# Patient Record
Sex: Male | Born: 1945 | Race: White | Hispanic: No | Marital: Married | Smoking: Never smoker
Health system: Southern US, Community
[De-identification: ages and names within clinical notes are randomized; demographics above are authoritative.]

## PROBLEM LIST (undated history)

## (undated) DIAGNOSIS — I428 Other cardiomyopathies: Secondary | ICD-10-CM

## (undated) DIAGNOSIS — L732 Hidradenitis suppurativa: Secondary | ICD-10-CM

## (undated) DIAGNOSIS — E039 Hypothyroidism, unspecified: Secondary | ICD-10-CM

## (undated) DIAGNOSIS — Z87438 Personal history of other diseases of male genital organs: Secondary | ICD-10-CM

## (undated) DIAGNOSIS — Z9884 Bariatric surgery status: Secondary | ICD-10-CM

## (undated) HISTORY — PX: ECHO DOPPLER COMPLETE(TRANSTHOR) (ARMC HX): HXRAD1288

## (undated) HISTORY — PX: CARDIOVASCULAR STRESS TEST: SHX262

## (undated) HISTORY — DX: Hidradenitis suppurativa: L73.2

## (undated) HISTORY — DX: Personal history of other diseases of male genital organs: Z87.438

## (undated) HISTORY — PX: THYROIDECTOMY: SHX17

## (undated) HISTORY — DX: Other cardiomyopathies: I42.8

## (undated) HISTORY — DX: Hypothyroidism, unspecified: E03.9

## (undated) HISTORY — PX: GASTRIC BYPASS: SHX52

## (undated) HISTORY — DX: Bariatric surgery status: Z98.84

---

## 2010-10-09 HISTORY — PX: HYDROCELE EXCISION: SHX482

## 2018-02-05 ENCOUNTER — Telehealth: Payer: Self-pay | Admitting: Cardiovascular Disease

## 2018-02-05 NOTE — Telephone Encounter (Signed)
Records rec from [facility]-placed in Chart Prep.

## 2018-03-11 DIAGNOSIS — I428 Other cardiomyopathies: Secondary | ICD-10-CM | POA: Insufficient documentation

## 2018-04-01 ENCOUNTER — Ambulatory Visit: Payer: Self-pay | Admitting: Cardiovascular Disease

## 2018-12-26 ENCOUNTER — Encounter: Payer: Self-pay | Admitting: Cardiovascular Disease

## 2018-12-27 ENCOUNTER — Encounter: Payer: Self-pay | Admitting: Cardiovascular Disease

## 2018-12-30 ENCOUNTER — Telehealth: Payer: Self-pay | Admitting: Cardiovascular Disease

## 2018-12-30 NOTE — Telephone Encounter (Signed)
Medical records requested from [facility]. 12/30/18 vlm

## 2019-01-01 ENCOUNTER — Telehealth: Payer: Self-pay

## 2019-01-01 NOTE — Telephone Encounter (Signed)
   Primary Cardiologist:  No primary care provider on file.   Patient contacted.  History reviewed.  No symptoms to suggest any unstable cardiac conditions.  Based on discussion, with current pandemic situation, we will be postponing this appointment for [name] with a plan for f/u in  wks or sooner if feasible/necessary.  If symptoms change, he has been instructed to contact our office.   Routing to C19 CANCEL pool for tracking (P CV DIV CV19 CANCEL - reason for visit "other.") and assigning priority (1 = 4-6 wks, 2 = 6-12 wks, 3 = >12 wks).   Jones Broom, CMA  01/01/2019 2:17 PM         .

## 2019-01-06 ENCOUNTER — Ambulatory Visit: Payer: Self-pay | Admitting: Cardiovascular Disease

## 2019-08-18 ENCOUNTER — Other Ambulatory Visit: Payer: Self-pay

## 2019-08-18 ENCOUNTER — Encounter: Payer: Self-pay | Admitting: Cardiovascular Disease

## 2019-08-18 ENCOUNTER — Ambulatory Visit (INDEPENDENT_AMBULATORY_CARE_PROVIDER_SITE_OTHER): Payer: Medicare Other | Admitting: Cardiovascular Disease

## 2019-08-18 VITALS — BP 102/58 | HR 71 | Ht 71.0 in | Wt 252.4 lb

## 2019-08-18 DIAGNOSIS — I428 Other cardiomyopathies: Secondary | ICD-10-CM | POA: Diagnosis not present

## 2019-08-18 DIAGNOSIS — I5022 Chronic systolic (congestive) heart failure: Secondary | ICD-10-CM | POA: Diagnosis not present

## 2019-08-18 MED ORDER — LISINOPRIL 10 MG PO TABS: 10.0000 mg | ORAL_TABLET | Freq: Every day | ORAL | 3 refills | Status: DC

## 2019-08-18 MED ORDER — CARVEDILOL 3.125 MG PO TABS: 3.1250 mg | ORAL_TABLET | Freq: Two times a day (BID) | ORAL | 3 refills | Status: DC

## 2019-08-18 NOTE — Progress Notes (Signed)
Cardiology Office Note:    Date:  08/18/2019   ID:  Steven Beard, DOB [birth year], MRN [id number]  PCP:  Patient, No Pcp Per  Cardiologist:    Electrophysiologist:  None   Referring MD: Roetta Sessions, MD   Chief Complaint  Patient presents with  . Congestive Heart Failure    History of Present Illness:    Steven Beard is a 73 y.o. male with a hx of nonischemic cardiomyopathy.  His ejection fraction is around 35% by echocardiogram in 2019.  He recently moved from [place] and is transferring cardiology care. We were asked to see him today for follow up of his CHF by Dr. Benetta Spar.   Also has borderline DM.   HbA1C is around 7 .   Able to do his normal activities.  He is not very active Does not exercise much at all .   Lets are very weak .  Has difficulty getting out of a chair without using his arms.   Has trouble when he stands for a while - gets weak, nauseated.     Past Medical History:  Diagnosis Date  . Hidradenitis   . History of BPH    benign prostatic hypertrophy  . History of hydrocele    in right testicle   . Hypothyroidism   . NICM (nonischemic cardiomyopathy) ([place])   . S/P gastric bypass     Past Surgical History:  Procedure Laterality Date  . CARDIOVASCULAR STRESS TEST     CARDIOLYTE ST LATE 2000's AT [facility] OFFICE REPORTEDLY NEGATIVE FOR ISCHEMIA  . ECHO DOPPLER COMPLETE(TRANSTHOR) ([facility] HX)     11/2010 HT ECHO W/COLORFLOW/SPECTRAL ([facility] HISTORICAL RESULT) LVEF 35%, GLOBAL HK, MILD MR/TR/AR, 2013 STABLE LVEF 35% GLOBAL HK, NO CHANGE C/W 2012, LVEF 40% WITH GLOBAL HK; TRIVIAL MR/AR, NO EFFUSION  . GASTRIC BYPASS     hx  . HYDROCELE EXCISION  2012   REPAIR    Current Medications: Current Meds  Medication Sig  . ALPRAZolam (XANAX) 1 MG tablet Take 1 tablet by mouth as needed.  [name] buPROPion (ZYBAN) 150 MG 12 hr tablet Take 150 mg by mouth 2 (two) times daily.  . carvedilol (COREG) 3.125 MG tablet Take 1 tablet (3.125 mg total) by mouth 2 (two) times  daily with a meal.  . finasteride (PROSCAR) 5 MG tablet Take 5 mg by mouth daily.  [name] levothyroxine (SYNTHROID, LEVOTHROID) 175 MCG tablet Take 175 mcg by mouth daily before breakfast.  . lisinopril (ZESTRIL) 10 MG tablet Take 1 tablet (10 mg total) by mouth daily.  . Multiple Vitamin (MULTIVITAMIN) tablet Take 1 tablet by mouth daily.  . Multiple Vitamins-Minerals (PRESERVISION AREDS) TABS Take by mouth.  . tamsulosin (FLOMAX) 0.4 MG CAPS capsule Take 0.4 mg by mouth daily.  . traZODone (DESYREL) 50 MG tablet Take 50 mg by mouth at bedtime.  . [DISCONTINUED] carvedilol (COREG) 3.125 MG tablet Take 3.125 mg by mouth 2 (two) times daily with a meal.  . [DISCONTINUED] lisinopril (PRINIVIL,ZESTRIL) 10 MG tablet Take 10 mg by mouth daily.     Allergies:   Minocycline and Codeine   Social History   Socioeconomic History  . Marital status: Married    Spouse name: Not on file  . Number of children: Not on file  . Years of education: Not on file  . Highest education level: Not on file  Occupational History  . Not on file  Social Needs  . Financial resource strain: Not on file  . Food insecurity  Worry: Not on file    Inability: Not on file  . Transportation needs    Medical: Not on file    Non-medical: Not on file  Tobacco Use  . Smoking status: Never Smoker  . Smokeless tobacco: Never Used  Substance and Sexual Activity  . Alcohol use: Yes    Frequency: Never  . Drug use: Never  . Sexual activity: Not on file  Lifestyle  . Physical activity    Days per week: Not on file    Minutes per session: Not on file  . Stress: Not on file  Relationships  . Social Herbalist on phone: Not on file    Gets together: Not on file    Attends religious service: Not on file    Active member of club or organization: Not on file    Attends meetings of clubs or organizations: Not on file    Relationship status: Not on file  Other Topics Concern  . Not on file  Social History  Narrative  . Not on file     Family History: The patient's family history includes Heart disease in his father and mother.  ROS:   Please see the history of present illness.     All other systems reviewed and are negative.  EKGs/Labs/Other Studies Reviewed:    The following studies were reviewed today:   EKG:  Nov. 9, 2020:   NSR at 81.   Voltage criteria for [facility]  Recent Labs: No results found for requested labs within last 8760 hours.  Recent Lipid Panel No results found for: CHOL, TRIG, HDL, CHOLHDL, VLDL, LDLCALC, LDLDIRECT  Physical Exam:    VS:  BP (!) 102/58   Pulse 71   Ht 5\' 11"  (1.803 m)   Wt 252 lb 6.4 oz (114.5 kg)   SpO2 97%   BMI 35.20 kg/m     Wt Readings from Last 3 Encounters:  08/18/19 252 lb 6.4 oz (114.5 kg)     GEN:   Elderly, moderately obese gentleman, no acute distress HEENT: Normal NECK: No JVD; No carotid bruits LYMPHATICS: No lymphadenopathy CARDIAC: RRR, no murmurs, rubs, gallops RESPIRATORY:  Clear to auscultation without rales, wheezing or rhonchi  ABDOMEN: Soft, non-tender, non-distended MUSCULOSKELETAL:  No edema; No deformity  SKIN: Warm and dry NEUROLOGIC:  Alert and oriented x 3 PSYCHIATRIC:  Normal affect   ASSESSMENT:    1. Chronic systolic heart failure ([place])   2. NICM (nonischemic cardiomyopathy) ([place])    PLAN:    In order of problems listed above:  1. Chronic systolic congestive heart failure: Steven Beard presents today for further evaluation management of his chronic systolic congestive heart failure.  He is on carvedilol, lisinopril.  He has some symptoms that may be concerning for orthostatic hypotension so I do not think that we can add any additional medications at this time.  He limits his salt intake.  He admits that he does not drink enough water and we discussed this at great length.  We will repeat his echocardiogram.  His last ejection fraction was 35% last year in [place], [place].  His last echocardiogram  showed an ejection fraction of 35%.  We will repeat his echocardiogram.  2.  Generalized deconditioning: He has trouble getting up out of a chair without using his arms.  We discussed the importance of being able to get up out of a chair or off the toilet independently.  We will refer him to the PREP  program at [facility].    We will see him again in 3 months for follow-up visit.   Medication Adjustments/Labs and Tests Ordered: Current medicines are reviewed at length with the patient today.  Concerns regarding medicines are outlined above.  Orders Placed This Encounter  Procedures  . Amb Referral To Provider Referral Exercise Program (P.R.E.P)  . EKG 12-Lead  . ECHOCARDIOGRAM COMPLETE   Meds ordered this encounter  Medications  . carvedilol (COREG) 3.125 MG tablet    Sig: Take 1 tablet (3.125 mg total) by mouth 2 (two) times daily with a meal.    Dispense:  180 tablet    Refill:  3  . lisinopril (ZESTRIL) 10 MG tablet    Sig: Take 1 tablet (10 mg total) by mouth daily.    Dispense:  90 tablet    Refill:  3    Patient Instructions  Medication Instructions:  Your physician recommends that you continue on your current medications as directed. Please refer to the Current Medication list given to you today.  *If you need a refill on your cardiac medications before your next appointment, please call your pharmacy*   Lab Work: Please Fax your lab results to [phone]    Testing/Procedures: Your physician has requested that you have an echocardiogram. Echocardiography is a painless test that uses sound waves to create images of your heart. It provides your doctor with information about the size and shape of your heart and how well your heart's chambers and valves are working. This procedure takes approximately one hour. There are no restrictions for this procedure.    Follow-Up: You have been referred to Provider Referral Exercise Program at the [facility] will receive a call  regarding admission into this program   At [facility], you and your health needs are our priority.  As part of our continuing mission to provide you with exceptional heart care, we have created designated Provider Care Teams.  These Care Teams include your primary Cardiologist (physician) and Advanced Practice Providers (APPs -  Physician Assistants and Nurse Practitioners) who all work together to provide you with the care you need, when you need it.  Your next appointment:   3 months  The format for your next appointment:   Either In Person or Virtual  Provider:   You may see Mertie Moores, MD or one of the following Advanced Practice Providers on your designated Care Team:    Richardson Dopp, PA-C  Vin [place], [place]  Daune Perch, [place]       Signed, Mertie Moores, MD  08/18/2019 6:08 PM    [place]

## 2019-08-18 NOTE — Patient Instructions (Addendum)
Medication Instructions:  Your physician recommends that you continue on your current medications as directed. Please refer to the Current Medication list given to you today.  *If you need a refill on your cardiac medications before your next appointment, please call your pharmacy*   Lab Work: Please Fax your lab results to [phone]    Testing/Procedures: Your physician has requested that you have an echocardiogram. Echocardiography is a painless test that uses sound waves to create images of your heart. It provides your doctor with information about the size and shape of your heart and how well your heart's chambers and valves are working. This procedure takes approximately one hour. There are no restrictions for this procedure.    Follow-Up: You have been referred to Provider Referral Exercise Program at the [facility] will receive a call regarding admission into this program   At [facility], you and your health needs are our priority.  As part of our continuing mission to provide you with exceptional heart care, we have created designated Provider Care Teams.  These Care Teams include your primary Cardiologist (physician) and Advanced Practice Providers (APPs -  Physician Assistants and Nurse Practitioners) who all work together to provide you with the care you need, when you need it.  Your next appointment:   3 months  The format for your next appointment:   Either In Person or Virtual  Provider:   You may see Mertie Moores, MD or one of the following Advanced Practice Providers on your designated Care Team:    Richardson Dopp, PA-C  [place], [place]  Daune Perch, [place]

## 2019-08-26 ENCOUNTER — Telehealth: Payer: Self-pay

## 2019-08-26 NOTE — Telephone Encounter (Signed)
Received referral for PREP. Patient has concerns about the rising numbers of COVID in the state. Prefers to wait to see what will happen first before committing. Did talk with patient about ways to be active in the home such as standing up from chair several times per day,walks and states MD did tell him to use stairs for exercise. Has a stationary bike. Offered a virtural class instead but declined. Agreeable to a phone call next week for accountability to movement.

## 2019-08-27 ENCOUNTER — Other Ambulatory Visit: Payer: Self-pay

## 2019-08-27 ENCOUNTER — Ambulatory Visit (HOSPITAL_COMMUNITY): Payer: Medicare Other | Attending: Cardiovascular Disease

## 2019-08-27 DIAGNOSIS — I5022 Chronic systolic (congestive) heart failure: Secondary | ICD-10-CM | POA: Insufficient documentation

## 2019-08-27 DIAGNOSIS — I428 Other cardiomyopathies: Secondary | ICD-10-CM | POA: Diagnosis present

## 2019-08-27 MED ORDER — PERFLUTREN LIPID MICROSPHERE
1.0000 mL | INTRAVENOUS | Status: AC | PRN
  Administered 2019-08-27: 3 mL via INTRAVENOUS

## 2019-08-28 ENCOUNTER — Encounter: Payer: Self-pay | Admitting: Cardiovascular Disease

## 2019-08-28 ENCOUNTER — Telehealth: Payer: Self-pay | Admitting: Nurse Practitioner

## 2019-08-28 DIAGNOSIS — I5022 Chronic systolic (congestive) heart failure: Secondary | ICD-10-CM

## 2019-08-28 MED ORDER — ENTRESTO 24-26 MG PO TABS: 1.0000 | ORAL_TABLET | Freq: Two times a day (BID) | ORAL | 11 refills | Status: DC

## 2019-08-28 NOTE — Telephone Encounter (Signed)
Spoke with patient and reviewed Dr. Elmarie Shiley advice. He agrees to stop lisinopril and start Entresto 36 hours later. Patient states he will monitor BP regularly and will come in for lab work on 12/15. I advised him to call in anytime with questions or concerns. Questions were answered to his satisfaction and he thanked me for the call.

## 2019-08-28 NOTE — Telephone Encounter (Signed)
-----   Message from Thayer Headings, MD sent at 08/27/2019  2:05 PM EST ----- LV function is unchanged from previous echo His reduced exertional capacity is likely due to deconditioning

## 2019-08-28 NOTE — Telephone Encounter (Signed)
Called patient to review echo results. He states his previous EF was 35-40% and it has not been this low in the past. He states cardiologist in [place] wanted to increase his carvedilol in the past but did not think his soft BP would tolerate. I advised that our advice would not be an increase in the BB but rather to try Entresto in the place of lisinopril. I answered his questions about the medication and advised that I will review with Dr. Acie Fredrickson and call him back. Patient verbalized understanding and agreement with plan. Discussed plan with Dr. Acie Fredrickson who agrees that patient should stop lisinopril and start Entresto 24-26 mg BID 36 hours later.  I left patient a message requesting a call back

## 2019-09-01 NOTE — Progress Notes (Signed)
Follow up after PREP referral Spoke with pt and wife today. Pt prefers to continue to exercise at home given COVID concerns. Is exercising (walking) every day.  Encouraged to keep the habit up as well as to reach out if he would like education supports or virtual exercise.  Will keep patient on list and reach back out once restrictions are lifted.

## 2019-09-23 ENCOUNTER — Other Ambulatory Visit: Payer: Self-pay

## 2019-09-23 ENCOUNTER — Other Ambulatory Visit: Payer: Medicare Other

## 2019-09-23 DIAGNOSIS — I5022 Chronic systolic (congestive) heart failure: Secondary | ICD-10-CM

## 2019-09-23 LAB — BASIC METABOLIC PANEL
BUN/Creatinine Ratio: 20 (ref 10–24)
BUN: 25 mg/dL (ref 8–27)
CO2: 24 mmol/L (ref 20–29)
Calcium: 9.3 mg/dL (ref 8.6–10.2)
Chloride: 102 mmol/L (ref 96–106)
Creatinine, Ser: 1.25 mg/dL (ref 0.76–1.27)
GFR calc Af Amer: 66 mL/min/{1.73_m2} (ref >59)
GFR calc non Af Amer: 57 mL/min/{1.73_m2} — ABNORMAL LOW (ref >59)
Glucose: 177 mg/dL — ABNORMAL HIGH (ref 65–99)
Potassium: 4.9 mmol/L (ref 3.5–5.2)
Sodium: 141 mmol/L (ref 134–144)

## 2019-11-01 ENCOUNTER — Ambulatory Visit: Payer: Medicare Other | Attending: Internal Medicine

## 2019-11-01 DIAGNOSIS — Z23 Encounter for immunization: Secondary | ICD-10-CM | POA: Insufficient documentation

## 2019-11-01 NOTE — Progress Notes (Signed)
   Covid-19 Vaccination Clinic  Name:  Steven Beard    MRN: [id number] DOB: 1946-10-06  11/01/2019  Steven Beard was observed post Covid-19 immunization for 15 minutes without incidence. He was provided with Vaccine Information Sheet and instruction to access the V-Safe system.   Steven Beard was instructed to call 911 with any severe reactions post vaccine: [name] Difficulty breathing  . Swelling of your face and throat  . A fast heartbeat  . A bad rash all over your body  . Dizziness and weakness    Immunizations Administered    Name Date Dose VIS Date Route   Pfizer COVID-19 Vaccine 11/01/2019  2:14 PM 0.3 mL 09/19/2019 Intramuscular   Manufacturer: [place]   Lot: [id number]   Gene Autry: [id number]

## 2019-11-18 ENCOUNTER — Ambulatory Visit (INDEPENDENT_AMBULATORY_CARE_PROVIDER_SITE_OTHER): Payer: Medicare Other | Admitting: Cardiovascular Disease

## 2019-11-18 ENCOUNTER — Encounter: Payer: Self-pay | Admitting: Cardiovascular Disease

## 2019-11-18 ENCOUNTER — Other Ambulatory Visit: Payer: Self-pay

## 2019-11-18 VITALS — BP 100/60 | HR 88 | Ht 71.0 in | Wt 253.0 lb

## 2019-11-18 DIAGNOSIS — I5042 Chronic combined systolic (congestive) and diastolic (congestive) heart failure: Secondary | ICD-10-CM

## 2019-11-18 DIAGNOSIS — I5022 Chronic systolic (congestive) heart failure: Secondary | ICD-10-CM | POA: Diagnosis not present

## 2019-11-18 DIAGNOSIS — I428 Other cardiomyopathies: Secondary | ICD-10-CM | POA: Diagnosis not present

## 2019-11-18 NOTE — Patient Instructions (Addendum)
Medication Instructions:  Your physician recommends that you continue on your current medications as directed. Please refer to the Current Medication list given to you today.  *If you need a refill on your cardiac medications before your next appointment, please call your pharmacy*  Lab Work: None Ordered If you have labs (blood work) drawn today and your tests are completely normal, you will receive your results only by: [name] MyChart Message (if you have MyChart) OR . A paper copy in the mail If you have any lab test that is abnormal or we need to change your treatment, we will call you to review the results.  Testing/Procedures: Your physician has requested that you have an echocardiogram on Monday April 19. Echocardiography is a painless test that uses sound waves to create images of your heart. It provides your doctor with information about the size and shape of your heart and how well your heart's chambers and valves are working. This procedure takes approximately one hour. There are no restrictions for this procedure.   Follow-Up: At [facility], you and your health needs are our priority.  As part of our continuing mission to provide you with exceptional heart care, we have created designated Provider Care Teams.  These Care Teams include your primary Cardiologist (physician) and Advanced Practice Providers (APPs -  Physician Assistants and Nurse Practitioners) who all work together to provide you with the care you need, when you need it.  Your next appointment:   2 month(s) on Wed. April 21 at 10:20 am  The format for your next appointment:   In Person  Provider:   Mertie Moores, MD

## 2019-11-18 NOTE — Progress Notes (Signed)
Cardiology Office Note:    Date:  11/18/2019   ID:  Steven Beard, DOB Apr 17, 1946, MRN [id number]  PCP:  Patient, No Pcp Per  Cardiologist:  Trenton Passow  Electrophysiologist:  None   Referring MD: No ref. provider found   No chief complaint on file.   History of Present Illness:    Steven Beard is a 74 y.o. male with a hx of nonischemic cardiomyopathy.  His ejection fraction is around 35% by echocardiogram in 2019.  He recently moved from [place] and is transferring cardiology care. We were asked to see him today for follow up of his CHF by Dr. Benetta Spar.   Also has borderline DM.   HbA1C is around 7 .   Able to do his normal activities.  He is not very active Does not exercise much at all .   Lets are very weak .  Has difficulty getting out of a chair without using his arms.   Has trouble when he stands for a while - gets weak, nauseated.   November 18, 2019:  [name] is seen today for follow-up of his congestive heart failure.  He is quite weak.  His blood pressure is very low today. We stopped his Lisinopril and started Entresto at his last visit.  Takes his BP on occasion Typical 110-120 / 60-70  Past Medical History:  Diagnosis Date  . Hidradenitis   . History of BPH    benign prostatic hypertrophy  . History of hydrocele    in right testicle   . Hypothyroidism   . NICM (nonischemic cardiomyopathy) ([place])   . S/P gastric bypass     Past Surgical History:  Procedure Laterality Date  . CARDIOVASCULAR STRESS TEST     CARDIOLYTE ST LATE 2000's AT [facility] OFFICE REPORTEDLY NEGATIVE FOR ISCHEMIA  . ECHO DOPPLER COMPLETE(TRANSTHOR) ([facility] HX)     11/2010 HT ECHO W/COLORFLOW/SPECTRAL ([facility] HISTORICAL RESULT) LVEF 35%, GLOBAL HK, MILD MR/TR/AR, 2013 STABLE LVEF 35% GLOBAL HK, NO CHANGE C/W 2012, LVEF 40% WITH GLOBAL HK; TRIVIAL MR/AR, NO EFFUSION  . GASTRIC BYPASS     hx  . HYDROCELE EXCISION  2012   REPAIR    Current Medications: Current Meds  Medication Sig  . ALPRAZolam  (XANAX) 1 MG tablet Take 1 tablet by mouth as needed.  [name] buPROPion (ZYBAN) 150 MG 12 hr tablet Take 150 mg by mouth 2 (two) times daily.  . carvedilol (COREG) 3.125 MG tablet Take 1 tablet (3.125 mg total) by mouth 2 (two) times daily with a meal.  . finasteride (PROSCAR) 5 MG tablet Take 5 mg by mouth daily.  [name] levothyroxine (SYNTHROID, LEVOTHROID) 175 MCG tablet Take 175 mcg by mouth daily before breakfast.  . Multiple Vitamin (MULTIVITAMIN) tablet Take 1 tablet by mouth daily.  . Multiple Vitamins-Minerals (PRESERVISION AREDS) TABS Take by mouth.  . sacubitril-valsartan (ENTRESTO) 24-26 MG Take 1 tablet by mouth 2 (two) times daily.  . tamsulosin (FLOMAX) 0.4 MG CAPS capsule Take 0.4 mg by mouth daily.  . traZODone (DESYREL) 50 MG tablet Take 50 mg by mouth at bedtime.     Allergies:   Minocycline and Codeine   Social History   Socioeconomic History  . Marital status: Married    Spouse name: Not on file  . Number of children: Not on file  . Years of education: Not on file  . Highest education level: Not on file  Occupational History  . Not on file  Tobacco Use  . Smoking status: Never Smoker  .  Smokeless tobacco: Never Used  Substance and Sexual Activity  . Alcohol use: Yes  . Drug use: Never  . Sexual activity: Not on file  Other Topics Concern  . Not on file  Social History Narrative  . Not on file   Social Determinants of Health   Financial Resource Strain:   . Difficulty of Paying Living Expenses: Not on file  Food Insecurity:   . Worried About Charity fundraiser in the Last Year: Not on file  . Ran Out of Food in the Last Year: Not on file  Transportation Needs:   . Lack of Transportation (Medical): Not on file  . Lack of Transportation (Non-Medical): Not on file  Physical Activity:   . Days of Exercise per Week: Not on file  . Minutes of Exercise per Session: Not on file  Stress:   . Feeling of Stress : Not on file  Social Connections:   . Frequency of  Communication with Friends and Family: Not on file  . Frequency of Social Gatherings with Friends and Family: Not on file  . Attends Religious Services: Not on file  . Active Member of Clubs or Organizations: Not on file  . Attends Archivist Meetings: Not on file  . Marital Status: Not on file     Family History: The patient's family history includes Heart disease in his father and mother.  ROS:   Please see the history of present illness.     All other systems reviewed and are negative.  EKGs/Labs/Other Studies Reviewed:    The following studies were reviewed today:     Recent Labs: 09/23/2019: BUN 25; Creatinine, Ser 1.25; Potassium 4.9; Sodium 141  Recent Lipid Panel No results found for: CHOL, TRIG, HDL, CHOLHDL, VLDL, LDLCALC, LDLDIRECT    Physical Exam: Blood pressure 100/60, pulse 88, height 5\' 11"  (1.803 m), weight 253 lb (114.8 kg), SpO2 96 %.  GEN:  Well nourished, well developed in no acute distress HEENT: Normal NECK: No JVD; No carotid bruits LYMPHATICS: No lymphadenopathy CARDIAC: RRR , no murmurs, rubs, gallops RESPIRATORY:  Clear to auscultation without rales, wheezing or rhonchi  ABDOMEN: Soft, non-tender, non-distended MUSCULOSKELETAL:  No edema; No deformity  SKIN: Warm and dry NEUROLOGIC:  Alert and oriented x 3  EKG:   ASSESSMENT:    1. NICM (nonischemic cardiomyopathy) ([place])   2. Chronic combined systolic and diastolic heart failure (H[facility])    PLAN:    In order of problems listed above:  1.  Chronic systolic congestive heart failure:   Is tolerating the entresto well BP is too low to consider increasing meds at this point I'll see him in 2 months , with echo. Consider increasing meds at that point   2.  Generalized deconditioning: has started walking  Doing well with ambulation, Agreed with his primary MD that he should do stairs whenever possible      Medication Adjustments/Labs and Tests Ordered: Current medicines are  reviewed at length with the patient today.  Concerns regarding medicines are outlined above.  Orders Placed This Encounter  Procedures  . ECHOCARDIOGRAM COMPLETE   No orders of the defined types were placed in this encounter.   Patient Instructions  Medication Instructions:  Your physician recommends that you continue on your current medications as directed. Please refer to the Current Medication list given to you today.  *If you need a refill on your cardiac medications before your next appointment, please call your pharmacy*  Lab Work: None  Ordered If you have labs (blood work) drawn today and your tests are completely normal, you will receive your results only by: [name] MyChart Message (if you have MyChart) OR . A paper copy in the mail If you have any lab test that is abnormal or we need to change your treatment, we will call you to review the results.  Testing/Procedures: Your physician has requested that you have an echocardiogram on Monday April 19. Echocardiography is a painless test that uses sound waves to create images of your heart. It provides your doctor with information about the size and shape of your heart and how well your heart's chambers and valves are working. This procedure takes approximately one hour. There are no restrictions for this procedure.   Follow-Up: At [facility], you and your health needs are our priority.  As part of our continuing mission to provide you with exceptional heart care, we have created designated Provider Care Teams.  These Care Teams include your primary Cardiologist (physician) and Advanced Practice Providers (APPs -  Physician Assistants and Nurse Practitioners) who all work together to provide you with the care you need, when you need it.  Your next appointment:   2 month(s) on Wed. April 21 at 10:20 am  The format for your next appointment:   In Person  Provider:   Mertie Moores, MD       Signed, Mertie Moores, MD    11/18/2019 12:43 PM    [place]

## 2019-11-22 ENCOUNTER — Ambulatory Visit: Payer: Medicare Other | Attending: Internal Medicine

## 2019-11-22 DIAGNOSIS — Z23 Encounter for immunization: Secondary | ICD-10-CM | POA: Insufficient documentation

## 2019-11-22 NOTE — Progress Notes (Signed)
   Covid-19 Vaccination Clinic  Name:  Steven Beard    MRN: [id number] DOB: Jul 07, 1946  11/22/2019  [name] was observed post Covid-19 immunization for 15 minutes without incidence. He was provided with Vaccine Information Sheet and instruction to access the V-Safe system.   [name] was instructed to call 911 with any severe reactions post vaccine: [name] Difficulty breathing  . Swelling of your face and throat  . A fast heartbeat  . A bad rash all over your body  . Dizziness and weakness    Immunizations Administered    Name Date Dose VIS Date Route   Pfizer COVID-19 Vaccine 11/22/2019  1:05 PM 0.3 mL 09/19/2019 Intramuscular   Manufacturer: [place]   Lot: [id number]   [place]: [id number]

## 2020-01-26 ENCOUNTER — Ambulatory Visit (HOSPITAL_COMMUNITY): Payer: Medicare Other | Attending: Cardiovascular Disease

## 2020-01-26 ENCOUNTER — Other Ambulatory Visit: Payer: Self-pay

## 2020-01-26 DIAGNOSIS — I428 Other cardiomyopathies: Secondary | ICD-10-CM | POA: Diagnosis not present

## 2020-01-26 DIAGNOSIS — I5042 Chronic combined systolic (congestive) and diastolic (congestive) heart failure: Secondary | ICD-10-CM | POA: Insufficient documentation

## 2020-01-28 ENCOUNTER — Encounter: Payer: Self-pay | Admitting: *Deleted

## 2020-01-28 ENCOUNTER — Other Ambulatory Visit: Payer: Self-pay

## 2020-01-28 ENCOUNTER — Encounter: Payer: Self-pay | Admitting: Cardiovascular Disease

## 2020-01-28 ENCOUNTER — Ambulatory Visit (INDEPENDENT_AMBULATORY_CARE_PROVIDER_SITE_OTHER): Payer: Medicare Other | Admitting: Cardiovascular Disease

## 2020-01-28 VITALS — BP 116/82 | HR 74 | Ht 71.0 in | Wt 252.2 lb

## 2020-01-28 DIAGNOSIS — I5022 Chronic systolic (congestive) heart failure: Secondary | ICD-10-CM | POA: Diagnosis not present

## 2020-01-28 DIAGNOSIS — I428 Other cardiomyopathies: Secondary | ICD-10-CM | POA: Diagnosis not present

## 2020-01-28 DIAGNOSIS — I5042 Chronic combined systolic (congestive) and diastolic (congestive) heart failure: Secondary | ICD-10-CM | POA: Diagnosis not present

## 2020-01-28 MED ORDER — SPIRONOLACTONE 25 MG PO TABS: 12.5000 mg | ORAL_TABLET | Freq: Every day | ORAL | 3 refills | Status: DC

## 2020-01-28 NOTE — Patient Instructions (Signed)
Medication Instructions:  1) START Spironolactone 12.5mg  once daily  *If you need a refill on your cardiac medications before your next appointment, please call your pharmacy*   Lab Work: BMET in 2 weeks   If you have labs (blood work) drawn today and your tests are completely normal, you will receive your results only by: [name] MyChart Message (if you have MyChart) OR . A paper copy in the mail If you have any lab test that is abnormal or we need to change your treatment, we will call you to review the results.   Testing/Procedures: Your physician has requested that you have a lexiscan myoview. For further information please visit [email or web address]. Please follow instruction sheet, as given.    Follow-Up: At [facility], you and your health needs are our priority.  As part of our continuing mission to provide you with exceptional heart care, we have created designated Provider Care Teams.  These Care Teams include your primary Cardiologist (physician) and Advanced Practice Providers (APPs -  Physician Assistants and Nurse Practitioners) who all work together to provide you with the care you need, when you need it.  We recommend signing up for the patient portal called "MyChart".  Sign up information is provided on this After Visit Summary.  MyChart is used to connect with patients for Virtual Visits (Telemedicine).  Patients are able to view lab/test results, encounter notes, upcoming appointments, etc.  Non-urgent messages can be sent to your provider as well.   To learn more about what you can do with MyChart, go to [email or web address].    Your next appointment:   4 month(s)  The format for your next appointment:   In Person  Provider:   You may see Mertie Moores, MD or one of the following Advanced Practice Providers on your designated Care Team:    Richardson Dopp, PA-C  [place], [place]  Daune Perch, NP    Other Instructions  You have been referred to the CHF  clinic.

## 2020-01-28 NOTE — Progress Notes (Signed)
Cardiology Office Note:    Date:  01/28/2020   ID:  Steven Beard, DOB 01/05/1946, MRN [id number]  PCP:  Patient, No Pcp Per  Cardiologist:  Nahser  Electrophysiologist:  None   Referring MD: No ref. provider found   Chief Complaint  Patient presents with  . Congestive Heart Failure    History of Present Illness:    Steven Beard is a 74 y.o. male with a hx of nonischemic cardiomyopathy.  His ejection fraction is around 35% by echocardiogram in 2019.  He recently moved from [place] and is transferring cardiology care. We were asked to see him today for follow up of his CHF by Dr. Benetta Spar.   Also has borderline DM.   HbA1C is around 7 .   Able to do his normal activities.  He is not very active Does not exercise much at all .   Lets are very weak .  Has difficulty getting out of a chair without using his arms.   Has trouble when he stands for a while - gets weak, nauseated.   November 18, 2019:  [name] is seen today for follow-up of his congestive heart failure.  He is quite weak.  His blood pressure is very low today. We stopped his Lisinopril and started Entresto at his last visit.  Takes his BP on occasion Typical 110-120 / 60-70  January 28, 2020:  [name] is seen today for follow-up of his congestive heart failure.  He was seen several months ago for a follow-up after initiating Entresto.  His blood pressure was too low to uptitrate any of his medications at that time. Echo is unchanged from previous EF Is waking more , legs are getting stronger   We discussed CHF clinic We discussed possible ICD placement   Past Medical History:  Diagnosis Date  . Hidradenitis   . History of BPH    benign prostatic hypertrophy  . History of hydrocele    in right testicle   . Hypothyroidism   . NICM (nonischemic cardiomyopathy) ([place])   . S/P gastric bypass     Past Surgical History:  Procedure Laterality Date  . CARDIOVASCULAR STRESS TEST     CARDIOLYTE ST LATE 2000's AT [facility]  OFFICE REPORTEDLY NEGATIVE FOR ISCHEMIA  . ECHO DOPPLER COMPLETE(TRANSTHOR) ([facility] HX)     11/2010 HT ECHO W/COLORFLOW/SPECTRAL ([facility] HISTORICAL RESULT) LVEF 35%, GLOBAL HK, MILD MR/TR/AR, 2013 STABLE LVEF 35% GLOBAL HK, NO CHANGE C/W 2012, LVEF 40% WITH GLOBAL HK; TRIVIAL MR/AR, NO EFFUSION  . GASTRIC BYPASS     hx  . HYDROCELE EXCISION  2012   REPAIR    Current Medications: Current Meds  Medication Sig  . ALPRAZolam (XANAX) 1 MG tablet Take 1 tablet by mouth as needed.  [name] buPROPion (ZYBAN) 150 MG 12 hr tablet Take 150 mg by mouth 2 (two) times daily.  . carvedilol (COREG) 3.125 MG tablet Take 1 tablet (3.125 mg total) by mouth 2 (two) times daily with a meal.  . finasteride (PROSCAR) 5 MG tablet Take 5 mg by mouth daily.  [name] levothyroxine (SYNTHROID, LEVOTHROID) 175 MCG tablet Take 175 mcg by mouth daily before breakfast.  . Multiple Vitamin (MULTIVITAMIN) tablet Take 1 tablet by mouth daily.  . Multiple Vitamins-Minerals (PRESERVISION AREDS) TABS Take by mouth.  . sacubitril-valsartan (ENTRESTO) 24-26 MG Take 1 tablet by mouth 2 (two) times daily.  . tamsulosin (FLOMAX) 0.4 MG CAPS capsule Take 0.4 mg by mouth daily.  . traZODone (DESYREL) 50 MG tablet Take  50 mg by mouth at bedtime.     Allergies:   Minocycline and Codeine   Social History   Socioeconomic History  . Marital status: Married    Spouse name: Not on file  . Number of children: Not on file  . Years of education: Not on file  . Highest education level: Not on file  Occupational History  . Not on file  Tobacco Use  . Smoking status: Never Smoker  . Smokeless tobacco: Never Used  Substance and Sexual Activity  . Alcohol use: Yes  . Drug use: Never  . Sexual activity: Not on file  Other Topics Concern  . Not on file  Social History Narrative  . Not on file   Social Determinants of Health   Financial Resource Strain:   . Difficulty of Paying Living Expenses:   Food Insecurity:   . Worried About Ship broker in the Last Year:   . Arboriculturist in the Last Year:   Transportation Needs:   . Film/video editor (Medical):   [name] Lack of Transportation (Non-Medical):   Physical Activity:   . Days of Exercise per Week:   . Minutes of Exercise per Session:   Stress:   . Feeling of Stress :   Social Connections:   . Frequency of Communication with Friends and Family:   . Frequency of Social Gatherings with Friends and Family:   . Attends Religious Services:   . Active Member of Clubs or Organizations:   . Attends Archivist Meetings:   [name] Marital Status:      Family History: The patient's family history includes Heart disease in his father and mother.  ROS:   Please see the history of present illness.     All other systems reviewed and are negative.  EKGs/Labs/Other Studies Reviewed:    The following studies were reviewed today:     Recent Labs: 09/23/2019: BUN 25; Creatinine, Ser 1.25; Potassium 4.9; Sodium 141  Recent Lipid Panel No results found for: CHOL, TRIG, HDL, CHOLHDL, VLDL, LDLCALC, LDLDIRECT    Physical Exam: Blood pressure 116/82, pulse 74, height 5\' 11"  (1.803 m), weight 252 lb 3.2 oz (114.4 kg), SpO2 97 %.  GEN:  Middle age male,  NAD , moderately obese HEENT: Normal NECK: No JVD; No carotid bruits LYMPHATICS: No lymphadenopathy CARDIAC: RRR , no murmurs, rubs, gallops RESPIRATORY:  Clear to auscultation without rales, wheezing or rhonchi  ABDOMEN: Soft, non-tender, non-distended MUSCULOSKELETAL:  No edema; No deformity  SKIN: Warm and dry NEUROLOGIC:  Alert and oriented x 3   EKG:   ASSESSMENT:    1. Chronic combined systolic and diastolic heart failure (HCC)    PLAN:    In order of problems listed above:  1.  Chronic systolic congestive heart failure:     His ejection fraction has not really changed despite being on Entresto.  I am not sure that we will be able to increase his Delene Loll although it much because of his  hypertension.  We will try adding spironolactone 12.5 mg a day.  Continue carvedilol 3.125 mg twice a day.  He is not had any angina but I think will be important to repeat his stress test to make sure he does not have underlying coronary artery disease.  We will refer him to the advanced heart failure clinic to see if they have any other contributions. We will also need to consider referral to the electrophysiology for placement of  an ICD if his EF does not improve.  I will see him in 4 months .  He'll likely be seen at least 1-2 times by the CHF team in the meantime.   2.  Generalized deconditioning:   He is ambulating more.  He seems to be getting stronger.   Medication Adjustments/Labs and Tests Ordered: Current medicines are reviewed at length with the patient today.  Concerns regarding medicines are outlined above.  Orders Placed This Encounter  Procedures  . Basic metabolic panel  . AMB referral to CHF clinic  . MYOCARDIAL PERFUSION IMAGING   Meds ordered this encounter  Medications  . spironolactone (ALDACTONE) 25 MG tablet    Sig: Take 0.5 tablets (12.5 mg total) by mouth daily.    Dispense:  45 tablet    Refill:  3    Patient Instructions  Medication Instructions:  1) START Spironolactone 12.5mg  once daily  *If you need a refill on your cardiac medications before your next appointment, please call your pharmacy*   Lab Work: BMET in 2 weeks   If you have labs (blood work) drawn today and your tests are completely normal, you will receive your results only by: [name] MyChart Message (if you have MyChart) OR . A paper copy in the mail If you have any lab test that is abnormal or we need to change your treatment, we will call you to review the results.   Testing/Procedures: Your physician has requested that you have a lexiscan myoview. For further information please visit [email or web address]. Please follow instruction sheet, as given.    Follow-Up: At [facility],  you and your health needs are our priority.  As part of our continuing mission to provide you with exceptional heart care, we have created designated Provider Care Teams.  These Care Teams include your primary Cardiologist (physician) and Advanced Practice Providers (APPs -  Physician Assistants and Nurse Practitioners) who all work together to provide you with the care you need, when you need it.  We recommend signing up for the patient portal called "MyChart".  Sign up information is provided on this After Visit Summary.  MyChart is used to connect with patients for Virtual Visits (Telemedicine).  Patients are able to view lab/test results, encounter notes, upcoming appointments, etc.  Non-urgent messages can be sent to your provider as well.   To learn more about what you can do with MyChart, go to [email or web address].    Your next appointment:   4 month(s)  The format for your next appointment:   In Person  Provider:   You may see Mertie Moores, MD or one of the following Advanced Practice Providers on your designated Care Team:    Richardson Dopp, PA-C  [place], [place]  Daune Perch, NP    Other Instructions  You have been referred to the CHF clinic.     Signed, Mertie Moores, MD  01/28/2020 10:45 AM    [place]

## 2020-02-03 ENCOUNTER — Telehealth (HOSPITAL_COMMUNITY): Payer: Self-pay | Admitting: *Deleted

## 2020-02-03 NOTE — Telephone Encounter (Signed)
Patient given detailed instructions per Myocardial Perfusion Study Information Sheet for the test on 02/11/2020 at 0800. Patient notified to arrive 15 minutes early and that it is imperative to arrive on time for appointment to keep from having the test rescheduled.  If you need to cancel or reschedule your appointment, please call the office within 24 hours of your appointment. . Patient verbalized understanding.Steven Beard, [place] Patient does not use mychart

## 2020-02-10 ENCOUNTER — Telehealth: Payer: Self-pay | Admitting: Cardiovascular Disease

## 2020-02-10 NOTE — Telephone Encounter (Signed)
PT called back asking about his test tomorrow. Call was transferred to [facility]

## 2020-02-10 NOTE — Telephone Encounter (Signed)
Reviewed instructions for myocardial perfusion test that patient has scheduled for tomorrow. I answered questions to his satisfaction and he verbalized understanding.   Additionally, patient reports increased weakness and nausea when he wakes up in the morning prior to taking medications since starting spironolactone. He reports he recently lost 6 lbs and has decreased his food intake. I asked about BP readings and he reports one reading of SBP <100 mmHg. I advised that he may try taking the spironolactone 12.5 mg at lunch to see if he has less symptoms in the mornings. I advised him also to monitor BP regularly so that we can have that information for future advice. He verbalized understanding and agreement with plan and thanked me for the call.

## 2020-02-10 NOTE — Telephone Encounter (Signed)
New Message  Pt called and had questions regarding his Myocardial Purfusion he's having tomorrow  Please call to discuss

## 2020-02-11 ENCOUNTER — Other Ambulatory Visit: Payer: Medicare Other | Admitting: *Deleted

## 2020-02-11 ENCOUNTER — Ambulatory Visit (HOSPITAL_COMMUNITY): Payer: Medicare Other | Attending: Cardiovascular Disease

## 2020-02-11 ENCOUNTER — Other Ambulatory Visit: Payer: Self-pay

## 2020-02-11 DIAGNOSIS — I5042 Chronic combined systolic (congestive) and diastolic (congestive) heart failure: Secondary | ICD-10-CM

## 2020-02-11 LAB — BASIC METABOLIC PANEL
BUN/Creatinine Ratio: 20 (ref 10–24)
BUN: 23 mg/dL (ref 8–27)
CO2: 26 mmol/L (ref 20–29)
Calcium: 9.5 mg/dL (ref 8.6–10.2)
Chloride: 101 mmol/L (ref 96–106)
Creatinine, Ser: 1.14 mg/dL (ref 0.76–1.27)
GFR calc Af Amer: 73 mL/min/{1.73_m2} (ref >59)
GFR calc non Af Amer: 63 mL/min/{1.73_m2} (ref >59)
Glucose: 147 mg/dL — ABNORMAL HIGH (ref 65–99)
Potassium: 4.9 mmol/L (ref 3.5–5.2)
Sodium: 139 mmol/L (ref 134–144)

## 2020-02-11 LAB — MYOCARDIAL PERFUSION IMAGING
LV dias vol: 84 mL (ref 62–150)
LV sys vol: 57 mL
Peak HR: 90 {beats}/min
Rest HR: 71 {beats}/min
SDS: 3
SRS: 1
SSS: 4
TID: 0.93

## 2020-02-11 MED ORDER — TECHNETIUM TC 99M TETROFOSMIN IV KIT
9.6000 | PACK | Freq: Once | INTRAVENOUS | Status: AC | PRN
  Administered 2020-02-11: 9.6 via INTRAVENOUS
  Filled 2020-02-11: qty 10

## 2020-02-11 MED ORDER — TECHNETIUM TC 99M TETROFOSMIN IV KIT
31.6000 | PACK | Freq: Once | INTRAVENOUS | Status: AC | PRN
  Administered 2020-02-11: 31.6 via INTRAVENOUS
  Filled 2020-02-11: qty 32

## 2020-02-11 MED ORDER — REGADENOSON 0.4 MG/5ML IV SOLN
0.4000 mg | Freq: Once | INTRAVENOUS | Status: AC
  Administered 2020-02-11: 0.4 mg via INTRAVENOUS

## 2020-05-11 ENCOUNTER — Encounter: Payer: Self-pay | Admitting: Cardiovascular Disease

## 2020-05-11 ENCOUNTER — Ambulatory Visit (INDEPENDENT_AMBULATORY_CARE_PROVIDER_SITE_OTHER): Payer: Medicare Other | Admitting: Cardiovascular Disease

## 2020-05-11 ENCOUNTER — Other Ambulatory Visit: Payer: Self-pay

## 2020-05-11 VITALS — BP 95/56 | HR 78 | Ht 71.0 in | Wt 243.8 lb

## 2020-05-11 DIAGNOSIS — I5042 Chronic combined systolic (congestive) and diastolic (congestive) heart failure: Secondary | ICD-10-CM

## 2020-05-11 LAB — BASIC METABOLIC PANEL
BUN/Creatinine Ratio: 23 (ref 10–24)
BUN: 27 mg/dL (ref 8–27)
CO2: 24 mmol/L (ref 20–29)
Calcium: 8.7 mg/dL (ref 8.6–10.2)
Chloride: 105 mmol/L (ref 96–106)
Creatinine, Ser: 1.16 mg/dL (ref 0.76–1.27)
GFR calc Af Amer: 71 mL/min/{1.73_m2} (ref >59)
GFR calc non Af Amer: 62 mL/min/{1.73_m2} (ref >59)
Glucose: 151 mg/dL — ABNORMAL HIGH (ref 65–99)
Potassium: 5.2 mmol/L (ref 3.5–5.2)
Sodium: 140 mmol/L (ref 134–144)

## 2020-05-11 LAB — CBC
Hematocrit: 36.6 % — ABNORMAL LOW (ref 37.5–51.0)
Hemoglobin: 11.9 g/dL — ABNORMAL LOW (ref 13.0–17.7)
MCH: 29.8 pg (ref 26.6–33.0)
MCHC: 32.5 g/dL (ref 31.5–35.7)
MCV: 92 fL (ref 79–97)
Platelets: 345 10*3/uL (ref 150–450)
RBC: 4 x10E6/uL — ABNORMAL LOW (ref 4.14–5.80)
RDW: 12.6 % (ref 11.6–15.4)
WBC: 7.6 10*3/uL (ref 3.4–10.8)

## 2020-05-11 MED ORDER — DIGOXIN 125 MCG PO TABS
0.1250 mg | ORAL_TABLET | Freq: Every day | ORAL | 3 refills | Status: DC
Start: 2020-05-11 — End: 2020-06-30

## 2020-05-11 NOTE — H&P (View-Only) (Signed)
Cardiology Office Note:    Date:  05/11/2020   ID:  Steven Beard, DOB 1946/02/23, MRN [id number]  PCP:  Patient, No Pcp Per  Cardiologist:  Mariacristina Aday  Electrophysiologist:  None   Referring MD: No ref. provider found   Chief Complaint  Patient presents with  . Congestive Heart Failure    History of Present Illness:    Steven Beard is a 74 y.o. male with a hx of nonischemic cardiomyopathy.  His ejection fraction is around 35% by echocardiogram in 2019.  He recently moved from [place] and is transferring cardiology care. We were asked to see him today for follow up of his CHF by Dr. Benetta Spar.   Also has borderline DM.   HbA1C is around 7 .   Able to do his normal activities.  He is not very active Does not exercise much at all .   Lets are very weak .  Has difficulty getting out of a chair without using his arms.   Has trouble when he stands for a while - gets weak, nauseated.   November 18, 2019:  [name] is seen today for follow-up of his congestive heart failure.  He is quite weak.  His blood pressure is very low today. We stopped his Lisinopril and started Entresto at his last visit.  Takes his BP on occasion Typical 110-120 / 60-70  January 28, 2020:  [name] is seen today for follow-up of his congestive heart failure.  He was seen several months ago for a follow-up after initiating Entresto.  His blood pressure was too low to uptitrate any of his medications at that time. Echo is unchanged from previous EF Is waking more , legs are getting stronger   We discussed CHF clinic We discussed possible ICD placement   May 11, 2020: Steven Beard is seen today for follow-up of his congestive heart failure.  He has been on Entresto but despite that his ejection fraction has not changed.  We referred him to the advanced heart failure clinic.  His appointment is scheduled for September 30.  He is still not feeling well  - weakness and fatigue.   No CP ,  Chronic DOE especially waking up  hills myoview in May 2021 shows no ischemia or infarction   Low risk  lprimary MD is [name] in [place].   Past Medical History:  Diagnosis Date  . Hidradenitis   . History of BPH    benign prostatic hypertrophy  . History of hydrocele    in right testicle   . Hypothyroidism   . NICM (nonischemic cardiomyopathy) ([place])   . S/P gastric bypass     Past Surgical History:  Procedure Laterality Date  . CARDIOVASCULAR STRESS TEST     CARDIOLYTE ST LATE 2000's AT [facility] OFFICE REPORTEDLY NEGATIVE FOR ISCHEMIA  . ECHO DOPPLER COMPLETE(TRANSTHOR) ([facility] HX)     11/2010 HT ECHO W/COLORFLOW/SPECTRAL ([facility] HISTORICAL RESULT) LVEF 35%, GLOBAL HK, MILD MR/TR/AR, 2013 STABLE LVEF 35% GLOBAL HK, NO CHANGE C/W 2012, LVEF 40% WITH GLOBAL HK; TRIVIAL MR/AR, NO EFFUSION  . GASTRIC BYPASS     hx  . HYDROCELE EXCISION  2012   REPAIR    Current Medications: Current Meds  Medication Sig  . ALPRAZolam (XANAX) 1 MG tablet Take 0.5 mg by mouth at bedtime as needed for anxiety.   [name] buPROPion (WELLBUTRIN SR) 150 MG 12 hr tablet Take 150 mg by mouth 2 (two) times daily.   . carvedilol (COREG) 3.125 MG tablet Take 1  tablet (3.125 mg total) by mouth 2 (two) times daily with a meal.  . finasteride (PROSCAR) 5 MG tablet Take 5 mg by mouth daily.  [name] levothyroxine (SYNTHROID, LEVOTHROID) 175 MCG tablet Take 175 mcg by mouth daily before breakfast.  . Multiple Vitamins-Minerals (PRESERVISION AREDS) TABS Take 1 tablet by mouth 2 (two) times daily.   . sacubitril-valsartan (ENTRESTO) 24-26 MG Take 1 tablet by mouth 2 (two) times daily.  [name] spironolactone (ALDACTONE) 25 MG tablet Take 0.5 tablets (12.5 mg total) by mouth daily.  . tamsulosin (FLOMAX) 0.4 MG CAPS capsule Take 0.4 mg by mouth daily.  . traZODone (DESYREL) 50 MG tablet Take 50 mg by mouth at bedtime.  . [DISCONTINUED] buPROPion (ZYBAN) 150 MG 12 hr tablet Take 150 mg by mouth 2 (two) times daily.  . [DISCONTINUED] Multiple Vitamin (MULTIVITAMIN)  tablet Take 1 tablet by mouth daily.     Allergies:   Minocycline and Codeine   Social History   Socioeconomic History  . Marital status: Married    Spouse name: Not on file  . Number of children: Not on file  . Years of education: Not on file  . Highest education level: Not on file  Occupational History  . Not on file  Tobacco Use  . Smoking status: Never Smoker  . Smokeless tobacco: Never Used  Vaping Use  . Vaping Use: Never used  Substance and Sexual Activity  . Alcohol use: Yes  . Drug use: Never  . Sexual activity: Not on file  Other Topics Concern  . Not on file  Social History Narrative  . Not on file   Social Determinants of Health   Financial Resource Strain:   . Difficulty of Paying Living Expenses:   Food Insecurity:   . Worried About Charity fundraiser in the Last Year:   . Arboriculturist in the Last Year:   Transportation Needs:   . Film/video editor (Medical):   [name] Lack of Transportation (Non-Medical):   Physical Activity:   . Days of Exercise per Week:   . Minutes of Exercise per Session:   Stress:   . Feeling of Stress :   Social Connections:   . Frequency of Communication with Friends and Family:   . Frequency of Social Gatherings with Friends and Family:   . Attends Religious Services:   . Active Member of Clubs or Organizations:   . Attends Archivist Meetings:   [name] Marital Status:      Family History: The patient's family history includes Heart disease in his father and mother.  ROS:   Please see the history of present illness.     All other systems reviewed and are negative.  EKGs/Labs/Other Studies Reviewed:    The following studies were reviewed today:     Recent Labs: 05/11/2020: BUN 27; Creatinine, Ser 1.16; Hemoglobin WILL FOLLOW; Platelets WILL FOLLOW; Potassium 5.2; Sodium 140  Recent Lipid Panel No results found for: CHOL, TRIG, HDL, CHOLHDL, VLDL, LDLCALC, LDLDIRECT    Physical Exam: Blood pressure  (!) 95/56, pulse 78, height 5\' 11"  (1.803 m), weight 243 lb 12.8 oz (110.6 kg), SpO2 97 %.  GEN:   Elderly male,  modertely obese HEENT: Normal NECK: No JVD; No carotid bruits LYMPHATICS: No lymphadenopathy CARDIAC: RRR , no murmurs, rubs, gallops RESPIRATORY:  Clear to auscultation without rales, wheezing or rhonchi  ABDOMEN: Soft, non-tender, non-distended MUSCULOSKELETAL:  No edema; No deformity  SKIN: Warm and dry NEUROLOGIC:  Alert  and oriented x 3   EKG:   ASSESSMENT:    1. Chronic combined systolic and diastolic heart failure (HCC)    PLAN:    In order of problems listed above:  1.  Chronic systolic congestive heart failure:      His ejection fraction has not improved on Entresto and Aldactone.  I would like to add digoxin 0.25 mg a day.  He had a negative Myoview study but I would like to do a right left heart catheterization since it does not make any sense that his EF did not improve on maximal medical therapy.  His blood pressure is low and we cannot increase his medications at this time.  I discussed the risks, benefits, options of heart catheterization.  He understands and agrees to proceed.  He does have a referral to the CHF clinic in September.  2.  Generalized deconditioning: We will refer him [facility].  He may need to have a formal physical therapy consult but I will defer to his primary medical doctor.     Medication Adjustments/Labs and Tests Ordered: Current medicines are reviewed at length with the patient today.  Concerns regarding medicines are outlined above.  Orders Placed This Encounter  Procedures  . Basic metabolic panel  . CBC   Meds ordered this encounter  Medications  . digoxin (LANOXIN) 0.125 MG tablet    Sig: Take 1 tablet (0.125 mg total) by mouth daily.    Dispense:  90 tablet    Refill:  3     Patient Instructions  Medication Instructions:  Your physician has recommended you make the following change in  your medication:  1) START taking digoxin 0.125 mg daily   *If you need a refill on your cardiac medications before your next appointment, please call your pharmacy*  Lab Work: TODAY: BMET and CBC  If you have labs (blood work) drawn today and your tests are completely normal, you will receive your results only by: [name] MyChart Message (if you have MyChart) OR . A paper copy in the mail If you have any lab test that is abnormal or we need to change your treatment, we will call you to review the results.   Testing/Procedures: Your physician has requested that you have a cardiac catheterization. Cardiac catheterization is used to diagnose and/or treat various heart conditions. Doctors may recommend this procedure for a number of different reasons. The most common reason is to evaluate chest pain. Chest pain can be a symptom of coronary artery disease (CAD), and cardiac catheterization can show whether plaque is narrowing or blocking your heart's arteries. This procedure is also used to evaluate the valves, as well as measure the blood flow and oxygen levels in different parts of your heart. For further information please visit HugeFiesta.tn. Please follow instruction sheet, as given.   Follow-Up: At [facility], you and your health needs are our priority.  As part of our continuing mission to provide you with exceptional heart care, we have created designated Provider Care Teams.  These Care Teams include your primary Cardiologist (physician) and Advanced Practice Providers (APPs -  Physician Assistants and Nurse Practitioners) who all work together to provide you with the care you need, when you need it.  Your next appointment:   3 month(s)  The format for your next appointment:   In Person  Provider:   You may see Mertie Moores, MD or one of the following Advanced Practice Providers on  your designated Care Team:    Melina Copa, PA-C  Ermalinda Barrios, PA-C    Other  Instructions    [place] OFFICE [place], [place] [address] Dept: [phone] Loc: [phone]  Noland Pizano  05/11/2020  You are scheduled for a Cardiac Catheterization on Wednesday, August 11 with Dr. Larae Grooms.  1. Please arrive at the [facility] (Main Entrance A) at [facility]: [address] [place], [place] [address] at 6:30 AM (This time is two hours before your procedure to ensure your preparation). Free valet parking service is available.   Special note: Every effort is made to have your procedure done on time. Please understand that emergencies sometimes delay scheduled procedures.  2. Diet: Do not eat solid foods after midnight.  The patient may have clear liquids until 5am upon the day of the procedure.  3. Labs: You will need to have blood drawn on Tuesday, August 3 at [facility] at [facility] [address]  Open: 7:30am - 5pm    Phone: [phone]. You do not need to be fasting.  4. COVID test on Monday, August 9th, 2021 at 2:15 pm at [address] [place],  [address]  4. Medication instructions in preparation for your procedure:   Contrast Allergy: No  On the morning of your procedure, take your Aspirin and any morning medicines NOT listed above.  You may use sips of water.  5. Plan for one night stay--bring personal belongings. 6. Bring a current list of your medications and current insurance cards. 7. You MUST have a responsible person to drive you home. 8. Someone MUST be with you the first 24 hours after you arrive home or your discharge will be delayed. 9. Please wear clothes that are easy to get on and off and wear slip-on shoes.  Thank you for allowing [place] to care for you!   -- [facility] Invasive Cardiovascular services      Signed, Mertie Moores, MD  05/11/2020 5:31 PM    [place]

## 2020-05-11 NOTE — Patient Instructions (Addendum)
Medication Instructions:  Your physician has recommended you make the following change in your medication:  1) START taking digoxin 0.125 mg daily   *If you need a refill on your cardiac medications before your next appointment, please call your pharmacy*  Lab Work: TODAY: BMET and CBC  If you have labs (blood work) drawn today and your tests are completely normal, you will receive your results only by: [name] MyChart Message (if you have MyChart) OR . A paper copy in the mail If you have any lab test that is abnormal or we need to change your treatment, we will call you to review the results.   Testing/Procedures: Your physician has requested that you have a cardiac catheterization. Cardiac catheterization is used to diagnose and/or treat various heart conditions. Doctors may recommend this procedure for a number of different reasons. The most common reason is to evaluate chest pain. Chest pain can be a symptom of coronary artery disease (CAD), and cardiac catheterization can show whether plaque is narrowing or blocking your heart's arteries. This procedure is also used to evaluate the valves, as well as measure the blood flow and oxygen levels in different parts of your heart. For further information please visit HugeFiesta[facility]tn. Please follow instruction sheet, as given.   Follow-Up: At [facility], you and your health needs are our priority.  As part of our continuing mission to provide you with exceptional heart care, we have created designated Provider Care Teams.  These Care Teams include your primary Cardiologist (physician) and Advanced Practice Providers (APPs -  Physician Assistants and Nurse Practitioners) who all work together to provide you with the care you need, when you need it.  Your next appointment:   3 month(s)  The format for your next appointment:   In Person  Provider:   You may see Mertie Moores, MD or one of the following Advanced Practice Providers on your  designated Care Team:    Melina Copa, PA-C  Ermalinda Barrios, PA-C    Other Instructions    [place] OFFICE [place], [place] [address] Dept: [phone] Loc: [phone]  Dailan Pfalzgraf  05/11/2020  You are scheduled for a Cardiac Catheterization on Wednesday, August 11 with Dr. Larae Grooms.  1. Please arrive at the [facility] (Main Entrance A) at [facility]: [address] [place], [place] [address] at 6:30 AM (This time is two hours before your procedure to ensure your preparation). Free valet parking service is available.   Special note: Every effort is made to have your procedure done on time. Please understand that emergencies sometimes delay scheduled procedures.  2. Diet: Do not eat solid foods after midnight.  The patient may have clear liquids until 5am upon the day of the procedure.  3. Labs: You will need to have blood drawn on Tuesday, August 3 at [facility] at [facility] [address]  Open: 7:30am - 5pm    Phone: [phone]. You do not need to be fasting.  4. COVID test on Monday, August 9th, 2021 at 2:15 pm at [address] [place],  [address]  4. Medication instructions in preparation for your procedure:   Contrast Allergy: No  On the morning of your procedure, take your Aspirin and any morning medicines NOT listed above.  You may use sips of water.  5. Plan for one night stay--bring personal belongings. 6. Bring a  current list of your medications and current insurance cards. 7. You MUST have a responsible person to drive you home. 8. Someone MUST be with you the first 24 hours after you arrive home or your discharge will be delayed. 9. Please wear clothes that are easy to get on and off and wear slip-on shoes.  Thank you for allowing [place] to care for you!   -- [place] Invasive Cardiovascular  services

## 2020-05-11 NOTE — Progress Notes (Signed)
Cardiology Office Note:    Date:  05/11/2020   ID:  Steven Beard, DOB 10-Dec-1945, MRN [id number]  PCP:  Patient, No Pcp Per  Cardiologist:  Crystalynn Mcinerney  Electrophysiologist:  None   Referring MD: No ref. provider found   Chief Complaint  Patient presents with  . Congestive Heart Failure    History of Present Illness:    Steven Beard is a 74 y.o. male with a hx of nonischemic cardiomyopathy.  His ejection fraction is around 35% by echocardiogram in 2019.  He recently moved from [place] and is transferring cardiology care. We were asked to see him today for follow up of his CHF by Dr. Benetta Spar.   Also has borderline DM.   HbA1C is around 7 .   Able to do his normal activities.  He is not very active Does not exercise much at all .   Lets are very weak .  Has difficulty getting out of a chair without using his arms.   Has trouble when he stands for a while - gets weak, nauseated.   November 18, 2019:  Steven Beard is seen today for follow-up of his congestive heart failure.  He is quite weak.  His blood pressure is very low today. We stopped his Lisinopril and started Entresto at his last visit.  Takes his BP on occasion Typical 110-120 / 60-70  January 28, 2020:  Steven Beard is seen today for follow-up of his congestive heart failure.  He was seen several months ago for a follow-up after initiating Entresto.  His blood pressure was too low to uptitrate any of his medications at that time. Echo is unchanged from previous EF Is waking more , legs are getting stronger   We discussed CHF clinic We discussed possible ICD placement   May 11, 2020: Steven Beard is seen today for follow-up of his congestive heart failure.  He has been on Entresto but despite that his ejection fraction has not changed.  We referred him to the advanced heart failure clinic.  His appointment is scheduled for September 30.  He is still not feeling well  - weakness and fatigue.   No CP ,  Chronic DOE especially waking up  hills myoview in May 2021 shows no ischemia or infarction   Low risk  lprimary MD is [name] in [place].   Past Medical History:  Diagnosis Date  . Hidradenitis   . History of BPH    benign prostatic hypertrophy  . History of hydrocele    in right testicle   . Hypothyroidism   . NICM (nonischemic cardiomyopathy) ([place])   . S/P gastric bypass     Past Surgical History:  Procedure Laterality Date  . CARDIOVASCULAR STRESS TEST     CARDIOLYTE ST LATE 2000's AT [facility] OFFICE REPORTEDLY NEGATIVE FOR ISCHEMIA  . ECHO DOPPLER COMPLETE(TRANSTHOR) ([facility] HX)     11/2010 HT ECHO W/COLORFLOW/SPECTRAL ([facility] HISTORICAL RESULT) LVEF 35%, GLOBAL HK, MILD MR/TR/AR, 2013 STABLE LVEF 35% GLOBAL HK, NO CHANGE C/W 2012, LVEF 40% WITH GLOBAL HK; TRIVIAL MR/AR, NO EFFUSION  . GASTRIC BYPASS     hx  . HYDROCELE EXCISION  2012   REPAIR    Current Medications: Current Meds  Medication Sig  . ALPRAZolam (XANAX) 1 MG tablet Take 0.5 mg by mouth at bedtime as needed for anxiety.   [name] buPROPion (WELLBUTRIN SR) 150 MG 12 hr tablet Take 150 mg by mouth 2 (two) times daily.   . carvedilol (COREG) 3.125 MG tablet Take 1  tablet (3.125 mg total) by mouth 2 (two) times daily with a meal.  . finasteride (PROSCAR) 5 MG tablet Take 5 mg by mouth daily.  [name] levothyroxine (SYNTHROID, LEVOTHROID) 175 MCG tablet Take 175 mcg by mouth daily before breakfast.  . Multiple Vitamins-Minerals (PRESERVISION AREDS) TABS Take 1 tablet by mouth 2 (two) times daily.   . sacubitril-valsartan (ENTRESTO) 24-26 MG Take 1 tablet by mouth 2 (two) times daily.  [name] spironolactone (ALDACTONE) 25 MG tablet Take 0.5 tablets (12.5 mg total) by mouth daily.  . tamsulosin (FLOMAX) 0.4 MG CAPS capsule Take 0.4 mg by mouth daily.  . traZODone (DESYREL) 50 MG tablet Take 50 mg by mouth at bedtime.  . [DISCONTINUED] buPROPion (ZYBAN) 150 MG 12 hr tablet Take 150 mg by mouth 2 (two) times daily.  . [DISCONTINUED] Multiple Vitamin (MULTIVITAMIN)  tablet Take 1 tablet by mouth daily.     Allergies:   Minocycline and Codeine   Social History   Socioeconomic History  . Marital status: Married    Spouse name: Not on file  . Number of children: Not on file  . Years of education: Not on file  . Highest education level: Not on file  Occupational History  . Not on file  Tobacco Use  . Smoking status: Never Smoker  . Smokeless tobacco: Never Used  Vaping Use  . Vaping Use: Never used  Substance and Sexual Activity  . Alcohol use: Yes  . Drug use: Never  . Sexual activity: Not on file  Other Topics Concern  . Not on file  Social History Narrative  . Not on file   Social Determinants of Health   Financial Resource Strain:   . Difficulty of Paying Living Expenses:   Food Insecurity:   . Worried About Charity fundraiser in the Last Year:   . Arboriculturist in the Last Year:   Transportation Needs:   . Film/video editor (Medical):   [name] Lack of Transportation (Non-Medical):   Physical Activity:   . Days of Exercise per Week:   . Minutes of Exercise per Session:   Stress:   . Feeling of Stress :   Social Connections:   . Frequency of Communication with Friends and Family:   . Frequency of Social Gatherings with Friends and Family:   . Attends Religious Services:   . Active Member of Clubs or Organizations:   . Attends Archivist Meetings:   [name] Marital Status:      Family History: The patient's family history includes Heart disease in his father and mother.  ROS:   Please see the history of present illness.     All other systems reviewed and are negative.  EKGs/Labs/Other Studies Reviewed:    The following studies were reviewed today:     Recent Labs: 05/11/2020: BUN 27; Creatinine, Ser 1.16; Hemoglobin WILL FOLLOW; Platelets WILL FOLLOW; Potassium 5.2; Sodium 140  Recent Lipid Panel No results found for: CHOL, TRIG, HDL, CHOLHDL, VLDL, LDLCALC, LDLDIRECT    Physical Exam: Blood pressure  (!) 95/56, pulse 78, height 5\' 11"  (1.803 m), weight 243 lb 12.8 oz (110.6 kg), SpO2 97 %.  GEN:   Elderly male,  modertely obese HEENT: Normal NECK: No JVD; No carotid bruits LYMPHATICS: No lymphadenopathy CARDIAC: RRR , no murmurs, rubs, gallops RESPIRATORY:  Clear to auscultation without rales, wheezing or rhonchi  ABDOMEN: Soft, non-tender, non-distended MUSCULOSKELETAL:  No edema; No deformity  SKIN: Warm and dry NEUROLOGIC:  Alert  and oriented x 3   EKG:   ASSESSMENT:    1. Chronic combined systolic and diastolic heart failure (HCC)    PLAN:    In order of problems listed above:  1.  Chronic systolic congestive heart failure:      His ejection fraction has not improved on Entresto and Aldactone.  I would like to add digoxin 0.25 mg a day.  He had a negative Myoview study but I would like to do a right left heart catheterization since it does not make any sense that his EF did not improve on maximal medical therapy.  His blood pressure is low and we cannot increase his medications at this time.  I discussed the risks, benefits, options of heart catheterization.  He understands and agrees to proceed.  He does have a referral to the CHF clinic in September.  2.  Generalized deconditioning: We will refer him to the PREP program at the [facility].  He may need to have a formal physical therapy consult but I will defer to his primary medical doctor.     Medication Adjustments/Labs and Tests Ordered: Current medicines are reviewed at length with the patient today.  Concerns regarding medicines are outlined above.  Orders Placed This Encounter  Procedures  . Basic metabolic panel  . CBC   Meds ordered this encounter  Medications  . digoxin (LANOXIN) 0.125 MG tablet    Sig: Take 1 tablet (0.125 mg total) by mouth daily.    Dispense:  90 tablet    Refill:  3     Patient Instructions  Medication Instructions:  Your physician has recommended you make the following change in  your medication:  1) START taking digoxin 0.125 mg daily   *If you need a refill on your cardiac medications before your next appointment, please call your pharmacy*  Lab Work: TODAY: BMET and CBC  If you have labs (blood work) drawn today and your tests are completely normal, you will receive your results only by: [name] MyChart Message (if you have MyChart) OR . A paper copy in the mail If you have any lab test that is abnormal or we need to change your treatment, we will call you to review the results.   Testing/Procedures: Your physician has requested that you have a cardiac catheterization. Cardiac catheterization is used to diagnose and/or treat various heart conditions. Doctors may recommend this procedure for a number of different reasons. The most common reason is to evaluate chest pain. Chest pain can be a symptom of coronary artery disease (CAD), and cardiac catheterization can show whether plaque is narrowing or blocking your heart's arteries. This procedure is also used to evaluate the valves, as well as measure the blood flow and oxygen levels in different parts of your heart. For further information please visit HugeFiesta.tn. Please follow instruction sheet, as given.   Follow-Up: At [facility], you and your health needs are our priority.  As part of our continuing mission to provide you with exceptional heart care, we have created designated Provider Care Teams.  These Care Teams include your primary Cardiologist (physician) and Advanced Practice Providers (APPs -  Physician Assistants and Nurse Practitioners) who all work together to provide you with the care you need, when you need it.  Your next appointment:   3 month(s)  The format for your next appointment:   In Person  Provider:   You may see Mertie Moores, MD or one of the following Advanced Practice Providers on  your designated Care Team:    Melina Copa, PA-C  Ermalinda Barrios, PA-C    Other  Instructions    [facility] OFFICE [place], [place] [address] Dept: [phone] Loc: [phone]  Gordie Crumby  05/11/2020  You are scheduled for a Cardiac Catheterization on Wednesday, August 11 with Dr. Larae Grooms.  1. Please arrive at the [facility] (Main Entrance A) at [facility]: [address] [place], [place] [address] at 6:30 AM (This time is two hours before your procedure to ensure your preparation). Free valet parking service is available.   Special note: Every effort is made to have your procedure done on time. Please understand that emergencies sometimes delay scheduled procedures.  2. Diet: Do not eat solid foods after midnight.  The patient may have clear liquids until 5am upon the day of the procedure.  3. Labs: You will need to have blood drawn on Tuesday, August 3 at [facility] at [facility]. [address]  Open: 7:30am - 5pm    Phone: [phone]. You do not need to be fasting.  4. COVID test on Monday, August 9th, 2021 at 2:15 pm at [address] [place], [place] [address]  4. Medication instructions in preparation for your procedure:   Contrast Allergy: No  On the morning of your procedure, take your Aspirin and any morning medicines NOT listed above.  You may use sips of water.  5. Plan for one night stay--bring personal belongings. 6. Bring a current list of your medications and current insurance cards. 7. You MUST have a responsible person to drive you home. 8. Someone MUST be with you the first 24 hours after you arrive home or your discharge will be delayed. 9. Please wear clothes that are easy to get on and off and wear slip-on shoes.  Thank you for allowing [place] to care for you!   -- [facility] Invasive Cardiovascular services      Signed, Mertie Moores, MD  05/11/2020 5:31 PM    [place]

## 2020-05-17 ENCOUNTER — Other Ambulatory Visit (HOSPITAL_COMMUNITY)
Admission: RE | Admit: 2020-05-17 | Discharge: 2020-05-17 | Disposition: A | Discharge status: Home / Self Care | Payer: Medicare Other | Source: Ambulatory Visit | Attending: Interventional Cardiology | Admitting: Interventional Cardiology

## 2020-05-17 ENCOUNTER — Telehealth: Payer: Self-pay | Admitting: *Deleted

## 2020-05-17 DIAGNOSIS — Z01812 Encounter for preprocedural laboratory examination: Secondary | ICD-10-CM | POA: Insufficient documentation

## 2020-05-17 DIAGNOSIS — Z20822 Contact with and (suspected) exposure to covid-19: Secondary | ICD-10-CM | POA: Insufficient documentation

## 2020-05-17 LAB — SARS CORONAVIRUS 2 (TAT 6-24 HRS): SARS Coronavirus 2: NEGATIVE

## 2020-05-17 NOTE — Telephone Encounter (Signed)
Pt contacted pre-catheterization scheduled at [facility] for: Wednesday May 19, 2020 8:30 AM Verified arrival time and place: [facility]) at: 6:30 AM   No solid food after midnight prior to cath, clear liquids until 5 AM day of procedure.  Hold: Spironolactone-AM of procedure  Except hold medications AM meds can be  taken pre-cath with sips of water including: ASA 81 mg   Confirmed patient has responsible adult to drive home post procedure and observe 24 hours after arriving home: yes  You are allowed ONE visitor in the waiting room during your procedure. Both you and your visitor must wear a mask once you enter the hospital.      COVID-19 Pre-Screening Questions:  . In the past 14 days have you had a new cough associated with shortness of breath, unexplained body aches, fever (100.4 or greater) or sudden loss of taste or sense of smell? no . In the past 14 days have you been around anyone with known Covid 19? no . Have you been vaccinated for COVID-19? Yes, see immunization history   Reviewed procedure/mask/visitor instructions, COVID-19 questions with patient.

## 2020-05-19 ENCOUNTER — Ambulatory Visit (HOSPITAL_COMMUNITY)
Admission: RE | Admit: 2020-05-19 | Discharge: 2020-05-19 | Disposition: A | Discharge status: Home / Self Care | Payer: Medicare Other | Attending: Interventional Cardiology | Admitting: Interventional Cardiology

## 2020-05-19 ENCOUNTER — Encounter (HOSPITAL_COMMUNITY)
Admission: RE | Disposition: A | Discharge status: Home / Self Care | Payer: Self-pay | Source: Home / Self Care | Attending: Interventional Cardiology

## 2020-05-19 ENCOUNTER — Encounter (HOSPITAL_COMMUNITY): Payer: Medicare Other | Admitting: Internal Medicine

## 2020-05-19 DIAGNOSIS — R079 Chest pain, unspecified: Secondary | ICD-10-CM | POA: Insufficient documentation

## 2020-05-19 DIAGNOSIS — E039 Hypothyroidism, unspecified: Secondary | ICD-10-CM | POA: Diagnosis not present

## 2020-05-19 DIAGNOSIS — I2510 Atherosclerotic heart disease of native coronary artery without angina pectoris: Secondary | ICD-10-CM | POA: Diagnosis not present

## 2020-05-19 DIAGNOSIS — Z79890 Hormone replacement therapy (postmenopausal): Secondary | ICD-10-CM | POA: Diagnosis not present

## 2020-05-19 DIAGNOSIS — I5042 Chronic combined systolic (congestive) and diastolic (congestive) heart failure: Secondary | ICD-10-CM | POA: Diagnosis present

## 2020-05-19 DIAGNOSIS — L732 Hidradenitis suppurativa: Secondary | ICD-10-CM | POA: Diagnosis not present

## 2020-05-19 DIAGNOSIS — I428 Other cardiomyopathies: Secondary | ICD-10-CM | POA: Insufficient documentation

## 2020-05-19 DIAGNOSIS — R7303 Prediabetes: Secondary | ICD-10-CM | POA: Diagnosis not present

## 2020-05-19 DIAGNOSIS — Z79899 Other long term (current) drug therapy: Secondary | ICD-10-CM | POA: Insufficient documentation

## 2020-05-19 DIAGNOSIS — Z9884 Bariatric surgery status: Secondary | ICD-10-CM | POA: Insufficient documentation

## 2020-05-19 HISTORY — PX: CARDIAC CATHETERIZATION: SHX172

## 2020-05-19 HISTORY — PX: RIGHT/LEFT HEART CATH AND CORONARY ANGIOGRAPHY: CATH118266

## 2020-05-19 LAB — POCT I-STAT 7, (LYTES, BLD GAS, ICA,H+H)
Acid-Base Excess: 0 mmol/L (ref 0.0–2.0)
Bicarbonate: 25.3 mmol/L (ref 20.0–28.0)
Calcium, Ion: 1.12 mmol/L — ABNORMAL LOW (ref 1.15–1.40)
HCT: 33 % — ABNORMAL LOW (ref 39.0–52.0)
Hemoglobin: 11.2 g/dL — ABNORMAL LOW (ref 13.0–17.0)
O2 Saturation: 90 %
Potassium: 4.3 mmol/L (ref 3.5–5.1)
Sodium: 143 mmol/L (ref 135–145)
TCO2: 27 mmol/L (ref 22–32)
pCO2 arterial: 42.6 mmHg (ref 32.0–48.0)
pH, Arterial: 7.383 (ref 7.350–7.450)
pO2, Arterial: 60 mmHg — ABNORMAL LOW (ref 83.0–108.0)

## 2020-05-19 LAB — POCT I-STAT EG7
Acid-Base Excess: 1 mmol/L (ref 0.0–2.0)
Bicarbonate: 26.9 mmol/L (ref 20.0–28.0)
Calcium, Ion: 1.17 mmol/L (ref 1.15–1.40)
HCT: 34 % — ABNORMAL LOW (ref 39.0–52.0)
Hemoglobin: 11.6 g/dL — ABNORMAL LOW (ref 13.0–17.0)
O2 Saturation: 66 %
Potassium: 4.5 mmol/L (ref 3.5–5.1)
Sodium: 142 mmol/L (ref 135–145)
TCO2: 28 mmol/L (ref 22–32)
pCO2, Ven: 47.5 mmHg (ref 44.0–60.0)
pH, Ven: 7.36 (ref 7.250–7.430)
pO2, Ven: 36 mmHg (ref 32.0–45.0)

## 2020-05-19 LAB — GLUCOSE, CAPILLARY
Glucose-Capillary: 125 mg/dL — ABNORMAL HIGH (ref 70–99)
Glucose-Capillary: 120 mg/dL — ABNORMAL HIGH (ref 70–99)

## 2020-05-19 SURGERY — RIGHT/LEFT HEART CATH AND CORONARY ANGIOGRAPHY
Anesthesia: LOCAL

## 2020-05-19 MED ORDER — HEPARIN SODIUM (PORCINE) 1000 UNIT/ML IJ SOLN
INTRAMUSCULAR | Status: AC
  Filled 2020-05-19: qty 1

## 2020-05-19 MED ORDER — SODIUM CHLORIDE 0.9 % WEIGHT BASED INFUSION: 1.0000 mL/kg/h | INTRAVENOUS | Status: DC

## 2020-05-19 MED ORDER — MIDAZOLAM HCL 2 MG/2ML IJ SOLN
INTRAMUSCULAR | Status: DC | PRN
  Administered 2020-05-19 (×3): 1 mg via INTRAVENOUS

## 2020-05-19 MED ORDER — ONDANSETRON HCL 4 MG/2ML IJ SOLN: 4.0000 mg | Freq: Four times a day (QID) | INTRAMUSCULAR | Status: DC | PRN

## 2020-05-19 MED ORDER — ASPIRIN 81 MG PO CHEW: 81.0000 mg | CHEWABLE_TABLET | ORAL | Status: DC

## 2020-05-19 MED ORDER — HEPARIN (PORCINE) IN NACL 1000-0.9 UT/500ML-% IV SOLN
INTRAVENOUS | Status: DC | PRN
  Administered 2020-05-19 (×2): 500 mL

## 2020-05-19 MED ORDER — IOHEXOL 350 MG/ML SOLN
INTRAVENOUS | Status: DC | PRN
  Administered 2020-05-19: 35 mL

## 2020-05-19 MED ORDER — VERAPAMIL HCL 2.5 MG/ML IV SOLN
INTRAVENOUS | Status: DC | PRN
  Administered 2020-05-19: 10 mL via INTRA_ARTERIAL

## 2020-05-19 MED ORDER — VERAPAMIL HCL 2.5 MG/ML IV SOLN
INTRAVENOUS | Status: AC
  Filled 2020-05-19: qty 2

## 2020-05-19 MED ORDER — SODIUM CHLORIDE 0.9 % IV SOLN: 250.0000 mL | INTRAVENOUS | Status: DC | PRN

## 2020-05-19 MED ORDER — MIDAZOLAM HCL 2 MG/2ML IJ SOLN
INTRAMUSCULAR | Status: AC
  Filled 2020-05-19: qty 2

## 2020-05-19 MED ORDER — FENTANYL CITRATE (PF) 100 MCG/2ML IJ SOLN
INTRAMUSCULAR | Status: AC
  Filled 2020-05-19: qty 2

## 2020-05-19 MED ORDER — LIDOCAINE HCL (PF) 1 % IJ SOLN
INTRAMUSCULAR | Status: DC | PRN
  Administered 2020-05-19 (×2): 2 mL

## 2020-05-19 MED ORDER — LIDOCAINE HCL (PF) 1 % IJ SOLN
INTRAMUSCULAR | Status: AC
  Filled 2020-05-19: qty 30

## 2020-05-19 MED ORDER — HYDRALAZINE HCL 20 MG/ML IJ SOLN: 10.0000 mg | INTRAMUSCULAR | Status: DC | PRN

## 2020-05-19 MED ORDER — HEPARIN SODIUM (PORCINE) 1000 UNIT/ML IJ SOLN
INTRAMUSCULAR | Status: DC | PRN
  Administered 2020-05-19: 5000 [IU] via INTRAVENOUS

## 2020-05-19 MED ORDER — HEPARIN (PORCINE) IN NACL 1000-0.9 UT/500ML-% IV SOLN
INTRAVENOUS | Status: AC
  Filled 2020-05-19: qty 1000

## 2020-05-19 MED ORDER — SODIUM CHLORIDE 0.9 % WEIGHT BASED INFUSION
3.0000 mL/kg/h | INTRAVENOUS | Status: AC
  Administered 2020-05-19: 3 mL/kg/h via INTRAVENOUS

## 2020-05-19 MED ORDER — SODIUM CHLORIDE 0.9% FLUSH: 3.0000 mL | INTRAVENOUS | Status: DC | PRN

## 2020-05-19 MED ORDER — SODIUM CHLORIDE 0.9% FLUSH: 3.0000 mL | Freq: Two times a day (BID) | INTRAVENOUS | Status: DC

## 2020-05-19 MED ORDER — FENTANYL CITRATE (PF) 100 MCG/2ML IJ SOLN
INTRAMUSCULAR | Status: DC | PRN
  Administered 2020-05-19 (×3): 25 ug via INTRAVENOUS

## 2020-05-19 MED ORDER — LABETALOL HCL 5 MG/ML IV SOLN: 10.0000 mg | INTRAVENOUS | Status: DC | PRN

## 2020-05-19 MED ORDER — ACETAMINOPHEN 325 MG PO TABS: 650.0000 mg | ORAL_TABLET | ORAL | Status: DC | PRN

## 2020-05-19 SURGICAL SUPPLY — 13 items
CATH 5FR JL3.5 JR4 ANG PIG MP (CATHETERS) ×2 IMPLANT
CATH BALLN WEDGE 5F 110CM (CATHETERS) ×2 IMPLANT
DEVICE RAD COMP TR BAND LRG (VASCULAR PRODUCTS) ×2 IMPLANT
GLIDESHEATH SLEND SS 6F .021 (SHEATH) ×2 IMPLANT
GUIDEWIRE .025 260CM (WIRE) ×2 IMPLANT
GUIDEWIRE INQWIRE 1.5J.035X260 (WIRE) ×1 IMPLANT
INQWIRE 1.5J .035X260CM (WIRE) ×2
KIT HEART LEFT (KITS) ×2 IMPLANT
PACK CARDIAC CATHETERIZATION (CUSTOM PROCEDURE TRAY) ×2 IMPLANT
SHEATH GLIDE SLENDER 4/5FR (SHEATH) ×2 IMPLANT
SYR MEDRAD MARK 7 150ML (SYRINGE) ×2 IMPLANT
TRANSDUCER W/STOPCOCK (MISCELLANEOUS) ×2 IMPLANT
TUBING CIL FLEX 10 FLL-RA (TUBING) ×2 IMPLANT

## 2020-05-19 NOTE — Interval H&P Note (Signed)
Cath Lab Visit (complete for each Cath Lab visit)  Clinical Evaluation Leading to the Procedure:   ACS: No.  Non-ACS:    Anginal Classification: CCS III  Anti-ischemic medical therapy: Maximal Therapy (2 or more classes of medications)  Non-Invasive Test Results: High-risk stress test findings: cardiac mortality >3%/year  Prior CABG: No previous CABG  Low EF by echo    History and Physical Interval Note:  05/19/2020 8:35 AM  Steven Beard  has presented today for surgery, with the diagnosis of chest pain.  The various methods of treatment have been discussed with the patient and family. After consideration of risks, benefits and other options for treatment, the patient has consented to  Procedure(s): RIGHT/LEFT HEART CATH AND CORONARY ANGIOGRAPHY (N/A) as a surgical intervention.  The patient's history has been reviewed, patient examined, no change in status, stable for surgery.  I have reviewed the patient's chart and labs.  Questions were answered to the patient's satisfaction.     Larae Grooms

## 2020-05-19 NOTE — Discharge Instructions (Signed)
Drink plenty of fluids for 48 hours and keep wrist elevated at heart level for 24 hours  Radial Site Care   This sheet gives you information about how to care for yourself after your procedure. Your health care provider may also give you more specific instructions. If you have problems or questions, contact your health care provider. What can I expect after the procedure? After the procedure, it is common to have:  Bruising and tenderness at the catheter insertion area. Follow these instructions at home: Medicines  Take over-the-counter and prescription medicines only as told by your health care provider. Insertion site care 1. Follow instructions from your health care provider about how to take care of your insertion site. Make sure you: ? Wash your hands with soap and water before you change your bandage (dressing). If soap and water are not available, use hand sanitizer. ? Remove your dressing as told by your health care provider. In 24 hours 2. Check your insertion site every day for signs of infection. Check for: ? Redness, swelling, or pain. ? Fluid or blood. ? Pus or a bad smell. ? Warmth. 3. Do not take baths, swim, or use a hot tub until your health care provider approves. 4. You may shower 24-48 hours after the procedure, or as directed by your health care provider. ? Remove the dressing and gently wash the site with plain soap and water. ? Pat the area dry with a clean towel. ? Do not rub the site. That could cause bleeding. 5. Do not apply powder or lotion to the site. Activity   1. For 24 hours after the procedure, or as directed by your health care provider: ? Do not flex or bend the affected arm. ? Do not push or pull heavy objects with the affected arm. ? Do not drive yourself home from the hospital or clinic. You may drive 24 hours after the procedure unless your health care provider tells you not to. ? Do not operate machinery or power tools. 2. Do not lift  anything that is heavier than 10 lb (4.5 kg), or the limit that you are told, until your health care provider says that it is safe.  For 4 days 3. Ask your health care provider when it is okay to: ? Return to work or school. ? Resume usual physical activities or sports. ? Resume sexual activity. General instructions  If the catheter site starts to bleed, raise your arm and put firm pressure on the site. If the bleeding does not stop, get help right away. This is a medical emergency.  If you went home on the same day as your procedure, a responsible adult should be with you for the first 24 hours after you arrive home.  Keep all follow-up visits as told by your health care provider. This is important. Contact a health care provider if:  You have a fever.  You have redness, swelling, or yellow drainage around your insertion site. Get help right away if:  You have unusual pain at the radial site.  The catheter insertion area swells very fast.  The insertion area is bleeding, and the bleeding does not stop when you hold steady pressure on the area.  Your arm or hand becomes pale, cool, tingly, or numb. These symptoms may represent a serious problem that is an emergency. Do not wait to see if the symptoms will go away. Get medical help right away. Call your local emergency services (911 in the [place] Do   not drive yourself to the hospital. Summary  After the procedure, it is common to have bruising and tenderness at the site.  Follow instructions from your health care provider about how to take care of your radial site wound. Check the wound every day for signs of infection.  Do not lift anything that is heavier than 10 lb (4.5 kg), or the limit that you are told, until your health care provider says that it is safe. This information is not intended to replace advice given to you by your health care provider. Make sure you discuss any questions you have with your health care  provider. Document Revised: 10/31/2017 Document Reviewed: 10/31/2017 Elsevier Patient Education  2020 Elsevier Inc.  

## 2020-05-19 NOTE — Research (Signed)
[facility] Informed Consent   Subject Name: Steven Beard  Subject met inclusion and exclusion criteria.  The informed consent form, study requirements and expectations were reviewed with the subject and questions and concerns were addressed prior to the signing of the consent form.  The subject verbalized understanding of the trial requirements.  The subject agreed to participate in the [facility] trial and signed the informed consent at [facility] on 05/19/2020.  The informed consent was obtained prior to performance of any protocol-specific procedures for the subject.  A copy of the signed informed consent was given to the subject and a copy was placed in the subject's medical record.   [name]

## 2020-05-20 ENCOUNTER — Encounter (HOSPITAL_COMMUNITY): Payer: Self-pay | Admitting: Interventional Cardiology

## 2020-06-07 ENCOUNTER — Ambulatory Visit: Payer: Medicare Other | Admitting: Cardiovascular Disease

## 2020-06-09 ENCOUNTER — Other Ambulatory Visit: Payer: Self-pay | Admitting: Surgery

## 2020-06-18 ENCOUNTER — Telehealth: Payer: Self-pay | Admitting: Cardiovascular Disease

## 2020-06-18 NOTE — Telephone Encounter (Signed)
Spoke with pt and pt's wife.  Pt states Dr Acie Fredrickson advised him to hold Furosemide, pt reports he has never been prescribed Furosemide.  Spoke with Dr Acie Fredrickson who recommends pt hold Entresto x 2 days and restart Monday.  Stop Spironolactone and will restart at a later date.  Pt has appointment scheduled for 06/29/2020 at 820am.  Pt advised if he develops additional symptoms to contact or office.  Reviewed ED protocol.  Pt verbalizes understanding and agrees with current plan.

## 2020-06-18 NOTE — Telephone Encounter (Signed)
Note concerning [name] sent by his wife , Inez Catalina in her Mychart account    Last read by Darcella Cheshire at 1:07 PM on 06/18/2020. Thora Lance, RN to Me      06/18/20 10:58 AM Please advise  Lazaro, [place] to Me   BW   06/18/20 9:03 AM Unfortunately we could not get an appointment for [facility] into the Advanced Congestive Heart Failure Clinic earlier. His appointment is not until 9/30. I am very concerned about his condition. Some of it could be depression which we will address with his medical doctor next week. But he is overall getting weaker it seems. He sleeps through the day, barely eats, no appetite, and gets very light headed when standing at times which then makes him nauseated and extremely weak. It is not every time he stands but often. I am trying to get him to take a supplement like ensure. Sitting BP 87/57 P74 Pulse Ox93 this am. I was hoping to maybe cut back on one med to help his BP to come up a little? I am sure his blood pressure is bottoming out when he stands. Or would you prefer he remain on all meds until he goes to the clinic. A trip to ER is out of the question with all the Covid and he will not go anyway. It is like he has just spiraled down quickly. It is like they say, we are between a rock and a hard place. Any suggestions?    HI Inez Catalina,  It looks like he is on furosemide 20 mg a day , spironolactone 25 mg a day , carvedilol 3.125 twice a day   Lets hold the furosemide and spironolactone for the next 2 days which should allow his BP to increase to a more normal level  Restart Spironolactone 25 mg a day on Monday  Do not restart the furosemide> ( but do not throw the bottle away yet incase we need to add it later )  This should help him feel better, I have an opening on Tuesday, Sept if you want to have him come in .   I'll have a triage nurse call and discuss with you.  Hope he feels better soon.  Liam Rogers , MD

## 2020-06-22 ENCOUNTER — Telehealth: Payer: Self-pay | Admitting: Nurse Practitioner

## 2020-06-22 ENCOUNTER — Ambulatory Visit: Payer: Medicare Other | Admitting: Cardiovascular Disease

## 2020-06-22 NOTE — Telephone Encounter (Signed)
Received MyChart messages regarding this patient from his wife attached to her account.   I was not able to access [name]'s my chart so this is my chart [name] .I am writing about Steven Beard 2045-11-03. Here is the issue. We should have come to your office today instead of his medical doctor in [place] but he does have an appointment next Tuesday with you. His medical did lots of blood work today and started him on a new antidepressant. But he is down to 231lbs and still not eating. This morning he could not stand long enough to shave. Then I noticed when he became extremely weak his pulse was 110 after sitting down. And then just recently after he got of he car and into the house., he had to lie down pulse 120 after lying down. So standing BP dropping? Yesterday he restarted back on Entresto. And he is still off Aldactone. He can not stand for long, he can walk short distances, extremely fatigued. Not sure what we can do for him. I think he did better off Entresto over the weekend. Sorry to not write on the correct chart but I thought better to get this message to you now. He is in bed asleep. My phone number is [phone].  Thanks, Yeshaya Vath   Advised patient's wife to hold aldactone and Entresto until patient's BP improves. Advised that patient may hold these medications until his appointment next week with Dr. Acie Fredrickson and that patient needs to increase hydration and caloric intake.

## 2020-06-29 ENCOUNTER — Encounter: Payer: Self-pay | Admitting: Cardiovascular Disease

## 2020-06-29 ENCOUNTER — Other Ambulatory Visit: Payer: Self-pay | Admitting: Cardiovascular Disease

## 2020-06-29 ENCOUNTER — Ambulatory Visit (INDEPENDENT_AMBULATORY_CARE_PROVIDER_SITE_OTHER): Payer: Medicare Other | Admitting: Cardiovascular Disease

## 2020-06-29 ENCOUNTER — Other Ambulatory Visit: Payer: Self-pay

## 2020-06-29 VITALS — BP 88/62 | HR 105 | Ht 71.0 in | Wt 235.0 lb

## 2020-06-29 DIAGNOSIS — R634 Abnormal weight loss: Secondary | ICD-10-CM

## 2020-06-29 DIAGNOSIS — I5022 Chronic systolic (congestive) heart failure: Secondary | ICD-10-CM | POA: Diagnosis not present

## 2020-06-29 DIAGNOSIS — I952 Hypotension due to drugs: Secondary | ICD-10-CM

## 2020-06-29 DIAGNOSIS — I428 Other cardiomyopathies: Secondary | ICD-10-CM | POA: Diagnosis not present

## 2020-06-29 LAB — BASIC METABOLIC PANEL
BUN/Creatinine Ratio: 23 (ref 10–24)
BUN: 30 mg/dL — ABNORMAL HIGH (ref 8–27)
CO2: 27 mmol/L (ref 20–29)
Calcium: 8.7 mg/dL (ref 8.6–10.2)
Chloride: 100 mmol/L (ref 96–106)
Creatinine, Ser: 1.3 mg/dL — ABNORMAL HIGH (ref 0.76–1.27)
GFR calc Af Amer: 62 mL/min/{1.73_m2} (ref >59)
GFR calc non Af Amer: 54 mL/min/{1.73_m2} — ABNORMAL LOW (ref >59)
Glucose: 169 mg/dL — ABNORMAL HIGH (ref 65–99)
Potassium: 5.3 mmol/L — ABNORMAL HIGH (ref 3.5–5.2)
Sodium: 140 mmol/L (ref 134–144)

## 2020-06-29 NOTE — Progress Notes (Signed)
Cardiology Office Note:    Date:  07/12/2020   ID:  Steven Beard, DOB 15-Oct-1945, MRN [id number]  PCP:  Pcp, No  Cardiologist:  Steven Beard  Electrophysiologist:  None   Referring MD: No ref. provider found   Chief Complaint  Patient presents with  . Congestive Heart Failure    History of Present Illness:    Steven Beard is a 74 y.o. male with a hx of nonischemic cardiomyopathy.  His ejection fraction is around 35% by echocardiogram in 2019.  He recently moved from [place] and is transferring cardiology care. We were asked to see him today for follow up of his CHF by Dr. Benetta Beard.   Also has borderline DM.   HbA1C is around 7 .   Able to do his normal activities.  He is not very active Does not exercise much at all .   Lets are very weak .  Has difficulty getting out of a chair without using his arms.   Has trouble when he stands for a while - gets weak, nauseated.   November 18, 2019:  Steven Beard is seen today for follow-up of his congestive heart failure.  He is quite weak.  His blood pressure is very low today. We stopped his Lisinopril and started Entresto at his last visit.  Takes his BP on occasion Typical 110-120 / 60-70  January 28, 2020:  Steven Beard is seen today for follow-up of his congestive heart failure.  He was seen several months ago for a follow-up after initiating Entresto.  His blood pressure was too low to uptitrate any of his medications at that time. Echo is unchanged from previous EF Is waking more , legs are getting stronger   We discussed CHF clinic We discussed possible ICD placement   May 11, 2020: Steven Beard is seen today for follow-up of his congestive heart failure.  He has been on Entresto but despite that his ejection fraction has not changed.  We referred him to the advanced heart failure clinic.  His appointment is scheduled for September 30.  He is still not feeling well  - weakness and fatigue.   No CP ,  Chronic DOE especially waking up [place] myoview in  May 2021 shows no ischemia or infarction   Low risk  lprimary MD is Steven Beard in [place].   Sept. 21, 2021: Seen with wife , Steven Beard is seen today for follow-up of his nonischemic cardiomyopathy.  He has an ejection fraction of 25 to 30%.  We have tried him on Entresto but this did not improve his LV function.  Recently, he has had profound hypotension and the Steven Beard has been on hold.  Spironolactone is also on hold   Cardiac cath Aug. 11, 2021 showed minimal, nonobstructive coronary artery disease.  His LVEF was 25 to 35%.  LV end-diastolic pressure was normal.  His PA pressure was 40/17 with a mean PA pressure of 26.  Pulmonary capillary wedge pressure was 15.  Cardiac output is 7.8 L/min with a cardiac index of 3.4 L/min.  His primary MD Steven Bushman, MD) ( phone [phone])   wants to do an abdominal CT .  We will need to order  CRP and ESR are elevated, no night sweats.  Has lost lots of weight ( 20 lbs in 7 months ) , is not eating , has no appetite.  Lies in bed and sleeps a lot .  Is trying an antidepressant    Past Medical History:  Diagnosis Date  . Hidradenitis   . History of BPH    benign prostatic hypertrophy  . History of hydrocele    in right testicle   . Hypothyroidism   . NICM (nonischemic cardiomyopathy) ([place])   . S/P gastric bypass     Past Surgical History:  Procedure Laterality Date  . CARDIOVASCULAR STRESS TEST     CARDIOLYTE ST LATE 2000's AT [facility] OFFICE REPORTEDLY NEGATIVE FOR ISCHEMIA  . ECHO DOPPLER COMPLETE(TRANSTHOR) ([facility] HX)     11/2010 HT ECHO W/COLORFLOW/SPECTRAL ([facility] HISTORICAL RESULT) LVEF 35%, GLOBAL HK, MILD MR/TR/AR, 2013 STABLE LVEF 35% GLOBAL HK, NO CHANGE C/W 2012, LVEF 40% WITH GLOBAL HK; TRIVIAL MR/AR, NO EFFUSION  . GASTRIC BYPASS     hx  . HYDROCELE EXCISION  2012   REPAIR  . RIGHT/LEFT HEART CATH AND CORONARY ANGIOGRAPHY N/A 05/19/2020   Procedure: RIGHT/LEFT HEART CATH AND CORONARY ANGIOGRAPHY;  Surgeon: Steven Booze, MD;  Location: [facility];  Service: Cardiovascular;  Laterality: N/A;    Current Medications: Current Meds  Medication Sig  . acetaminophen (TYLENOL) 500 MG tablet Take 1,000 mg by mouth every 6 (six) hours as needed for moderate pain or headache.  . ALPRAZolam (XANAX) 1 MG tablet Take 0.5 mg by mouth at bedtime as needed for anxiety.   [name] buPROPion (WELLBUTRIN SR) 150 MG 12 hr tablet Take 150 mg by mouth daily.   . finasteride (PROSCAR) 5 MG tablet Take 5 mg by mouth daily.  [name] levothyroxine (SYNTHROID, LEVOTHROID) 175 MCG tablet Take 175 mcg by mouth daily before breakfast. 6 days a week  . mirtazapine (REMERON) 7.5 MG tablet Take 7.5 mg by mouth at bedtime. (Patient not taking: Reported on 07/08/2020)  . Multiple Vitamins-Minerals (PRESERVISION AREDS) TABS Take 1 tablet by mouth 2 (two) times daily.   . naproxen sodium (ALEVE) 220 MG tablet Take 220 mg by mouth daily as needed (back pain).  . tamsulosin (FLOMAX) 0.4 MG CAPS capsule Take 0.4 mg by mouth daily.  . traZODone (DESYREL) 50 MG tablet Take 25 mg by mouth at bedtime.   [name] VITAMIN D PO Take 1 capsule by mouth daily.  . [DISCONTINUED] carvedilol (COREG) 3.125 MG tablet Take 1 tablet (3.125 mg total) by mouth 2 (two) times daily with a meal.  . [DISCONTINUED] digoxin (LANOXIN) 0.125 MG tablet Take 1 tablet (0.125 mg total) by mouth daily.  . [DISCONTINUED] sacubitril-valsartan (ENTRESTO) 24-26 MG Take 1 tablet by mouth 2 (two) times daily.     Allergies:   Minocycline and Codeine   Social History   Socioeconomic History  . Marital status: Married    Spouse name: Not on file  . Number of children: Not on file  . Years of education: Not on file  . Highest education level: Not on file  Occupational History  . Not on file  Tobacco Use  . Smoking status: Never Smoker  . Smokeless tobacco: Never Used  Vaping Use  . Vaping Use: Never used  Substance and Sexual Activity  . Alcohol use: Yes    Alcohol/week:  1.0 standard drink    Types: 1 Glasses of wine per week    Comment: occ   . Drug use: Never  . Sexual activity: Not on file  Other Topics Concern  . Not on file  Social History Narrative  . Not on file   Social Determinants of Health   Financial Resource Strain:   . Difficulty of Paying Living Expenses: Not on  file  Food Insecurity:   . Worried About Charity fundraiser in the Last Year: Not on file  . Ran Out of Food in the Last Year: Not on file  Transportation Needs:   . Lack of Transportation (Medical): Not on file  . Lack of Transportation (Non-Medical): Not on file  Physical Activity:   . Days of Exercise per Week: Not on file  . Minutes of Exercise per Session: Not on file  Stress:   . Feeling of Stress : Not on file  Social Connections:   . Frequency of Communication with Friends and Family: Not on file  . Frequency of Social Gatherings with Friends and Family: Not on file  . Attends Religious Services: Not on file  . Active Member of Clubs or Organizations: Not on file  . Attends Archivist Meetings: Not on file  . Marital Status: Not on file     Family History: The patient's family history includes Heart disease in his father and mother.  ROS:   Please see the history of present illness.     All other systems reviewed and are negative.  EKGs/Labs/Other Studies Reviewed:    The following studies were reviewed today:     Recent Labs: 07/04/2020: ALT 21; BUN 30; Creatinine, Ser 1.21; Hemoglobin 12.5; Platelets 394; Potassium 5.3; Sodium 141 07/08/2020: TSH 0.358  Recent Lipid Panel No results found for: CHOL, TRIG, HDL, CHOLHDL, VLDL, LDLCALC, LDLDIRECT    Physical Exam: Blood pressure (!) 88/62, pulse (!) 105, height 5' 11"  (1.803 m), weight 235 lb (106.6 kg), SpO2 97 %.  GEN:  Well nourished, well developed in no acute distress HEENT: Normal NECK: No JVD; No carotid bruits LYMPHATICS: No lymphadenopathy CARDIAC: RRR , no murmurs, rubs,  gallops RESPIRATORY:  Clear to auscultation without rales, wheezing or rhonchi  ABDOMEN: Soft, non-tender, non-distended MUSCULOSKELETAL:  No edema; No deformity  SKIN: Warm and dry NEUROLOGIC:  Alert and oriented x 3   EKG:   ASSESSMENT:    1. Weight loss, non-intentional    PLAN:       1.  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  His heart catheterization revealed minor, nonobstructive coronary lesions.  His ejection fraction was 25 to 35%.  We have scheduled him an appointment to see Dr. Pierre Bali in the heart failure clinic next week.    He did not tolerate Entresto or spironolactone.  We will continue carvedilol and digoxin for now.    2.  Weight loss/fatigue: He has been seen by his primary medical doctor.  His doctor wanted to order a CT of the abdomen and pelvis with contrast.  He was not able to order to be done in the [facility] system through epic.  Help facilitate that I will order a CT of the abdomen pelvis to be done with contrast.  I will forward the results to his primary medical doctor.  Steven Bushman, MD) ( phone [phone])  3.  Elevated ESR and CRP:   20 lb weight loss over past several months   4. Anemia :   Medication Adjustments/Labs and Tests Ordered: Current medicines are reviewed at length with the patient today.  Concerns regarding medicines are outlined above.  Orders Placed This Encounter  Procedures  . CT ABDOMEN PELVIS W CONTRAST  . Basic metabolic panel   No orders of the defined types were placed in this encounter.  Patient Instructions  Medication Instructions:  1) STOP ENTRESTO 2) STOP SPIRONOLACTONE *If you need a refill on your cardiac medications before your next appointment, please call your pharmacy*  Lab Work: TODAY! BMET If you have labs (blood work) drawn today and your tests are completely normal, you will receive your results only by: [name] MyChart Message  (if you have MyChart) OR . A paper copy in the mail If you have any lab test that is abnormal or we need to change your treatment, we will call you to review the results.  Testing/Procedures: A CT of your abdomen and pelvis has been ordered per Dr. Ruby Cola.  Follow-Up: Your provider recommends that you schedule a follow-up appointment AS NEEDED with Dr. Acie Fredrickson.   Make sure to keep your appointment with Dr. Haroldine Laws.     Signed, Mertie Moores, MD  07/12/2020 5:42 PM    [facility]

## 2020-06-29 NOTE — Patient Instructions (Addendum)
Medication Instructions:  1) STOP ENTRESTO 2) STOP SPIRONOLACTONE *If you need a refill on your cardiac medications before your next appointment, please call your pharmacy*  Lab Work: TODAY! BMET If you have labs (blood work) drawn today and your tests are completely normal, you will receive your results only by: [name] MyChart Message (if you have MyChart) OR . A paper copy in the mail If you have any lab test that is abnormal or we need to change your treatment, we will call you to review the results.  Testing/Procedures: A CT of your abdomen and pelvis has been ordered per Dr. Ruby Cola.  Follow-Up: Your provider recommends that you schedule a follow-up appointment AS NEEDED with Dr. Acie Fredrickson.   Make sure to keep your appointment with Dr. Haroldine Laws.

## 2020-06-30 ENCOUNTER — Other Ambulatory Visit: Payer: Self-pay | Admitting: Nurse Practitioner

## 2020-07-04 ENCOUNTER — Emergency Department (HOSPITAL_COMMUNITY): Payer: Medicare Other

## 2020-07-04 ENCOUNTER — Other Ambulatory Visit: Payer: Self-pay

## 2020-07-04 ENCOUNTER — Encounter (HOSPITAL_COMMUNITY): Payer: Self-pay | Admitting: Emergency Medicine

## 2020-07-04 ENCOUNTER — Emergency Department (HOSPITAL_COMMUNITY)
Admission: EM | Admit: 2020-07-04 | Discharge: 2020-07-04 | Disposition: A | Discharge status: Home / Self Care | Payer: Medicare Other | Attending: Emergency Medicine | Admitting: Emergency Medicine

## 2020-07-04 DIAGNOSIS — Z79899 Other long term (current) drug therapy: Secondary | ICD-10-CM | POA: Insufficient documentation

## 2020-07-04 DIAGNOSIS — R634 Abnormal weight loss: Secondary | ICD-10-CM | POA: Diagnosis not present

## 2020-07-04 DIAGNOSIS — Z79890 Hormone replacement therapy (postmenopausal): Secondary | ICD-10-CM | POA: Diagnosis not present

## 2020-07-04 DIAGNOSIS — I5022 Chronic systolic (congestive) heart failure: Secondary | ICD-10-CM | POA: Diagnosis not present

## 2020-07-04 DIAGNOSIS — E039 Hypothyroidism, unspecified: Secondary | ICD-10-CM | POA: Diagnosis not present

## 2020-07-04 DIAGNOSIS — R1011 Right upper quadrant pain: Secondary | ICD-10-CM | POA: Insufficient documentation

## 2020-07-04 DIAGNOSIS — K5900 Constipation, unspecified: Secondary | ICD-10-CM | POA: Diagnosis not present

## 2020-07-04 LAB — COMPREHENSIVE METABOLIC PANEL
ALT: 21 U/L (ref 0–44)
AST: 22 U/L (ref 15–41)
Albumin: 3.5 g/dL (ref 3.5–5.0)
Alkaline Phosphatase: 92 U/L (ref 38–126)
Anion gap: 14 (ref 5–15)
BUN: 30 mg/dL — ABNORMAL HIGH (ref 8–23)
CO2: 27 mmol/L (ref 22–32)
Calcium: 8.8 mg/dL — ABNORMAL LOW (ref 8.9–10.3)
Chloride: 100 mmol/L (ref 98–111)
Creatinine, Ser: 1.21 mg/dL (ref 0.61–1.24)
GFR calc Af Amer: >60 mL/min (ref >60)
GFR calc non Af Amer: 59 mL/min — ABNORMAL LOW (ref >60)
Glucose, Bld: 149 mg/dL — ABNORMAL HIGH (ref 70–99)
Potassium: 5.3 mmol/L — ABNORMAL HIGH (ref 3.5–5.1)
Sodium: 141 mmol/L (ref 135–145)
Total Bilirubin: 0.7 mg/dL (ref 0.3–1.2)
Total Protein: 6.5 g/dL (ref 6.5–8.1)

## 2020-07-04 LAB — CBC
HCT: 40 % (ref 39.0–52.0)
Hemoglobin: 12.5 g/dL — ABNORMAL LOW (ref 13.0–17.0)
MCH: 30.3 pg (ref 26.0–34.0)
MCHC: 31.3 g/dL (ref 30.0–36.0)
MCV: 96.9 fL (ref 80.0–100.0)
Platelets: 394 10*3/uL (ref 150–400)
RBC: 4.13 MIL/uL — ABNORMAL LOW (ref 4.22–5.81)
RDW: 12.9 % (ref 11.5–15.5)
WBC: 8.1 10*3/uL (ref 4.0–10.5)
nRBC: 0 % (ref 0.0–0.2)

## 2020-07-04 LAB — LIPASE, BLOOD: Lipase: 20 U/L (ref 11–51)

## 2020-07-04 IMAGING — CT CT ABD-PELV W/ CM
2 of 5 series · 16 of 46 positions shown, 18 images · IV contrast (Omni 300)
Comparison: None.

CLINICAL DATA: Right upper quadrant pain for 2-3 days

EXAM:
CT ABDOMEN AND PELVIS WITH CONTRAST
TECHNIQUE: Multidetector CT imaging of the abdomen and pelvis was performed
using the standard protocol following bolus administration of
intravenous contrast.
CONTRAST:  100mL OMNIPAQUE IOHEXOL 300 MG/ML  SOLN

[Series 3: a/p w/ 5mm · axial · 0.95mm/px · z∈[-1029,-469]mm · 13 of 126 slices shown, 15 images]
[im 7/126  soft-tissue]
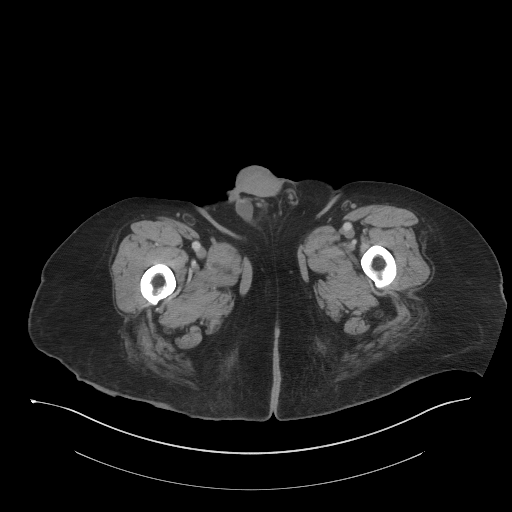
[im 7/126  bone]
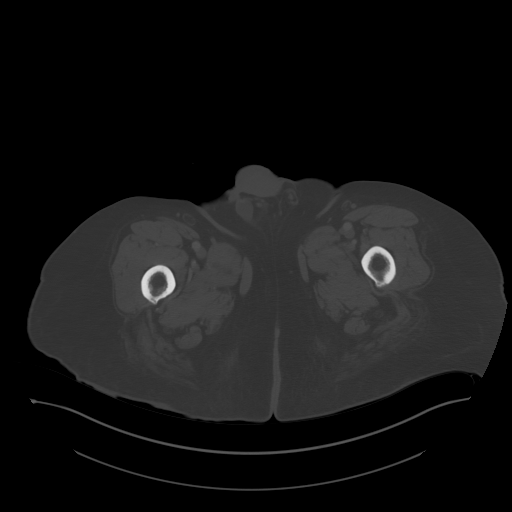
[im 14/126  soft-tissue]
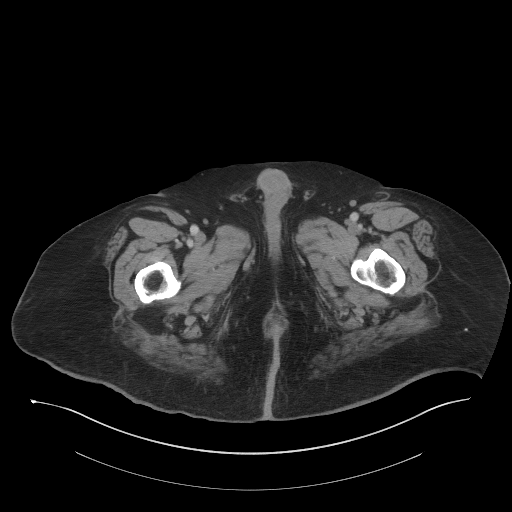
[im 28/126  soft-tissue]
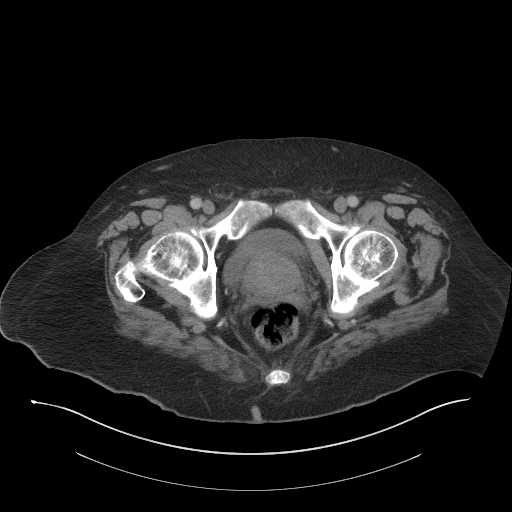
[im 35/126  soft-tissue]
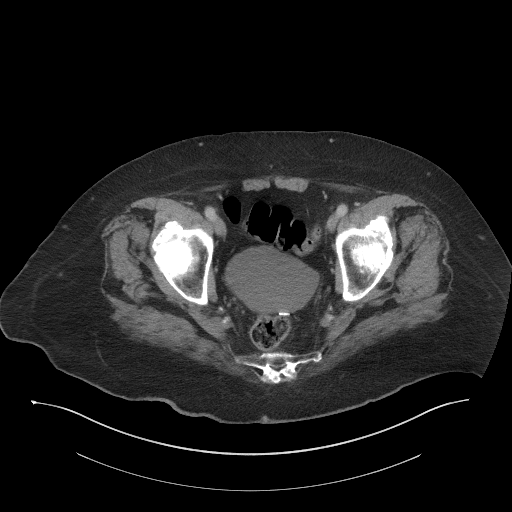
[im 42/126  soft-tissue]
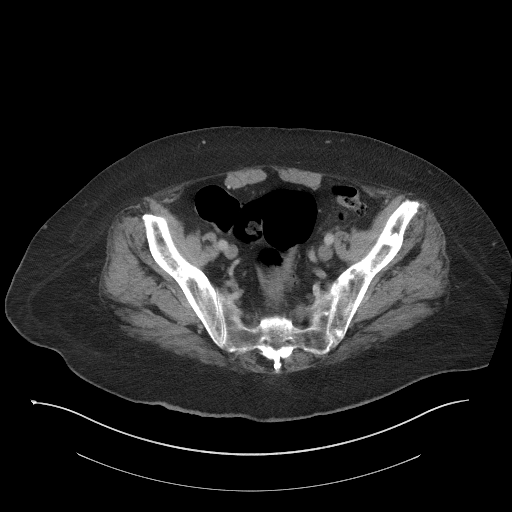
[im 56/126  soft-tissue]
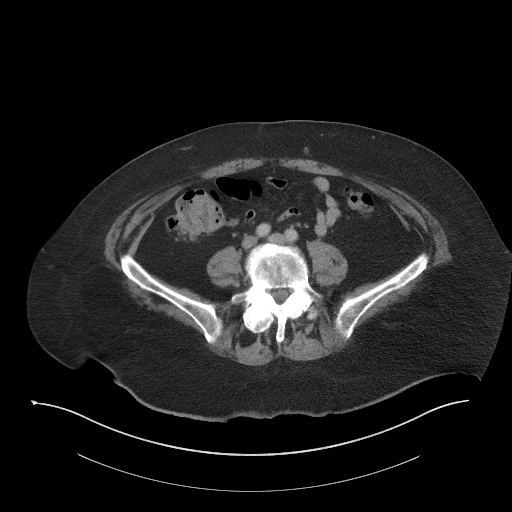
[im 63/126  soft-tissue]
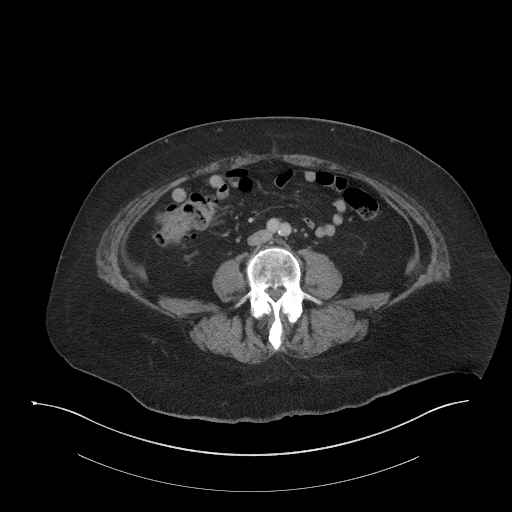
[im 70/126  soft-tissue]
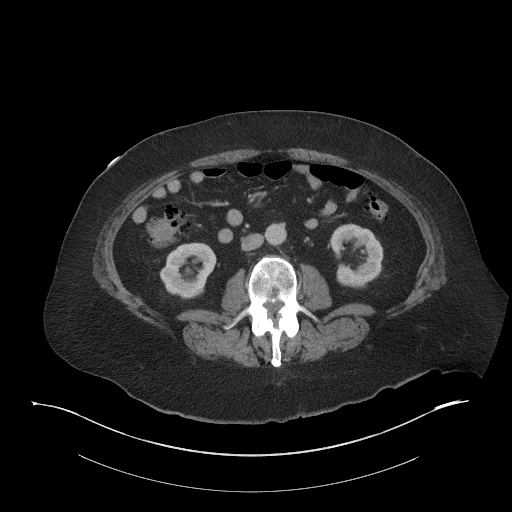
[im 84/126  soft-tissue]
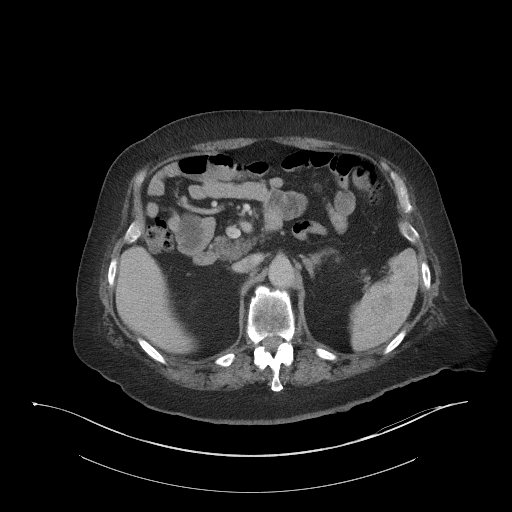
[im 84/126  bone]
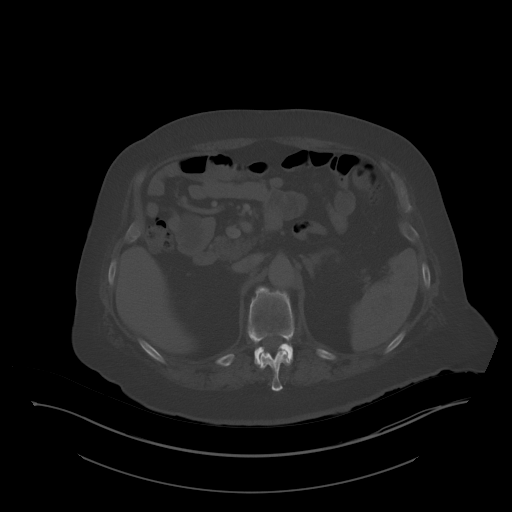
[im 91/126  soft-tissue]
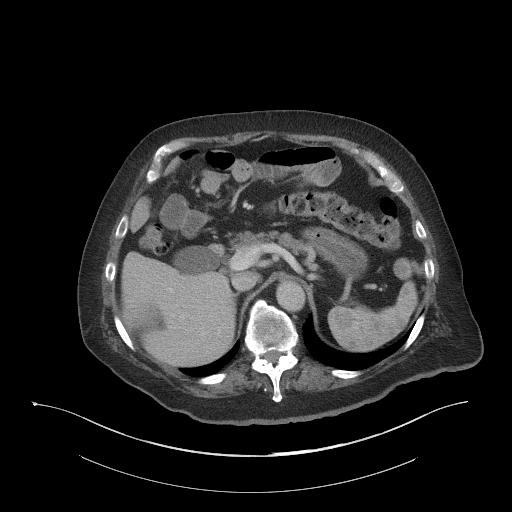
[im 98/126  soft-tissue]
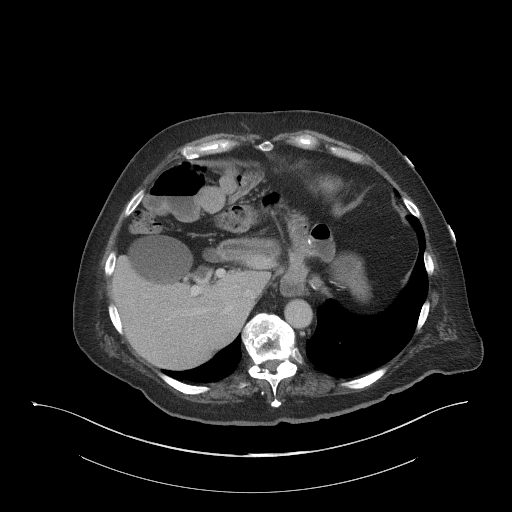
[im 112/126  soft-tissue]
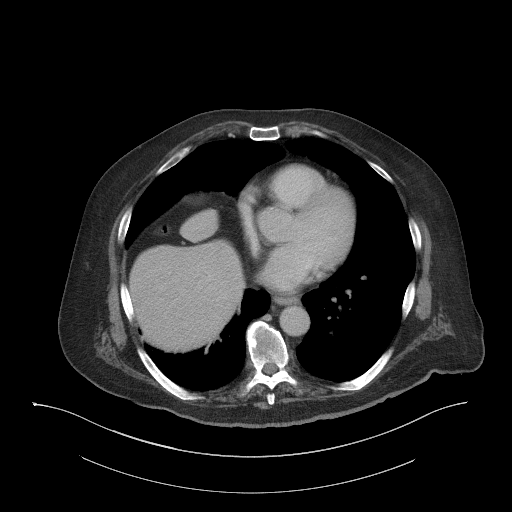
[im 119/126  soft-tissue]
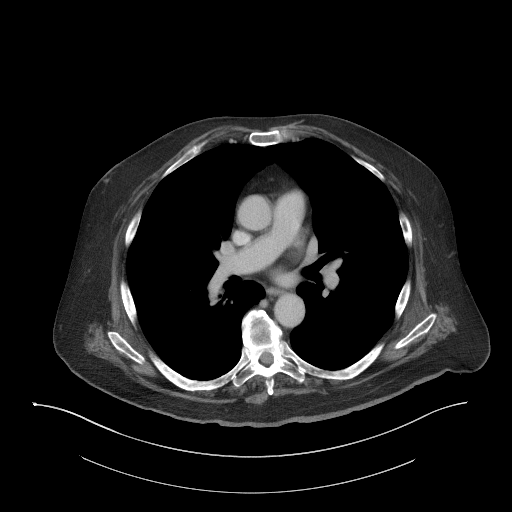

[Series 6: a/p w/ cor · coronal · 1.15mm/px · 3 of 171 slices shown]
[im 57/171  soft-tissue]
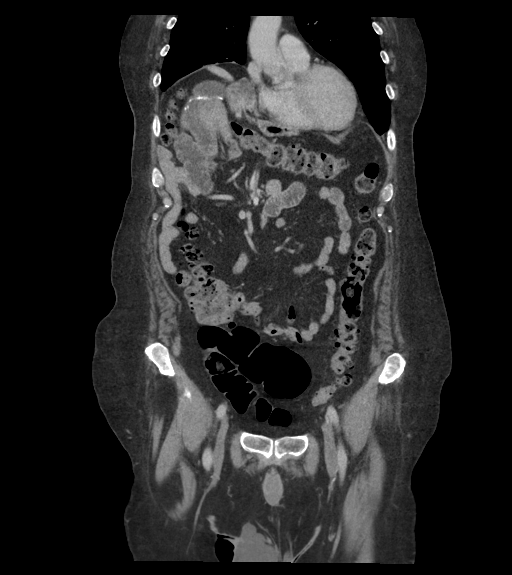
[im 76/171  soft-tissue]
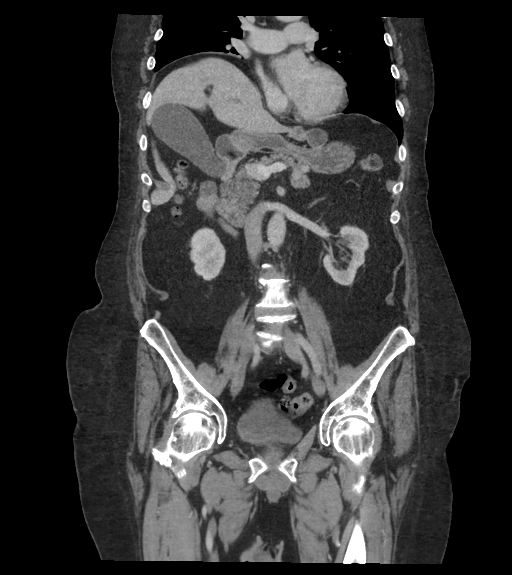
[im 95/171  soft-tissue]
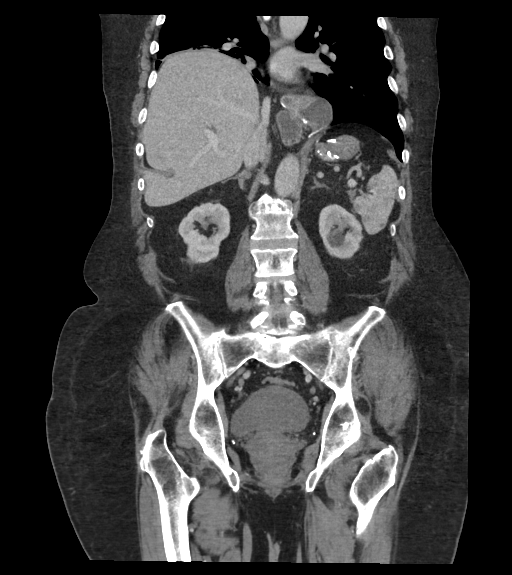

[16 of 46 positions shown; findings below may reference images not displayed]

FINDINGS: Lower chest: Calcified granulomas are noted in both lungs. Mild
scarring is noted in the right lung base.

Hepatobiliary: No focal liver abnormality is seen. No gallstones,
gallbladder wall thickening, or biliary dilatation.

Pancreas: Unremarkable. No pancreatic ductal dilatation or
surrounding inflammatory changes.

Spleen: Multiple small hypodensities are noted on the early phase
images of the spleen likely representing hemangiomas as they are not
well visualized on delayed images.

Adrenals/Urinary Tract: Adrenal glands are within normal limits.
Kidneys demonstrate a normal enhancement pattern bilaterally. Left
renal cyst is noted measuring 2.1 cm in greatest dimension. No
obstructive changes are seen. The bladder is partially distended.

Stomach/Bowel: Scattered diverticular change of the colon is noted
without evidence of diverticulitis. The appendix is within normal
limits. Changes consistent with prior gastric bypass surgery are
noted. A few fluid-filled loops of small bowel are noted without
definitive obstructive change. This may be related to postoperative
adhesions these are concentrated predominately in the right upper
quadrant.

Vascular/Lymphatic: Aortic atherosclerosis. No enlarged abdominal or
pelvic lymph nodes.

Reproductive: Prostate is unremarkable.

Other: No abdominal wall hernia or abnormality. No abdominopelvic
ascites.

Musculoskeletal: No acute or significant osseous findings.
IMPRESSION: Diverticulosis without diverticulitis.

Few fluid-filled loops of small bowel in the right upper quadrant
without definitive obstruction. This may be related to postoperative
adhesions. Uncertain if this corresponds to the patient's clinical
symptomatology.

## 2020-07-04 MED ORDER — IOHEXOL 300 MG/ML  SOLN
100.0000 mL | Freq: Once | INTRAMUSCULAR | Status: AC | PRN
  Administered 2020-07-04: 100 mL via INTRAVENOUS

## 2020-07-04 MED ORDER — LIDOCAINE 5 % EX PTCH
1.0000 | MEDICATED_PATCH | CUTANEOUS | Status: DC
  Administered 2020-07-04: 1 via TRANSDERMAL
  Filled 2020-07-04: qty 1

## 2020-07-04 MED ORDER — TRAMADOL HCL 50 MG PO TABS
50.0000 mg | ORAL_TABLET | Freq: Once | ORAL | Status: AC
  Administered 2020-07-04: 50 mg via ORAL
  Filled 2020-07-04: qty 1

## 2020-07-04 MED ORDER — LIDOCAINE 5 % EX PTCH: 1.0000 | MEDICATED_PATCH | CUTANEOUS | 0 refills | Status: DC

## 2020-07-04 MED ORDER — HYDROCODONE-ACETAMINOPHEN 5-325 MG PO TABS: 1.0000 | ORAL_TABLET | ORAL | 0 refills | Status: DC | PRN

## 2020-07-04 NOTE — ED Notes (Signed)
Pt's IV removed, per Claiborne Billings - RN.

## 2020-07-04 NOTE — ED Notes (Signed)
Transported to CT 

## 2020-07-04 NOTE — ED Notes (Signed)
C/o back pain EDP aware medications given

## 2020-07-04 NOTE — ED Notes (Signed)
Pt stated that back was hurting. Informed Claiborne Billings - RN.

## 2020-07-04 NOTE — ED Provider Notes (Signed)
[facility] EMERGENCY DEPARTMENT Provider Note   CSN: [id number] Arrival date & time: 07/04/20  1036     History Chief Complaint  Patient presents with  . Abdominal Pain    Steven Beard is a 74 y.o. male.  74 year old male with history of CHF (EF 35% IN 2019), history of Roux-en-Y bypass surgery in 2005, presents with complaint of RUQ pain for the past few days. Also reports early satiety, 20lb unintentional weight loss. Pain is worse at night. Reports decreased stool output, baseline constipation but reports decreased output due to eating less. Wife reports easily fatigues, lack of energy. Patient seen by PCP in [place] who checked CRP and SED which were elevated, CT abd/pelvis ordered for this coming Tuesday but pain was worse which prompted ER visit today. Also recently seen by cardiology who decreased medications due to low blood pressure, BP has improved but patient continues to feel weak.        Past Medical History:  Diagnosis Date  . Hidradenitis   . History of BPH    benign prostatic hypertrophy  . History of hydrocele    in right testicle   . Hypothyroidism   . NICM (nonischemic cardiomyopathy) ([place])   . S/P gastric bypass     Patient Active Problem List   Diagnosis Date Noted  . Chronic systolic CHF (congestive heart failure) ([place]) 11/18/2019  . NICM (nonischemic cardiomyopathy) [facility])     Past Surgical History:  Procedure Laterality Date  . CARDIOVASCULAR STRESS TEST     CARDIOLYTE ST LATE 2000's AT [facility] OFFICE REPORTEDLY NEGATIVE FOR ISCHEMIA  . ECHO DOPPLER COMPLETE(TRANSTHOR) ([facility] HX)     11/2010 HT ECHO W/COLORFLOW/SPECTRAL ([facility] HISTORICAL RESULT) LVEF 35%, GLOBAL HK, MILD MR/TR/AR, 2013 STABLE LVEF 35% GLOBAL HK, NO CHANGE C/W 2012, LVEF 40% WITH GLOBAL HK; TRIVIAL MR/AR, NO EFFUSION  . GASTRIC BYPASS     hx  . HYDROCELE EXCISION  2012   REPAIR  . RIGHT/LEFT HEART CATH AND CORONARY ANGIOGRAPHY N/A 05/19/2020   Procedure: RIGHT/LEFT  HEART CATH AND CORONARY ANGIOGRAPHY;  Surgeon: Jettie Booze, MD;  Location: [place] CV LAB;  Service: Cardiovascular;  Laterality: N/A;       Family History  Problem Relation Age of Onset  . Heart disease Mother        family hx; unsure what side  . Heart disease Father        family hx; unsure what side     Social History   Tobacco Use  . Smoking status: Never Smoker  . Smokeless tobacco: Never Used  Vaping Use  . Vaping Use: Never used  Substance Use Topics  . Alcohol use: Yes  . Drug use: Never    Home Medications Prior to Admission medications   Medication Sig Start Date End Date Taking? Authorizing Provider  acetaminophen (TYLENOL) 500 MG tablet Take 1,000 mg by mouth every 6 (six) hours as needed for moderate pain or headache.    [provider]  ALPRAZolam Duanne Moron) 1 MG tablet Take 0.5 mg by mouth at bedtime as needed for anxiety.  03/20/19   [provider]  buPROPion (WELLBUTRIN SR) 150 MG 12 hr tablet Take 150 mg by mouth daily.  04/28/20   [provider]  carvedilol (COREG) 3.125 MG tablet TAKE 1 TABLET BY MOUTH  TWICE DAILY WITH A MEAL 06/29/20   Nahser, Wonda Cheng, MD  finasteride (PROSCAR) 5 MG tablet Take 5 mg by mouth daily.  [provider]  HYDROcodone-acetaminophen (NORCO/VICODIN) 5-325 MG tablet Take 1 tablet by mouth every 4 (four) hours as needed. 07/04/20   Tacy Learn, PA-C  levothyroxine (SYNTHROID, LEVOTHROID) 175 MCG tablet Take 175 mcg by mouth daily before breakfast. 6 days a week    [provider]  lidocaine (LIDODERM) 5 % Place 1 patch onto the skin daily. Remove & Discard patch within 12 hours or as directed by MD 07/04/20   Tacy Learn, PA-C  mirtazapine (REMERON) 7.5 MG tablet Take 7.5 mg by mouth at bedtime. 06/22/20   [provider]  Multiple Vitamins-Minerals (PRESERVISION AREDS) TABS Take 1 tablet by mouth 2 (two) times daily.     [provider]  naproxen sodium  (ALEVE) 220 MG tablet Take 220 mg by mouth daily as needed (back pain).    [provider]  tamsulosin (FLOMAX) 0.4 MG CAPS capsule Take 0.4 mg by mouth daily.    [provider]  traZODone (DESYREL) 50 MG tablet Take 25 mg by mouth at bedtime.     [provider]  VITAMIN D PO Take 1 capsule by mouth daily.    [provider]    Allergies    Minocycline and Codeine  Review of Systems   Review of Systems  Constitutional: Positive for unexpected weight change. Negative for chills, diaphoresis and fever.  Respiratory: Negative for shortness of breath.   Cardiovascular: Negative for chest pain and leg swelling.  Gastrointestinal: Positive for abdominal pain and constipation. Negative for blood in stool, diarrhea, nausea and vomiting.  Genitourinary: Negative for difficulty urinating, dysuria, flank pain, frequency and hematuria.  Musculoskeletal: Positive for back pain.  Skin: Negative for rash and wound.  Allergic/Immunologic: Positive for immunocompromised state.  Neurological: Positive for weakness.  Psychiatric/Behavioral: Negative for confusion.  All other systems reviewed and are negative.   Physical Exam Updated Vital Signs BP 107/75 (BP Location: Left Arm)   Pulse 86   Temp 97.8 F (36.6 C) (Oral)   Resp 18   SpO2 97%   Physical Exam Vitals and nursing note reviewed.  Constitutional:      General: He is not in acute distress.    Appearance: He is well-developed. He is obese. He is not diaphoretic.  HENT:     Head: Normocephalic and atraumatic.  Cardiovascular:     Rate and Rhythm: Normal rate and regular rhythm.     Heart sounds: Normal heart sounds.  Pulmonary:     Effort: Pulmonary effort is normal.     Breath sounds: Normal breath sounds.  Abdominal:     Palpations: Abdomen is soft.     Tenderness: There is no abdominal tenderness. Negative signs include Murphy's sign.  Musculoskeletal:     Right lower leg: No edema.      Left lower leg: No edema.  Skin:    General: Skin is warm and dry.     Findings: No erythema or rash.  Neurological:     Mental Status: He is alert and oriented to person, place, and time.  Psychiatric:        Behavior: Behavior normal.     ED Results / Procedures / Treatments   Labs (all labs ordered are listed, but only abnormal results are displayed) Labs Reviewed  COMPREHENSIVE METABOLIC PANEL - Abnormal; Notable for the following components:      Result Value   Potassium 5.3 (*)    Glucose, Bld 149 (*)    BUN 30 (*)  Calcium 8.8 (*)    GFR calc non Af Amer 59 (*)    All other components within normal limits  CBC - Abnormal; Notable for the following components:   RBC 4.13 (*)    Hemoglobin 12.5 (*)    All other components within normal limits  LIPASE, BLOOD  URINALYSIS, ROUTINE W REFLEX MICROSCOPIC    EKG None  Radiology CT Abdomen Pelvis W Contrast  Result Date: 07/04/2020 CLINICAL DATA:  Right upper quadrant pain for 2-3 days EXAM: CT ABDOMEN AND PELVIS WITH CONTRAST TECHNIQUE: Multidetector CT imaging of the abdomen and pelvis was performed using the standard protocol following bolus administration of intravenous contrast. CONTRAST:  140mL OMNIPAQUE IOHEXOL 300 MG/ML  SOLN COMPARISON:  None. FINDINGS: Lower chest: Calcified granulomas are noted in both lungs. Mild scarring is noted in the right lung base. Hepatobiliary: No focal liver abnormality is seen. No gallstones, gallbladder wall thickening, or biliary dilatation. Pancreas: Unremarkable. No pancreatic ductal dilatation or surrounding inflammatory changes. Spleen: Multiple small hypodensities are noted on the early phase images of the spleen likely representing hemangiomas as they are not well visualized on delayed images. Adrenals/Urinary Tract: Adrenal glands are within normal limits. Kidneys demonstrate a normal enhancement pattern bilaterally. Left renal cyst is noted measuring 2.1 cm in greatest dimension. No  obstructive changes are seen. The bladder is partially distended. Stomach/Bowel: Scattered diverticular change of the colon is noted without evidence of diverticulitis. The appendix is within normal limits. Changes consistent with prior gastric bypass surgery are noted. A few fluid-filled loops of small bowel are noted without definitive obstructive change. This may be related to postoperative adhesions these are concentrated predominately in the right upper quadrant. Vascular/Lymphatic: Aortic atherosclerosis. No enlarged abdominal or pelvic lymph nodes. Reproductive: Prostate is unremarkable. Other: No abdominal wall hernia or abnormality. No abdominopelvic ascites. Musculoskeletal: No acute or significant osseous findings. IMPRESSION: Diverticulosis without diverticulitis. Few fluid-filled loops of small bowel in the right upper quadrant without definitive obstruction. This may be related to postoperative adhesions. Uncertain if this corresponds to the patient's clinical symptomatology. Electronically Signed   By: Inez Catalina M.D.   On: 07/04/2020 13:26    Procedures Procedures (including critical care time)  Medications Ordered in ED Medications  lidocaine (LIDODERM) 5 % 1 patch (has no administration in time range)  iohexol (OMNIPAQUE) 300 MG/ML solution 100 mL (100 mLs Intravenous Contrast Given 07/04/20 1250)  traMADol (ULTRAM) tablet 50 mg (50 mg Oral Given 07/04/20 1331)    ED Course  I have reviewed the triage vital signs and the nursing notes.  Pertinent labs & imaging results that were available during my care of the patient were reviewed by me and considered in my medical decision making (see chart for details).  Clinical Course as of Jul 04 1416  Sun Jul 04, 2146  7080 74 year old male with complaint of right upper quadrant abdominal pain with back pain.  On exam, abdomen is soft and nontender, negative Murphy sign.  Labs without significant findings include CBC, CMP, lipase.  CT  abdomen pelvis with a few dilated loops of bowel in the right upper quadrant however no obvious obstruction, no symptoms to suggest obstruction.  Patient referred to GI for follow-up, also recommend he follow-up with his PCP.  Will place Lidoderm patch in his back for possible musculoskeletal pain, given prescription for Norco and advised to take Colace.  Return precautions were given.   [LM]    Clinical Course User Index [LM] Tacy Learn, PA-C  MDM Rules/Calculators/A&P                          Final Clinical Impression(s) / ED Diagnoses Final diagnoses:  Right upper quadrant abdominal pain    Rx / DC Orders ED Discharge Orders         Ordered    HYDROcodone-acetaminophen (NORCO/VICODIN) 5-325 MG tablet  Every 4 hours PRN        07/04/20 1411    lidocaine (LIDODERM) 5 %  Every 24 hours        07/04/20 1411           Tacy Learn, PA-C 07/04/20 1417    Veryl Speak, MD 07/07/20 1340

## 2020-07-04 NOTE — ED Triage Notes (Signed)
Pt reports intermittent RUQ pain that radiates to back x 2-3 days.  Denies nausea, vomiting, and diarrhea.

## 2020-07-04 NOTE — Discharge Instructions (Addendum)
Follow-up with your primary care provider as discussed.  Return to ER for severe concerning symptoms.  Contact GI as referred for follow-up. Take Norco as needed as prescribed for pain.  This can cause constipation, be sure to take Colace as directed. Apply Lidoderm patch to right low back as prescribed.

## 2020-07-06 ENCOUNTER — Ambulatory Visit (HOSPITAL_COMMUNITY): Payer: Medicare Other

## 2020-07-06 ENCOUNTER — Encounter (HOSPITAL_COMMUNITY): Payer: Self-pay

## 2020-07-08 ENCOUNTER — Encounter (HOSPITAL_COMMUNITY): Payer: Self-pay | Admitting: Internal Medicine

## 2020-07-08 ENCOUNTER — Ambulatory Visit (HOSPITAL_COMMUNITY)
Admission: RE | Admit: 2020-07-08 | Discharge: 2020-07-08 | Disposition: A | Discharge status: Home / Self Care | Payer: Medicare Other | Source: Ambulatory Visit | Attending: Internal Medicine | Admitting: Internal Medicine

## 2020-07-08 ENCOUNTER — Other Ambulatory Visit: Payer: Self-pay

## 2020-07-08 VITALS — BP 92/58 | HR 83 | Ht 71.0 in | Wt 235.6 lb

## 2020-07-08 DIAGNOSIS — R0683 Snoring: Secondary | ICD-10-CM

## 2020-07-08 DIAGNOSIS — I428 Other cardiomyopathies: Secondary | ICD-10-CM | POA: Insufficient documentation

## 2020-07-08 DIAGNOSIS — N400 Enlarged prostate without lower urinary tract symptoms: Secondary | ICD-10-CM | POA: Insufficient documentation

## 2020-07-08 DIAGNOSIS — I5022 Chronic systolic (congestive) heart failure: Secondary | ICD-10-CM | POA: Diagnosis not present

## 2020-07-08 DIAGNOSIS — G4733 Obstructive sleep apnea (adult) (pediatric): Secondary | ICD-10-CM | POA: Diagnosis not present

## 2020-07-08 DIAGNOSIS — E119 Type 2 diabetes mellitus without complications: Secondary | ICD-10-CM | POA: Diagnosis not present

## 2020-07-08 DIAGNOSIS — E039 Hypothyroidism, unspecified: Secondary | ICD-10-CM | POA: Insufficient documentation

## 2020-07-08 DIAGNOSIS — Z79890 Hormone replacement therapy (postmenopausal): Secondary | ICD-10-CM | POA: Diagnosis not present

## 2020-07-08 DIAGNOSIS — I2510 Atherosclerotic heart disease of native coronary artery without angina pectoris: Secondary | ICD-10-CM | POA: Insufficient documentation

## 2020-07-08 DIAGNOSIS — Z9884 Bariatric surgery status: Secondary | ICD-10-CM | POA: Diagnosis not present

## 2020-07-08 DIAGNOSIS — Z79899 Other long term (current) drug therapy: Secondary | ICD-10-CM | POA: Diagnosis not present

## 2020-07-08 DIAGNOSIS — I951 Orthostatic hypotension: Secondary | ICD-10-CM | POA: Insufficient documentation

## 2020-07-08 LAB — T4, FREE: Free T4: 1.21 ng/dL — ABNORMAL HIGH (ref 0.61–1.12)

## 2020-07-08 LAB — VITAMIN B12: Vitamin B-12: 289 pg/mL (ref 180–914)

## 2020-07-08 LAB — FOLATE: Folate: 48.1 ng/mL (ref >5.9)

## 2020-07-08 LAB — TSH: TSH: 0.358 u[IU]/mL (ref 0.350–4.500)

## 2020-07-08 NOTE — Progress Notes (Signed)
ADVANCED HF CLINIC CONSULT NOTE  Referring Physician: Primary Care: Primary Cardiologist: Nahser  HPI:  Steven Beard is a 74 y.o. male with morbid obesity s/p gastric bypass, DM2, h/o goiter s/p thyroid resection, systolic HF due to NICM referred by Dr. Acie Fredrickson for further evaluation of systolic HF.   Previously followed by Dr. Benetta Spar in [place] and moved to [place] and established with Dr. Acie Fredrickson in 2017.  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 First told he had a low EF in 2005 after gastric bypass. Was referred to Dr. Benetta Spar. Did not have a cath at that time. Moved to [place] in 2017.   He has not been very active. Has had poor appetite and has been losing weight. ~20 pounds this year. Sleesp a lot. No energy. Wife says he snores. Had OSA prior to gastric bypass but not checked recently.DOesn't wear CPAP. BP has been low and unable to titrate HF meds. Previously on lisinopril, spiro, Entresto and digoxin but stopped due to symptomatic hypotension. Only on carvedilol 3.125 bid. Last HgBa1c 7.0 with diet control. Denies edema, orthopnea or PND. Dizzy when standing often and recently had transient spell of unresponsiveness with possible seizure when standing   Echo 4/21 EF 30-35% RV ok.  CT a/P 07/04/20: unremarkable  Cath 05/19/20 EF 25-35% Minimal nonobstructive CAD RA = 7 PA = 40/17 (26)  PCWP 15 CO/CI 7.8/3.4   Review of Systems: [y] = yes, [ ]  = no   General: Weight gain [ ] ; Weight loss [ ] ; Anorexia [ ] ; Fatigue [ y]; Fever [ ] ; Chills [ ] ; Weakness [ ]   Cardiac: Chest pain/pressure [ ] ; Resting SOB [ ] ; Exertional SOB Blue.Reese ]; Orthopnea [ ] ; Pedal Edema [ ] ; Palpitations [ ] ; Syncope [ ] ; Presyncope [ ] ; Paroxysmal nocturnal dyspnea[ ]   Pulmonary: Cough [ ] ; Wheezing[ ] ; Hemoptysis[ ] ; Sputum [ ] ; Snoring [ ]   GI: Vomiting[ ] ; Dysphagia[ ] ; Melena[ ] ; Hematochezia [ ] ; Heartburn[ ] ; Abdominal pain [ ] ; Constipation [ ] ; Diarrhea [ ] ; BRBPR [ ]   GU: Hematuria[ ] ;  Dysuria [ ] ; Nocturia[ ]   Vascular: Pain in legs with walking [ ] ; Pain in feet with lying flat [ ] ; Non-healing sores [ ] ; Stroke [ ] ; TIA [ ] ; Slurred speech [ ] ;  Neuro: Headaches[ ] ; Vertigo[ ] ; Seizures[ ] ; Paresthesias[ ] ;Blurred vision [ ] ; Diplopia [ ] ; Vision changes [ ]   Ortho/Skin: Arthritis [ y]; Joint pain [ y]; Muscle pain [ ] ; Joint swelling [ ] ; Back Pain [ ] ; Rash [ ]   Psych: Depression[ ] ; Anxiety[ ]   Heme: Bleeding problems [ ] ; Clotting disorders [ ] ; Anemia [ ]   Endocrine: Diabetes [ y]; Thyroid dysfunction[ ]    Past Medical History:  Diagnosis Date  . Hidradenitis   . History of BPH    benign prostatic hypertrophy  . History of hydrocele    in right testicle   . Hypothyroidism   . NICM (nonischemic cardiomyopathy) ([place])   . S/P gastric bypass     Current Outpatient Medications  Medication Sig Dispense Refill  . acetaminophen (TYLENOL) 500 MG tablet Take 1,000 mg by mouth every 6 (six) hours as needed for moderate pain or headache.    . ALPRAZolam (XANAX) 1 MG tablet Take 0.5 mg by mouth at bedtime as needed for anxiety.     [name] buPROPion (WELLBUTRIN SR) 150 MG 12 hr tablet Take 150 mg by mouth daily.     . carvedilol (COREG) 3.125  MG tablet TAKE 1 TABLET BY MOUTH  TWICE DAILY WITH A MEAL 180 tablet 3  . finasteride (PROSCAR) 5 MG tablet Take 5 mg by mouth daily.    [name] levothyroxine (SYNTHROID, LEVOTHROID) 175 MCG tablet Take 175 mcg by mouth daily before breakfast. 6 days a week    . Multiple Vitamins-Minerals (PRESERVISION AREDS) TABS Take 1 tablet by mouth 2 (two) times daily.     . naproxen sodium (ALEVE) 220 MG tablet Take 220 mg by mouth daily as needed (back pain).    . tamsulosin (FLOMAX) 0.4 MG CAPS capsule Take 0.4 mg by mouth daily.    . traZODone (DESYREL) 50 MG tablet Take 25 mg by mouth at bedtime.     [name] VITAMIN D PO Take 1 capsule by mouth daily.    [name] HYDROcodone-acetaminophen (NORCO/VICODIN) 5-325 MG tablet Take 1 tablet by mouth every 4 (four)  hours as needed. (Patient not taking: Reported on 07/08/2020) 10 tablet 0  . lidocaine (LIDODERM) 5 % Place 1 patch onto the skin daily. Remove & Discard patch within 12 hours or as directed by MD (Patient not taking: Reported on 07/08/2020) 30 patch 0  . mirtazapine (REMERON) 7.5 MG tablet Take 7.5 mg by mouth at bedtime. (Patient not taking: Reported on 07/08/2020)     No current facility-administered medications for this encounter.    Allergies  Allergen Reactions  . Minocycline Hives  . Codeine Itching      Social History   Socioeconomic History  . Marital status: Married    Spouse name: Not on file  . Number of children: Not on file  . Years of education: Not on file  . Highest education level: Not on file  Occupational History  . Not on file  Tobacco Use  . Smoking status: Never Smoker  . Smokeless tobacco: Never Used  Vaping Use  . Vaping Use: Never used  Substance and Sexual Activity  . Alcohol use: Yes    Alcohol/week: 1.0 standard drink    Types: 1 Glasses of wine per week    Comment: occ   . Drug use: Never  . Sexual activity: Not on file  Other Topics Concern  . Not on file  Social History Narrative  . Not on file   Social Determinants of Health   Financial Resource Strain:   . Difficulty of Paying Living Expenses: Not on file  Food Insecurity:   . Worried About Charity fundraiser in the Last Year: Not on file  . Ran Out of Food in the Last Year: Not on file  Transportation Needs:   . Lack of Transportation (Medical): Not on file  . Lack of Transportation (Non-Medical): Not on file  Physical Activity:   . Days of Exercise per Week: Not on file  . Minutes of Exercise per Session: Not on file  Stress:   . Feeling of Stress : Not on file  Social Connections:   . Frequency of Communication with Friends and Family: Not on file  . Frequency of Social Gatherings with Friends and Family: Not on file  . Attends Religious Services: Not on file  . Active  Member of Clubs or Organizations: Not on file  . Attends Archivist Meetings: Not on file  . Marital Status: Not on file  Intimate Partner Violence:   . Fear of Current or Ex-Partner: Not on file  . Emotionally Abused: Not on file  . Physically Abused: Not on file  . Sexually Abused:  Not on file      Family History  Problem Relation Age of Onset  . Heart disease Mother        family hx; unsure what side  . Heart disease Father        family hx; unsure what side     Vitals:   07/08/20 1112  BP: (!) 92/58  Pulse: 83  SpO2: 96%  Weight: 106.9 kg (235 lb 9.6 oz)  Height: 5\' 11"  (1.803 m)    Orthostatics done personally  Seated 116/78 Standing 88/62   PHYSICAL EXAM: General:  Well appearing. No respiratory difficulty HEENT: normal Neck: supple. no JVD. Carotids 2+ bilat; no bruits. No lymphadenopathy or thryomegaly appreciated. Cor: PMI nondisplaced. Regular rate & rhythm. No rubs, gallops or murmurs. Lungs: clear Abdomen: obese soft, nontender, nondistended. No hepatosplenomegaly. No bruits or masses. Good bowel sounds. Extremities: no cyanosis, clubbing, rash, edema Neuro: alert & oriented x 3, cranial nerves grossly intact. moves all 4 extremities w/o difficulty. Affect pleasant.  ECG: NSR 82 nonspecific ST abnormalities. QRS 61ms Personally reviewed   ASSESSMENT & PLAN:  1. Orthostatic hypotension with probable autonomic dysfunction - I suspect low BP may be a big component of his symptoms as he says he has felt better when BP meds were stopped  - I checked his orthostatics personally and he dropped nearly 30 points here - Will proceed with conservative measures first. Liberalize salt intake and place compression hose - Will have his wife check his sitting/standing BPs for 2 weeks and if still markeld orthostatic will start midodrine - Will get cardiac MRI and genetic testing to look for evidence of possible TTR cardiomyopathy which can also be associated  with orthostatic hypotension - Will check labs to assess thyroid function, B12 and other serology. No convincing evidence for adrenal insufficiency at this point  2. Chronic systolic HF due to NICM - Echo 4/21 EF 30-35% - Cath 8/21 EF 25-30% minimal CAD RHC well compensated - NYHA III but suspect majority of symptoms may be related to orthostatic hypotension - volume status ok - GDMT titration limited by low BP.  - Will get cMRI - Can consider CPX once BP issues more stable  3. Snoring with h/o OSA - given snoring and daytime fatigue will need sleep study. Home sleep study ordered  4. DM2 - Per PCP - cn eventually consider SGLT2i but avoid for now as fluid loss will worsen orthostasis   5. Morbid obesity s/p gastric bypass - malabsorption may be contributing to micronutrient deficiencies ann exacerbate orthostasis. - check labs. May need Nutrition to see  Total time spent 55 minutes. Over half that time spent discussing above.    Glori Bickers, MD  12:17 PM

## 2020-07-08 NOTE — Patient Instructions (Signed)
Labs done today, your results will be available in MyChart, we will contact you for abnormal readings.  Genetic test has been done, this has to be sent to [place] to be processed and can take 1-2 weeks to get results back.  We will let you know the results.  Please check your BP sitting and standing twice a day, please get [place] these readings in 2 weeks, you can do that several different ways:   --Drop off readings at our front desk  --Send the information through [place]  --Call our office and give the information to a nurse  Please wear your compression hose daily, place them on as soon as you get up in the morning and remove before you go to bed at night.  Your physician has requested that you have a cardiac MRI. Cardiac MRI uses a computer to create images of your heart as its beating, producing both still and moving pictures of your heart and major blood vessels. For further information please visit [email or web address] Please follow the instruction sheet given to you today for more information. ONCE APPROVED WITH YOUR INSURANCE COMPANY YOU WILL BE CONTACTED TO SCHEDULE THIS  Your provider has recommended that you have a home sleep study. Once you have completed the test you just dispose of the equipment, the information is automatically uploaded to [place] via blue-tooth technology.  If your test is positive for sleep apnea and you need a home CPAP machine you will be contacted by Dr Theodosia Blender office [facility]) to set this up.  Your physician recommends that you schedule a follow-up appointment in: 2 months  If you have any questions or concerns before your next appointment please send [place] a message through [place] or call our office at [phone].    TO LEAVE A MESSAGE FOR THE NURSE SELECT OPTION 2, PLEASE LEAVE A MESSAGE INCLUDING: . YOUR NAME . DATE OF BIRTH . CALL BACK NUMBER . REASON FOR CALL**this is important as we prioritize the call backs  [place] AS LONG AS YOU CALL BEFORE 4:00 PM  At the [facility], you and your health needs are our priority. As part of our continuing mission to provide you with exceptional heart care, we have created designated Provider Care Teams. These Care Teams include your primary Cardiologist (physician) and Advanced Practice Providers (APPs- Physician Assistants and Nurse Practitioners) who all work together to provide you with the care you need, when you need it.   You may see any of the following providers on your designated Care Team at your next follow up: [name] Dr Glori Bickers . Dr Loralie Champagne . Darrick Grinder, NP . Lyda Jester, PA . Audry Riles, PharmD   Please be sure to bring in all your medications bottles to every appointment.

## 2020-07-08 NOTE — Progress Notes (Signed)
Blood collected for TTR genetic testing per Dr Haroldine Laws.  Order form completed and both shipped by [employer] to [facility].  Patient given Jaynie Crumble WP1 test to take home and use, all information reviewed with patient and wife

## 2020-07-09 LAB — T3, FREE: T3, Free: 2.7 pg/mL (ref 2.0–4.4)

## 2020-07-12 DIAGNOSIS — I959 Hypotension, unspecified: Secondary | ICD-10-CM | POA: Insufficient documentation

## 2020-07-12 LAB — MULTIPLE MYELOMA PANEL, SERUM
Albumin SerPl Elph-Mcnc: 3.5 g/dL (ref 2.9–4.4)
Albumin/Glob SerPl: 1.3 (ref 0.7–1.7)
Alpha 1: 0.2 g/dL (ref 0.0–0.4)
Alpha2 Glob SerPl Elph-Mcnc: 0.8 g/dL (ref 0.4–1.0)
B-Globulin SerPl Elph-Mcnc: 0.9 g/dL (ref 0.7–1.3)
Gamma Glob SerPl Elph-Mcnc: 0.8 g/dL (ref 0.4–1.8)
Globulin, Total: 2.7 g/dL (ref 2.2–3.9)
IgA: 245 mg/dL (ref 61–437)
IgG (Immunoglobin G), Serum: 830 mg/dL (ref 603–1613)
IgM (Immunoglobulin M), Srm: 53 mg/dL (ref 15–143)
Total Protein ELP: 6.2 g/dL (ref 6.0–8.5)

## 2020-07-13 ENCOUNTER — Encounter (HOSPITAL_COMMUNITY): Payer: Self-pay

## 2020-07-15 ENCOUNTER — Encounter (HOSPITAL_COMMUNITY): Payer: Self-pay

## 2020-07-23 ENCOUNTER — Encounter (HOSPITAL_COMMUNITY): Payer: Self-pay

## 2020-07-23 ENCOUNTER — Other Ambulatory Visit (HOSPITAL_COMMUNITY): Payer: Self-pay | Admitting: *Deleted

## 2020-07-23 MED ORDER — DIAZEPAM 5 MG PO TABS: ORAL_TABLET | ORAL | 0 refills | Status: DC

## 2020-07-23 MED ORDER — MIDODRINE HCL 2.5 MG PO TABS: 2.5000 mg | ORAL_TABLET | Freq: Three times a day (TID) | ORAL | 3 refills | Status: DC

## 2020-07-23 NOTE — Telephone Encounter (Signed)
Start midodrine 2.5 tid.   Give valium 2.5 for MRI   thanks

## 2020-07-23 NOTE — Telephone Encounter (Signed)
Let us know if SBP > 160

## 2020-07-26 ENCOUNTER — Encounter (HOSPITAL_COMMUNITY): Payer: Self-pay

## 2020-07-26 ENCOUNTER — Other Ambulatory Visit (HOSPITAL_COMMUNITY): Payer: Self-pay | Admitting: *Deleted

## 2020-07-26 MED ORDER — MIDODRINE HCL 2.5 MG PO TABS
2.5000 mg | ORAL_TABLET | Freq: Three times a day (TID) | ORAL | 3 refills | Status: DC
Start: 2020-07-26 — End: 2020-11-18

## 2020-07-26 NOTE — Telephone Encounter (Signed)
Just follow his BP it should be fine. It is low dose.

## 2020-07-30 ENCOUNTER — Telehealth (HOSPITAL_COMMUNITY): Payer: Self-pay | Admitting: Emergency Medicine

## 2020-07-30 NOTE — Telephone Encounter (Signed)
Reaching out to patient to offer assistance regarding upcoming cardiac imaging study; pt verbalizes understanding of appt date/time, parking situation and where to check in,and verified current allergies; name and call back number provided for further questions should they arise Marchia Bond RN Navigator Cardiac Imaging [facility] [phone] office [phone] cell   Pt claustro and was not able to get valium prescribed by [facility] clinic (was a print script and not e-prescribed).   Pt has xanax available at home and suggested to take 30 mins prior to check in. Wife will be driving.   Pt denies metal implants  Clarise Cruz

## 2020-08-02 ENCOUNTER — Other Ambulatory Visit: Payer: Self-pay

## 2020-08-02 ENCOUNTER — Ambulatory Visit (HOSPITAL_COMMUNITY)
Admission: RE | Admit: 2020-08-02 | Discharge: 2020-08-02 | Disposition: A | Discharge status: Home / Self Care | Payer: Medicare Other | Source: Ambulatory Visit | Attending: Internal Medicine | Admitting: Internal Medicine

## 2020-08-02 DIAGNOSIS — I428 Other cardiomyopathies: Secondary | ICD-10-CM | POA: Insufficient documentation

## 2020-08-02 IMAGING — MR MR CARD MORPHOLOGY WO/W CM
45 of 48 series · 45 of 48 positions shown · IV contrast (Contrast agent)
Comparison: none

EXAM:
CARDIAC MRI
TECHNIQUE: The patient was scanned on a 1.5 Tesla GE magnet. A dedicated
cardiac coil was used. Functional imaging was done using Fiesta
sequences. [DATE], and 4 chamber views were done to assess for RWMA's.
Modified KECA rule using a short axis stack was used to
calculate an ejection fraction on a dedicated work station using
Circle software. The patient received 10 cc of Gadavist. After 10
minutes inversion recovery sequences were used to assess for
infiltration and scar tissue.

CONTRAST:  Gadavist 10 cc

[Series 4: [id number] · axial · 8.0mm · 1.41mm/px · 1 of 16 slices shown]
[im 1/16]
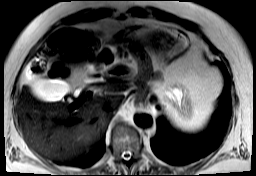

[Series 8: bSSFP · oblique · 8.0mm · 1.61mm/px · 1 of 25 slices shown (1 of 22)]
[im 1/25]
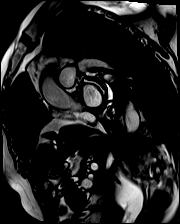

[Series 9: bSSFP · oblique · 8.0mm · 1.61mm/px · 1 of 25 slices shown (2 of 22)]
[im 1/25]
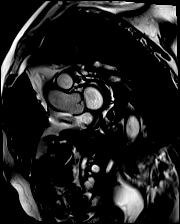

[Series 10: bSSFP · oblique · 8.0mm · 1.61mm/px · 1 of 25 slices shown (3 of 22)]
[im 1/25]
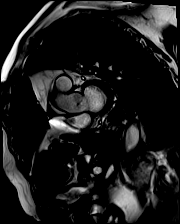

[Series 11: bSSFP · oblique · 8.0mm · 1.61mm/px · 1 of 25 slices shown (4 of 22)]
[im 1/25]
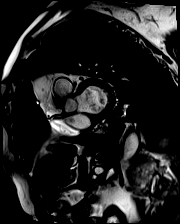

[Series 12: bSSFP · oblique · 8.0mm · 1.61mm/px · 1 of 25 slices shown (5 of 22)]
[im 1/25]
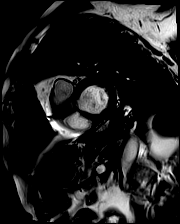

[Series 13: bSSFP · oblique · 8.0mm · 1.61mm/px · 1 of 25 slices shown (6 of 22)]
[im 1/25]
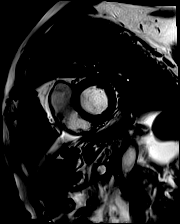

[Series 14: bSSFP · oblique · 8.0mm · 1.61mm/px · 1 of 25 slices shown (7 of 22)]
[im 1/25]
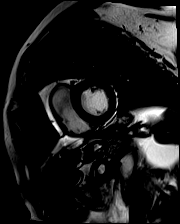

[Series 15: bSSFP · oblique · 8.0mm · 1.61mm/px · 1 of 25 slices shown (8 of 22)]
[im 1/25]
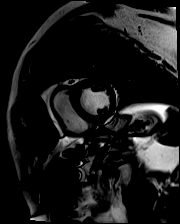

[Series 16: bSSFP · oblique · 8.0mm · 1.61mm/px · 1 of 25 slices shown (9 of 22)]
[im 1/25]
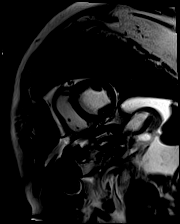

[Series 17: bSSFP · oblique · 8.0mm · 1.61mm/px · 1 of 25 slices shown (10 of 22)]
[im 1/25]
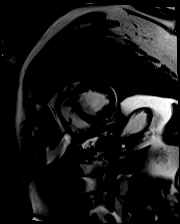

[Series 18: bSSFP · oblique · 8.0mm · 1.61mm/px · 1 of 25 slices shown (11 of 22)]
[im 1/25]
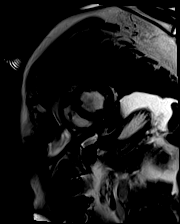

[Series 19: bSSFP · oblique · 8.0mm · 1.61mm/px · 1 of 25 slices shown (12 of 22)]
[im 1/25]
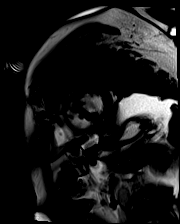

[Series 20: bSSFP · oblique · 8.0mm · 1.61mm/px · 1 of 25 slices shown (13 of 22)]
[im 1/25]
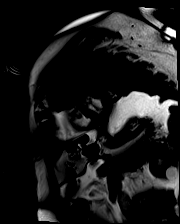

[Series 21: bSSFP · oblique · 8.0mm · 1.61mm/px · 1 of 25 slices shown (14 of 22)]
[im 1/25]
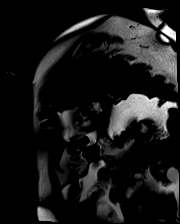

[Series 22: bSSFP · oblique · 8.0mm · 1.61mm/px · 1 of 25 slices shown (15 of 22)]
[im 1/25]
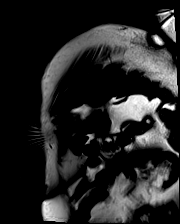

[Series 23: bSSFP · oblique · 8.0mm · 1.61mm/px · 1 of 25 slices shown (16 of 22)]
[im 1/25]
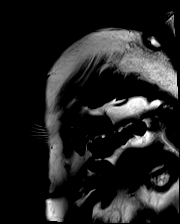

[Series 24: STIR · oblique · 8.0mm · 1.92mm/px · 1 of 15 slices shown]
[im 1/15]
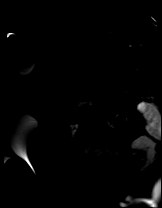

[Series 25: bSSFP · axial · 6.0mm · 1.41mm/px · 1 of 25 slices shown (17 of 22)]
[im 1/25]
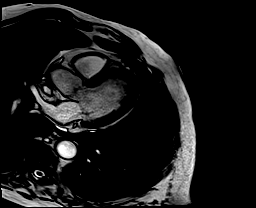

[Series 26: bSSFP · sagittal · 6.0mm · 1.41mm/px · 1 of 25 slices shown (18 of 22)]
[im 1/25]
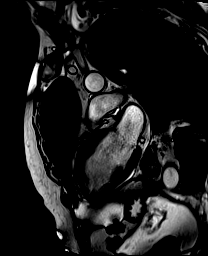

[Series 27: bSSFP · oblique · 6.0mm · 1.41mm/px · 1 of 25 slices shown (19 of 22)]
[im 1/25]
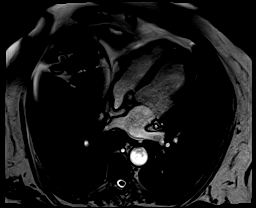

[Series 28: (id)_long_t1 · oblique · 8.0mm · 1.56mm/px · 1 of 24 slices shown]
[im 1/24]
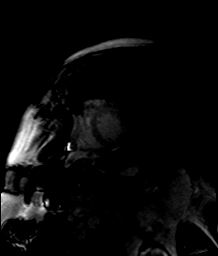

[Series 29: (id)_long_t1_moco · oblique · 8.0mm · 1.56mm/px · 1 of 24 slices shown]
[im 1/24]
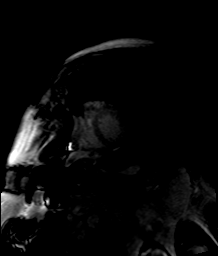

[Series 30: (id)_long_t1_moco_t1 · oblique · 8.0mm · 1.56mm/px · 1 of 6 slices shown]
[im 1/6]
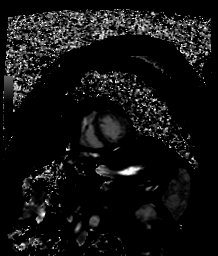

[Series 32: (id)_trufi · oblique · 8.0mm · 2.08mm/px · 1 of 9 slices shown]
[im 1/9]
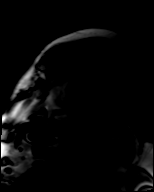

[Series 33: (id)_trufi_moco · oblique · 8.0mm · 2.08mm/px · 1 of 9 slices shown]
[im 1/9]
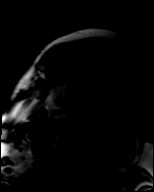

[Series 34: (id)_trufi_moco_t2 · oblique · 8.0mm · 2.08mm/px · 1 of 3 slices shown]
[im 1/3]
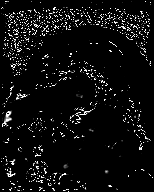

[Series 36: pre short axis · oblique · non-contrast · 8.0mm · 2.25mm/px · 1 of 10 slices shown (1 of 6)]
[im 1/10]
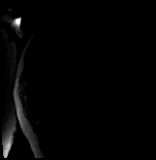

[Series 37: pre short axis · oblique · non-contrast · 8.0mm · 2.25mm/px · 1 of 10 slices shown (2 of 6)]
[im 1/10]
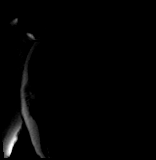

[Series 38: pre short axis · oblique · non-contrast · 8.0mm · 2.25mm/px · 1 of 10 slices shown (3 of 6)]
[im 1/10]
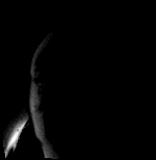

[Series 39: pre short axis · oblique · non-contrast · 8.0mm · 2.25mm/px · 1 of 10 slices shown (4 of 6)]
[im 1/10]
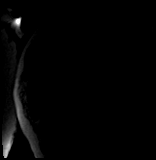

[Series 40: pre short axis · oblique · non-contrast · 8.0mm · 2.25mm/px · 1 of 10 slices shown (5 of 6)]
[im 1/10]
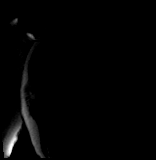

[Series 41: pre short axis · oblique · non-contrast · 8.0mm · 2.25mm/px · 1 of 10 slices shown (6 of 6)]
[im 1/10]
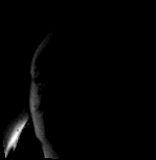

[Series 42: rest short axis · oblique · 8.0mm · 2.25mm/px · 1 of 80 slices shown (1 of 6)]
[im 1/80]
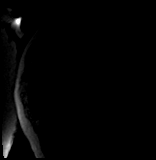

[Series 43: rest short axis · oblique · 8.0mm · 2.25mm/px · 1 of 80 slices shown (2 of 6)]
[im 1/80]
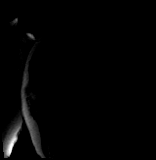

[Series 44: rest short axis · oblique · 8.0mm · 2.25mm/px · 1 of 80 slices shown (3 of 6)]
[im 1/80]
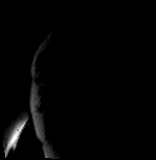

[Series 45: rest short axis · oblique · 8.0mm · 2.25mm/px · 1 of 80 slices shown (4 of 6)]
[im 1/80]
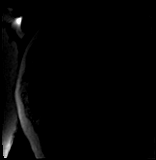

[Series 46: rest short axis · oblique · 8.0mm · 2.25mm/px · 1 of 80 slices shown (5 of 6)]
[im 1/80]
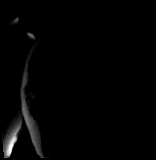

[Series 47: rest short axis · oblique · 8.0mm · 2.25mm/px · 1 of 80 slices shown (6 of 6)]
[im 1/80]
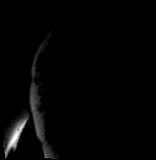

[Series 48: bSSFP · oblique · 8.0mm · 1.73mm/px · 1 of 16 slices shown (20 of 22)]
[im 1/16]
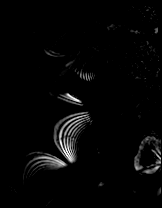

[Series 49: bSSFP · oblique · 8.0mm · 1.73mm/px · 1 of 16 slices shown (21 of 22)]
[im 1/16]
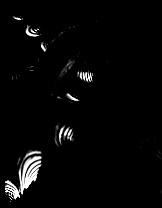

[Series 50: bSSFP · coronal · 6.0mm · 1.41mm/px · 1 of 25 slices shown (22 of 22)]
[im 1/25]
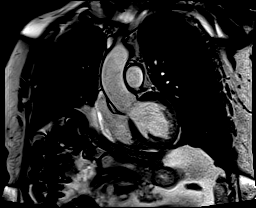

[Series 51: cine rvit · oblique · 6.0mm · 1.41mm/px · 1 of 25 slices shown]
[im 1/25]
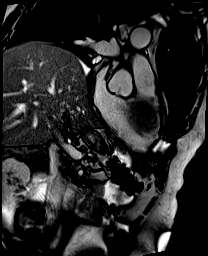

[Series 52: aortic valve cine · oblique · 6.0mm · 1.41mm/px · 1 of 25 slices shown]
[im 1/25]
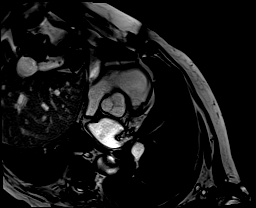

[Series 53: cine rvot · sagittal · 6.0mm · 1.41mm/px · 1 of 25 slices shown]
[im 1/25]
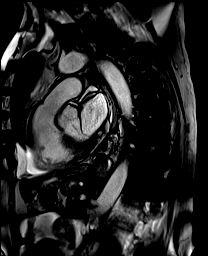

[45 of 48 positions shown; findings below may reference images not displayed]

FINDINGS: Limited images of the lung fields with no gross abnormalities.

Normal left ventricular size and wall thickness. Diffuse mild
hypokinesis with EF 44%. Normal right ventricular size with normal
systolic function, EF 46%. Normal left and right atrial sizes.
Lipomatous atrial septal hypertrophy noted. Trileaflet aortic valve,
no significant stenosis with mild regurgitation. No significant
mitral regurgitation noted.

On delayed enhancement imaging, there was a very small area of
apical inferior subendocardial late gadolinium enhancement (LGE)
noted (seen on short axis, not confirmed on long axis images).

Measurements:

LVEDV 137 mL

LVSV 60 mL
LVEF 44%

RVEDV 106 mL
RVSV 48 mL
RVEF 46%
IMPRESSION: 1. Normal LV size and wall thickness, diffuse mild hypokinesis with
EF 44%.

2.  Normal RV size and systolic function, EF 46%.

3. On delayed enhancement imaging, there was a very small area of
subendocardial apical inferior LGE, seen on short axis images but
not reproduced on long axis images. Significance is uncertain,?very
small area of infarction.

KECA

## 2020-08-02 MED ORDER — MIDAZOLAM HCL 2 MG/2ML IJ SOLN
0.5000 mg | Freq: Once | INTRAMUSCULAR | Status: AC
  Administered 2020-08-02: 0.5 mg via INTRAVENOUS
  Filled 2020-08-02: qty 0.5

## 2020-08-02 MED ORDER — GADOBUTROL 1 MMOL/ML IV SOLN
10.0000 mL | Freq: Once | INTRAVENOUS | Status: AC | PRN
  Administered 2020-08-02: 10 mL via INTRAVENOUS

## 2020-08-02 MED ORDER — MIDAZOLAM HCL 2 MG/2ML IJ SOLN
INTRAMUSCULAR | Status: AC
  Administered 2020-08-02: 0.5 mg
  Filled 2020-08-02: qty 2

## 2020-08-02 MED ORDER — MIDAZOLAM HCL 2 MG/2ML IJ SOLN
0.5000 mg | Freq: Once | INTRAMUSCULAR | Status: AC
  Filled 2020-08-02: qty 0.5

## 2020-08-03 ENCOUNTER — Encounter (INDEPENDENT_AMBULATORY_CARE_PROVIDER_SITE_OTHER): Payer: Medicare Other | Admitting: Cardiology

## 2020-08-03 DIAGNOSIS — G4733 Obstructive sleep apnea (adult) (pediatric): Secondary | ICD-10-CM

## 2020-08-05 ENCOUNTER — Encounter (HOSPITAL_COMMUNITY): Payer: Self-pay

## 2020-08-11 NOTE — Telephone Encounter (Signed)
MRI looks fine. We will reach out when sleep study results.

## 2020-08-22 ENCOUNTER — Ambulatory Visit: Payer: Medicare Other

## 2020-08-22 DIAGNOSIS — I5022 Chronic systolic (congestive) heart failure: Secondary | ICD-10-CM

## 2020-08-22 NOTE — Procedures (Signed)
   Sleep Study Report  Patient Information  Name: Steven Beard  ID: [id number] Birth Date: March 11, 1946  Age: 74  Gender: Male Study Date:08/03/2020 Referring Physician:  Glori Bickers, MD  TEST DESCRIPTION: Home sleep apnea testing was completed using the WatchPat, a Type 1 device, utilizing peripheral arterial tonometry (PAT), chest movement, actigraphy, pulse oximetry, pulse rate, body position and snore. AHI was calculated with apnea and hypopnea using valid sleep time as the denominator. RDI includes apneas, hypopneas, and RERAs. The data acquired and the scoring of sleep and all associated events were performed in accordance with the recommended standards and specifications as outlined in the AASM Manual for the Scoring of Sleep and Associated Events 2.2.0 (2015).  FINDINGS: 1. Severe Obstructive Sleep Apnea with AHI 47.3/hr.  2. Mild Central Sleep Apnea with pAHIc 6.7/hr. 3. Oxygen desaturations as low as 78%. 4. Severe snoring was present. O2 sats were < 88% for 15 min. 5. Total sleep time was 6 hrs and 58 min. 6. 17.1% of total sleep time was spent in REM sleep.  7. Shortened sleep onset latency at 6 min.  8. Prolonged REM sleep onset latency at 141 min.  9. Total awakenings were 18.   DIAGNOSIS:  Severe Obstructive Sleep Apnea (G47.33)  Recommendations  1. Clinical correlation of these findings is necessary. The decision to treat obstructive sleep apnea (OSA) is usually based on the presence of apnea symptoms or the presence of associated medical conditions such as Hypertension, Congestive Heart Failure, Atrial Fibrillation or Obesity. The most common symptoms of OSA are snoring, gasping for breath while sleeping, daytime sleepiness and fatigue.   2. Initiating apnea therapy is recommended given the presence of symptoms and/or associated conditions.   Recommend proceeding with one of the following:   a. Auto-CPAP therapy with a pressure range of 5-20cm H2O.   b.  An oral appliance (OA) that can be obtained from certain dentists with expertise in sleep medicine. These are primarily of use in non-obese patients with mild and moderate disease.   c. An ENT consultation which may be useful to look for specific causes of obstruction and possible treatment options.   d. If patient is intolerant to PAP therapy, consider referral to ENT for evaluation for hypoglossal nerve stimulator.   3. Close follow-up is necessary to ensure success with CPAP or oral appliance therapy for maximum benefit .  4. A follow-up oximetry study on CPAP is recommended to assess the adequacy of therapy and determine the need for supplemental oxygen or the potential need for Bi-level therapy. An arterial blood gas to determine the adequacy of baseline ventilation and oxygenation should also be considered.  5. Healthy sleep recommendations include: adequate nightly sleep (normal 7-9 hrs/night), avoidance of caffeine after noon and alcohol near bedtime, and maintaining a sleep environment that is cool, dark and quiet.  6. Weight loss for overweight patients is recommended. Even modest amounts of weight loss can significantly improve the severity of sleep apnea.  7. Snoring recommendations include: weight loss where appropriate, side sleeping, and avoidance of alcohol before bed.  8. Operation of motor vehicle or dangerous equipment must be avoided when feeling drowsy, excessively sleepy, or mentally fatigued.  Report prepared by: Signature: Fransico Him, MD [facility], Diplomat ABSM  Electronically Signed: Aug 22, 2020

## 2020-08-24 ENCOUNTER — Telehealth: Payer: Self-pay | Admitting: *Deleted

## 2020-08-24 DIAGNOSIS — G4733 Obstructive sleep apnea (adult) (pediatric): Secondary | ICD-10-CM

## 2020-08-24 NOTE — Telephone Encounter (Signed)
Informed patient of sleep study results and patient understanding was verbalized. Patient understands her sleep study showed they have sleep apnea and recommend auto CPAP titration through DME Orders have been placed in Epic. Please set 8 week OV with me.    Upon patient request DME selection is [facility]. Patient understands she/he will be contacted by [facility] to set up her/he cpap. Patient understands to call if [facility] does not contact her/he with new setup in a timely manner. Patient understands they will be called once confirmation has been received from [facility] that they have received their new machine to schedule 10 week follow up appointment.   [facility] notified of new cpap order  Please add to airview Patient was grateful for the call and thanked me.

## 2020-08-24 NOTE — Telephone Encounter (Signed)
-----   Message from Sueanne Margarita, MD sent at 08/22/2020  4:25 PM EST ----- Please let patient know that they have sleep apnea and recommend auto CPAP titration through DME  Orders have been placed in Epic. Please set 8 week OV with me.

## 2020-09-07 ENCOUNTER — Ambulatory Visit: Payer: Medicare Other | Admitting: Cardiovascular Disease

## 2020-09-09 ENCOUNTER — Encounter (HOSPITAL_COMMUNITY): Payer: Medicare Other | Admitting: Internal Medicine

## 2020-09-13 ENCOUNTER — Ambulatory Visit (HOSPITAL_COMMUNITY)
Admission: RE | Admit: 2020-09-13 | Discharge: 2020-09-13 | Disposition: A | Discharge status: Home / Self Care | Payer: Medicare Other | Source: Ambulatory Visit | Attending: Internal Medicine | Admitting: Internal Medicine

## 2020-09-13 ENCOUNTER — Other Ambulatory Visit: Payer: Self-pay

## 2020-09-13 ENCOUNTER — Encounter (HOSPITAL_COMMUNITY): Payer: Self-pay | Admitting: Internal Medicine

## 2020-09-13 VITALS — BP 132/88 | HR 96 | Wt 232.4 lb

## 2020-09-13 DIAGNOSIS — I428 Other cardiomyopathies: Secondary | ICD-10-CM | POA: Insufficient documentation

## 2020-09-13 DIAGNOSIS — I5022 Chronic systolic (congestive) heart failure: Secondary | ICD-10-CM

## 2020-09-13 DIAGNOSIS — I2510 Atherosclerotic heart disease of native coronary artery without angina pectoris: Secondary | ICD-10-CM | POA: Diagnosis not present

## 2020-09-13 DIAGNOSIS — Z79899 Other long term (current) drug therapy: Secondary | ICD-10-CM | POA: Insufficient documentation

## 2020-09-13 DIAGNOSIS — E119 Type 2 diabetes mellitus without complications: Secondary | ICD-10-CM | POA: Diagnosis not present

## 2020-09-13 DIAGNOSIS — I951 Orthostatic hypotension: Secondary | ICD-10-CM

## 2020-09-13 DIAGNOSIS — G4733 Obstructive sleep apnea (adult) (pediatric): Secondary | ICD-10-CM | POA: Diagnosis not present

## 2020-09-13 DIAGNOSIS — Z9884 Bariatric surgery status: Secondary | ICD-10-CM | POA: Insufficient documentation

## 2020-09-13 NOTE — Patient Instructions (Signed)
Please call our office in November 2022 to schedule your follow up appointment  If you have any questions or concerns before your next appointment please send [place] a message through [place] or call our office at [phone].    TO LEAVE A MESSAGE FOR THE NURSE SELECT OPTION 2, PLEASE LEAVE A MESSAGE INCLUDING: . YOUR NAME . DATE OF BIRTH . CALL BACK NUMBER . REASON FOR CALL**this is important as we prioritize the call backs  [place] AS LONG AS YOU CALL BEFORE 4:00 PM  At the [facility], you and your health needs are our priority. As part of our continuing mission to provide you with exceptional heart care, we have created designated Provider Care Teams. These Care Teams include your primary Cardiologist (physician) and Advanced Practice Providers (APPs- Physician Assistants and Nurse Practitioners) who all work together to provide you with the care you need, when you need it.   You may see any of the following providers on your designated Care Team at your next follow up: [name] Dr Glori Bickers . Dr Loralie Champagne . Darrick Grinder, NP . Lyda Jester, PA . Audry Riles, PharmD   Please be sure to bring in all your medications bottles to every appointment.

## 2020-09-13 NOTE — Progress Notes (Signed)
ADVANCED HF CLINIC NOTE  Referring Physician: Dr. Acie Fredrickson Primary Care: none Primary Cardiologist: Dr. Acie Fredrickson  HPI:  Steven Beard is a 74 y.o. male with morbid obesity s/p gastric bypass, DM2, h/o goiter s/p thyroid resection, systolic HF due to NICM referred by Dr. Acie Fredrickson for further evaluation of systolic HF.   Previously followed by Dr. Benetta Spar in [place] and moved to [place] and established with Dr. Acie Fredrickson in 2017.  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 First told he had a low EF in 2005 after gastric bypass. Was referred to Dr. Benetta Spar. Did not have a cath at that time. Moved to [place] in 2017.   SPEP negative   Here for f/u. Feeling better. More energy. Since we last saw him had cMRI with EF 44%. Minimal LGE. Sleep study with severe OSA (AHI 47/hr). Awaiting CPAP. At last visit started on midodrine 2.5 tid and SBP now 110-120. Going to PT 2x/week and doing well with that. Hasn't been able to get compression compression socks on. Breathing is fine. No orthopnea or PND.   Echo 4/21: EF 30-35% RV ok.  CT a/P 07/04/20: unremarkable  Cath 05/19/20: EF 25-35% Minimal nonobstructive CAD RA = 7 PA = 40/17 (26)  PCWP 15 CO/CI 7.8/3.4  cMRI 10/21: EF 44%, RV ok, very small area of subendocardial apical inferior LGE ? significance  Past Medical History:  Diagnosis Date  . Hidradenitis   . History of BPH    benign prostatic hypertrophy  . History of hydrocele    in right testicle   . Hypothyroidism   . NICM (nonischemic cardiomyopathy) ([place])   . S/P gastric bypass     Current Outpatient Medications  Medication Sig Dispense Refill  . acetaminophen (TYLENOL) 500 MG tablet Take 1,000 mg by mouth every 6 (six) hours as needed for moderate pain or headache.    . ALPRAZolam (XANAX) 1 MG tablet Take 0.5 mg by mouth at bedtime as needed for anxiety.     [name] buPROPion (WELLBUTRIN SR) 150 MG 12 hr tablet Take 150 mg by mouth 2 (two) times daily.     . carvedilol (COREG)  3.125 MG tablet TAKE 1 TABLET BY MOUTH  TWICE DAILY WITH A MEAL 180 tablet 3  . finasteride (PROSCAR) 5 MG tablet Take 5 mg by mouth daily.    [name] HYDROcodone-acetaminophen (NORCO/VICODIN) 5-325 MG tablet Take 1 tablet by mouth every 4 (four) hours as needed. 10 tablet 0  . levothyroxine (SYNTHROID, LEVOTHROID) 175 MCG tablet Take 175 mcg by mouth daily before breakfast. 6 days a week    . meloxicam (MOBIC) 7.5 MG tablet Take 7.5 mg by mouth daily as needed for pain.     . midodrine (PROAMATINE) 2.5 MG tablet Take 1 tablet (2.5 mg total) by mouth 3 (three) times daily with meals. 90 tablet 3  . Multiple Vitamins-Minerals (PRESERVISION AREDS) TABS Take 1 tablet by mouth 2 (two) times daily.     . naproxen sodium (ALEVE) 220 MG tablet Take 220 mg by mouth daily as needed (back pain).    . tamsulosin (FLOMAX) 0.4 MG CAPS capsule Take 0.4 mg by mouth daily.    . traZODone (DESYREL) 50 MG tablet Take 25 mg by mouth at bedtime.     [name] VITAMIN D PO Take 1 capsule by mouth daily.     No current facility-administered medications for this encounter.    Allergies  Allergen Reactions  . Minocycline Hives  . Codeine Itching  Social History   Socioeconomic History  . Marital status: Married    Spouse name: Not on file  . Number of children: Not on file  . Years of education: Not on file  . Highest education level: Not on file  Occupational History  . Not on file  Tobacco Use  . Smoking status: Never Smoker  . Smokeless tobacco: Never Used  Vaping Use  . Vaping Use: Never used  Substance and Sexual Activity  . Alcohol use: Yes    Alcohol/week: 1.0 standard drink    Types: 1 Glasses of wine per week    Comment: occ   . Drug use: Never  . Sexual activity: Not on file  Other Topics Concern  . Not on file  Social History Narrative  . Not on file   Social Determinants of Health   Financial Resource Strain:   . Difficulty of Paying Living Expenses: Not on file  Food Insecurity:   .  Worried About Charity fundraiser in the Last Year: Not on file  . Ran Out of Food in the Last Year: Not on file  Transportation Needs:   . Lack of Transportation (Medical): Not on file  . Lack of Transportation (Non-Medical): Not on file  Physical Activity:   . Days of Exercise per Week: Not on file  . Minutes of Exercise per Session: Not on file  Stress:   . Feeling of Stress : Not on file  Social Connections:   . Frequency of Communication with Friends and Family: Not on file  . Frequency of Social Gatherings with Friends and Family: Not on file  . Attends Religious Services: Not on file  . Active Member of Clubs or Organizations: Not on file  . Attends Archivist Meetings: Not on file  . Marital Status: Not on file  Intimate Partner Violence:   . Fear of Current or Ex-Partner: Not on file  . Emotionally Abused: Not on file  . Physically Abused: Not on file  . Sexually Abused: Not on file      Family History  Problem Relation Age of Onset  . Heart disease Mother        family hx; unsure what side  . Heart disease Father        family hx; unsure what side     Vitals:   09/13/20 1048  BP: 132/88  Pulse: 96  SpO2: 97%  Weight: 105.4 kg (232 lb 6.4 oz)   Wt Readings from Last 3 Encounters:  09/13/20 105.4 kg (232 lb 6.4 oz)  07/08/20 106.9 kg (235 lb 9.6 oz)  06/29/20 106.6 kg (235 lb)    Orthostatics done personally  Sitting 118/76 Standing 118/78  PHYSICAL EXAM: General:  Well appearing. No resp difficulty HEENT: normal Neck: supple. no JVD. Carotids 2+ bilat; no bruits. No lymphadenopathy or thryomegaly appreciated. Cor: PMI nondisplaced. Regular rate & rhythm. No rubs, gallops or murmurs. Lungs: clear Abdomen: obese soft, nontender, nondistended. No hepatosplenomegaly. No bruits or masses. Good bowel sounds. Extremities: no cyanosis, clubbing, rash, edema Neuro: alert & orientedx3, cranial nerves grossly intact. moves all 4 extremities w/o  difficulty. Affect pleasant   ASSESSMENT & PLAN:  1. Orthostatic hypotension with probable autonomic dysfunction - I suspect low BP may be a big component of his symptoms as he says he has felt better when BP meds were stopped  - Much improved with midodrine. Can increase dose as needed. Not orthostatic on exam - Will have him  use stocking donner to help with compression hose - cMRI 10/21: EF 44%, RV ok, very small area of subendocardial apical inferior LGE ? significance - no amyloid  - genetic testing to look for evidence of possible TTR cardiomyopathy was negative - MM panel negative, TSH and B12 unremarkable, no convincing evidence for adrenal insufficiency at this point  2. Chronic systolic HF due to NICM - Echo 4/21 EF 30-35% - Cath 8/21 EF 25-30% minimal CAD RHC well compensated - cMRI 10/21: EF 44%, RV ok, very small area of subendocardial apical inferior LGE ? significance  - Improved NYHA II suspect majority of symptoms related to orthostatic hypotension. Now much improved - volume status ok - GDMT titration limited by low bp. EF 44%. Continue to follow.  - Continue carvedilol  3. Severe OSA - Sleep Study on 10/21 AHA 47.3/hr - awaiting CPAP - followed by Dr. Radford Pax  4. DM2 - Per PCP - can eventually consider SGLT2i but avoid for now as fluid loss will worsen orthostasis   5. Morbid obesity s/p gastric bypass - malabsorption may be contributing to micronutrient deficiencies ann exacerbate orthostasis. - May need Nutrition to see  He will f/u with Dr. Acie Fredrickson. I will make return visit in 12 months. Can see me sooner, if needed.   Glori Bickers, MD  10:51 AM

## 2020-09-15 ENCOUNTER — Other Ambulatory Visit (HOSPITAL_COMMUNITY): Payer: Self-pay | Admitting: Internal Medicine

## 2020-09-22 NOTE — Addendum Note (Signed)
Addended by: Freada Bergeron on: 09/22/2020 04:33 PM   Modules accepted: Orders

## 2020-11-11 ENCOUNTER — Other Ambulatory Visit: Payer: Self-pay | Admitting: Gastroenterology

## 2020-11-11 DIAGNOSIS — R1011 Right upper quadrant pain: Secondary | ICD-10-CM

## 2020-11-11 DIAGNOSIS — R634 Abnormal weight loss: Secondary | ICD-10-CM

## 2020-11-16 ENCOUNTER — Other Ambulatory Visit: Payer: Self-pay | Admitting: Surgery

## 2020-11-18 ENCOUNTER — Other Ambulatory Visit (HOSPITAL_COMMUNITY): Payer: Self-pay | Admitting: Internal Medicine

## 2020-11-19 ENCOUNTER — Telehealth: Payer: Self-pay | Admitting: Cardiovascular Disease

## 2020-11-19 NOTE — Telephone Encounter (Signed)
   [facility] Pre-operative Risk Assessment    HEARTCARE STAFF: - Please ensure there is not already an duplicate clearance open for this procedure. - Under Visit Info/Reason for Call, type in Other and utilize the format Clearance MM/DD/YY or Clearance TBD. Do not use dashes or single digits. - If request is for dental extraction, please clarify the # of teeth to be extracted.  Request for surgical clearance:  1. What type of surgery is being performed? Excision of hidradenitis left inner thigh   2. When is this surgery scheduled? TBD   3. What type of clearance is required (medical clearance vs. Pharmacy clearance to hold med vs. Both)? Medical  4. Are there any medications that need to be held prior to surgery and how long? None listed   5. Practice name and name of physician performing surgery? [facility]- Dr. Ninfa Linden   6. What is the office phone number? [phone]   7.   What is the office fax number? [place], CMA  8.   Anesthesia type (None, local, MAC, general) ? General   _________________________________________________________________   (provider comments below)

## 2020-11-19 NOTE — Telephone Encounter (Signed)
   Primary Cardiologist: Mertie Moores, MD  Chart reviewed as part of pre-operative protocol coverage. Patient was contacted 11/19/2020 in reference to pre-operative risk assessment for pending surgery as outlined below.  Steven Beard was last seen on 09/13/20 by Dr.Bensimhon. H/o morbid obesity s/p GB, DM2, systolic CHF, NICM, severe OSA, midodrine use for orthostatic hypotension with probable autonomic dysfunction, minimal CAD by cath 05/2020. See his note for full details. Last EF 44% by cMRI 07/2020. At last OV he was felt to be doing well. I reached out to patient for update on how he is doing. The patient affirms he has been doing well without any new cardiac symptoms since that visit. He is working with PT without any recent anginal pain. He has stable mild DOE with higher levels of activity but is able to participate in PT and walk up steps without limitation. RCRI 0.9% indicating low risk of CV complications. Therefore, based on ACC/AHA guidelines, the patient would be at acceptable risk for the planned procedure without further cardiovascular testing.   The patient was advised that if he develops new symptoms prior to surgery to contact our office to arrange for a follow-up visit, and he verbalized understanding.  I will route this recommendation to the requesting party via Epic fax function and remove from pre-op pool. Please call with questions.  Charlie Pitter, PA-C 11/19/2020, 4:59 PM

## 2020-11-25 ENCOUNTER — Ambulatory Visit
Admission: RE | Admit: 2020-11-25 | Discharge: 2020-11-25 | Disposition: A | Discharge status: Home / Self Care | Payer: Medicare Other | Source: Ambulatory Visit | Attending: Gastroenterology | Admitting: Gastroenterology

## 2020-11-25 DIAGNOSIS — R1011 Right upper quadrant pain: Secondary | ICD-10-CM

## 2020-11-25 DIAGNOSIS — R634 Abnormal weight loss: Secondary | ICD-10-CM

## 2020-11-25 IMAGING — US US ABDOMEN COMPLETE
1 series · 16 of 25 positions shown · non-contrast
Comparison: CT [DATE]
COMPARISON: CT [DATE]

Addendum:
CLINICAL DATA: Ten years of intermittent right upper quadrant
abdominal pain, weight loss, history of gastric bypass

EXAM:
ABDOMEN ULTRASOUND COMPLETE

[Series 1: us abdomen complete · 0.26mm/px · 16 of 88 slices shown]
[im 1/88]
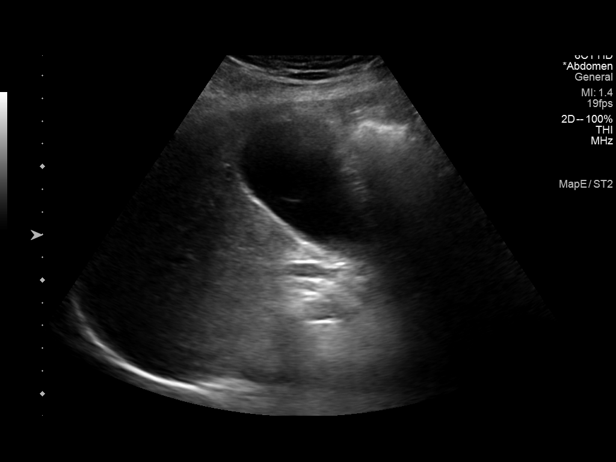
[im 8/88]
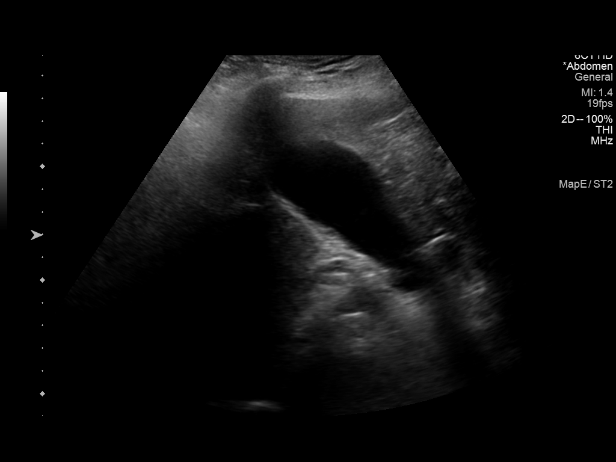
[im 11/88]
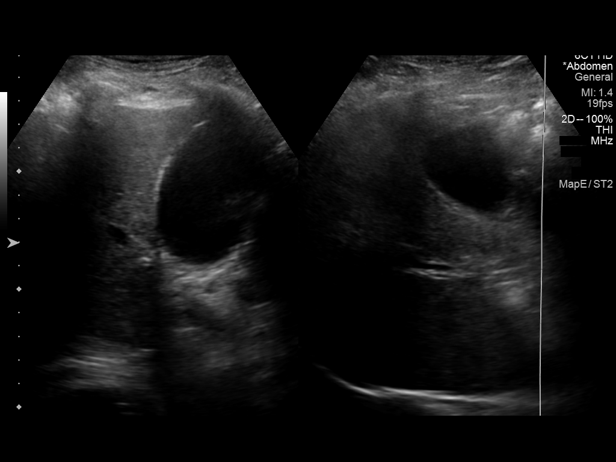
[im 19/88]
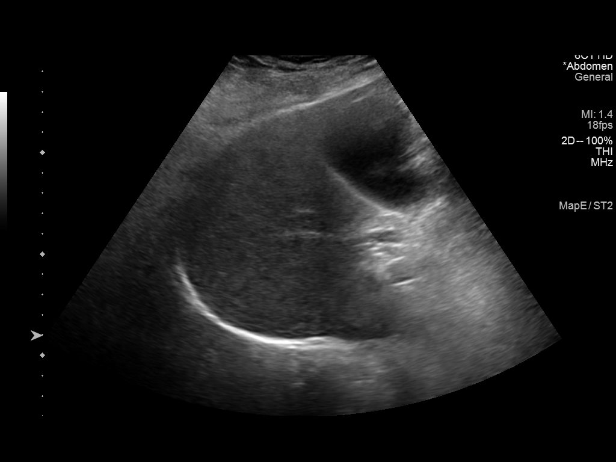
[im 26/88]
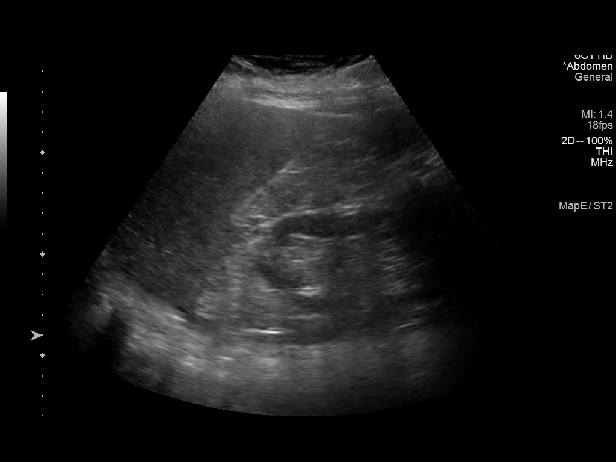
[im 30/88]
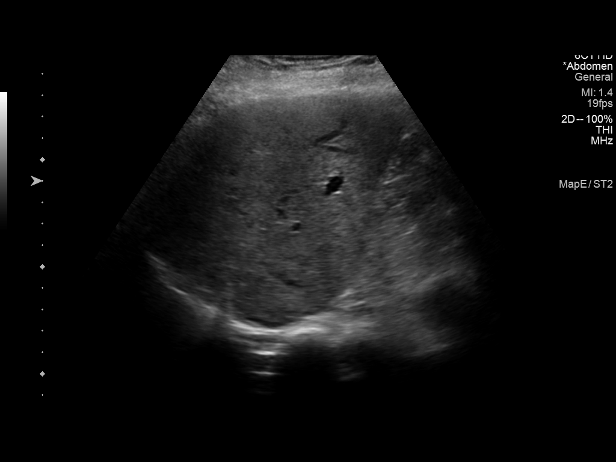
[im 37/88]
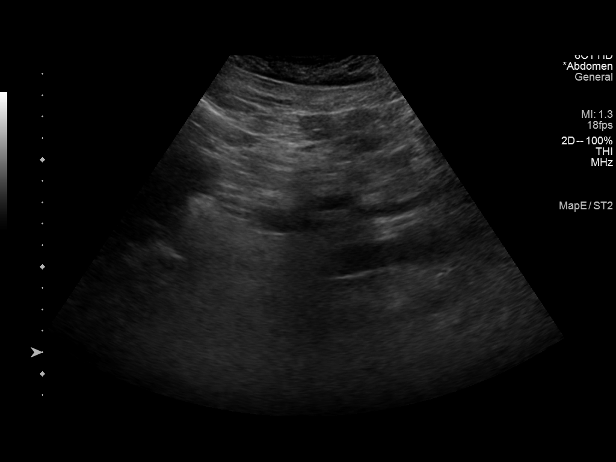
[im 40/88]
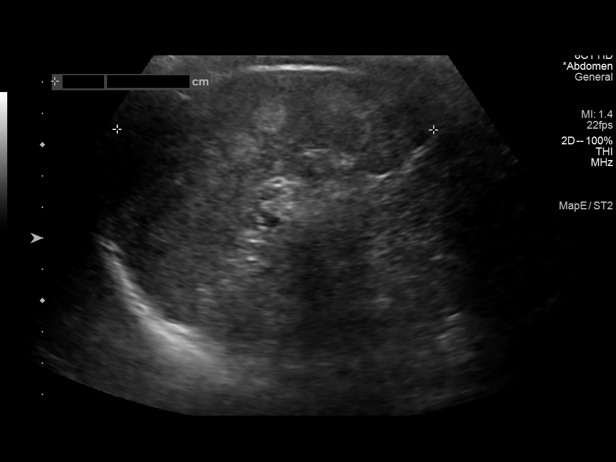
[im 48/88]
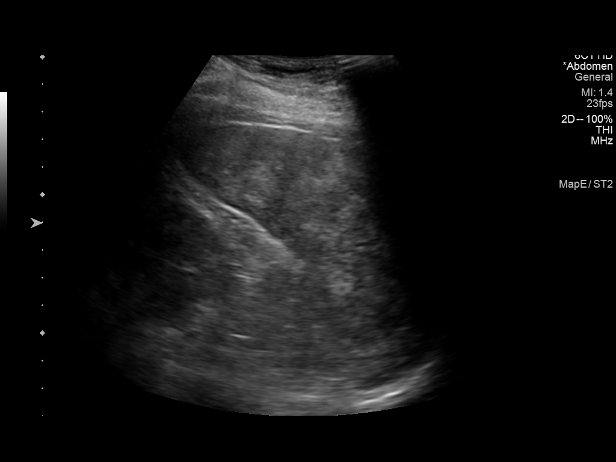
[im 51/88]
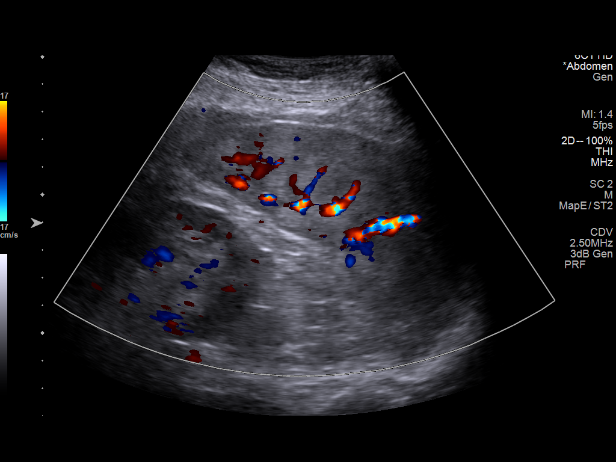
[im 59/88]
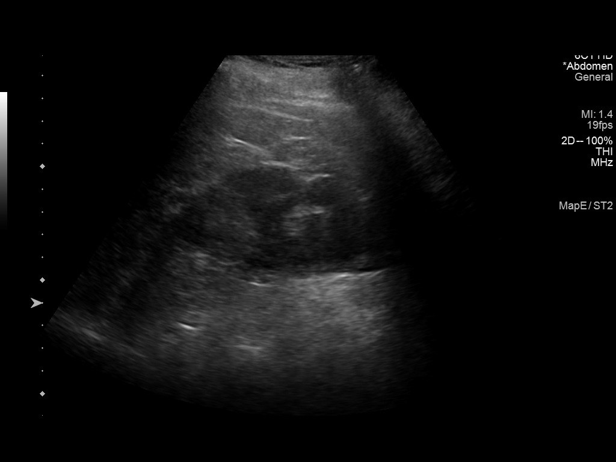
[im 62/88]
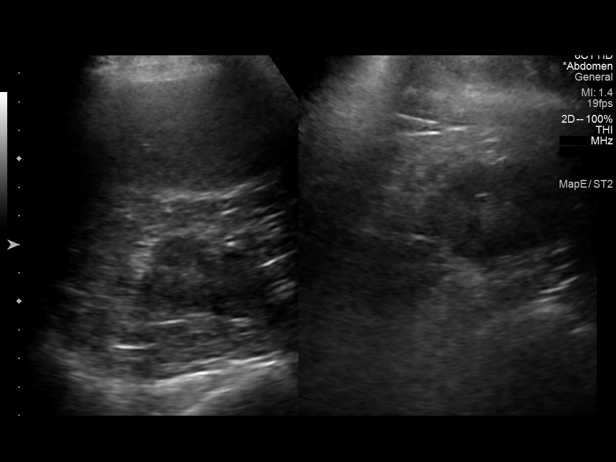
[im 69/88]
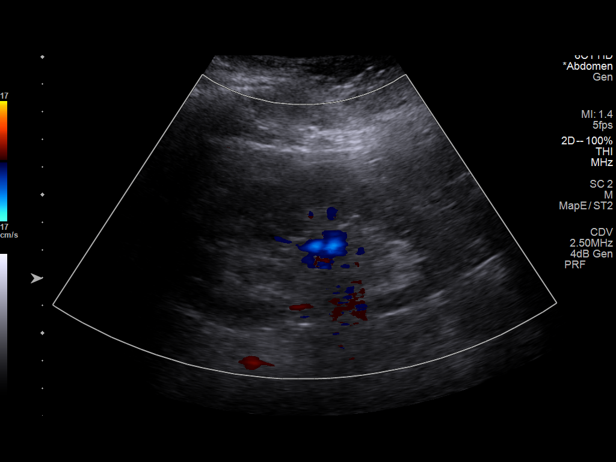
[im 77/88]
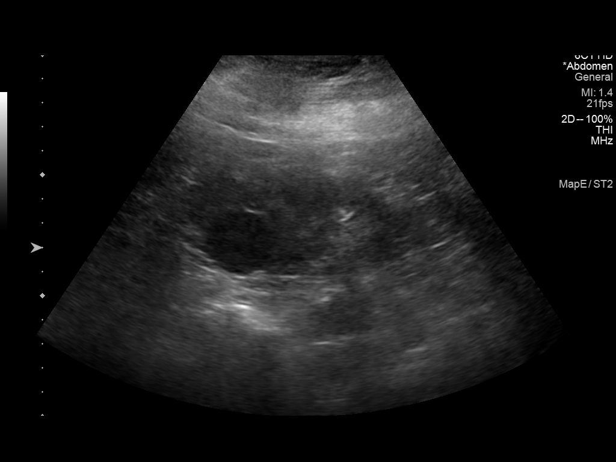
[im 80/88]
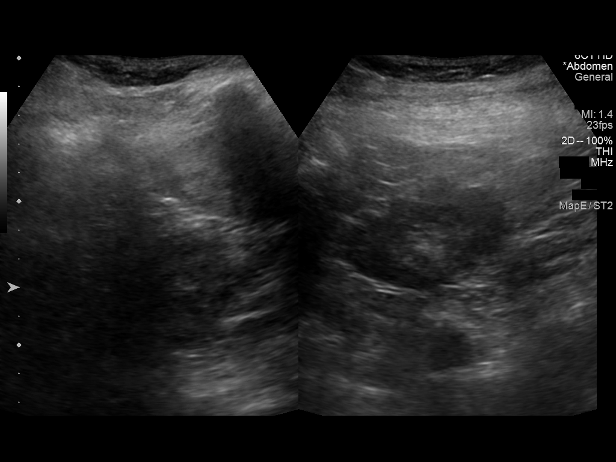
[im 88/88]
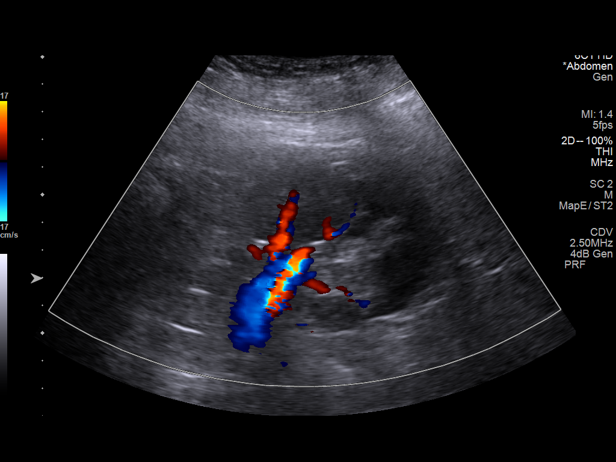

[16 of 25 positions shown; findings below may reference images not displayed]

FINDINGS: Gallbladder: No gallstones or wall thickening visualized. No
sonographic Murphy sign noted by sonographer.

Common bile duct: Diameter: 4 mm, within normal limits

Liver: No focal lesion identified. Within normal limits in
parenchymal echogenicity. Portal vein is patent on color Doppler
imaging with normal direction of blood flow towards the liver.

IVC: Poorly visualized due to bowel gas.

Pancreas: Poorly visualized due to bowel gas.

Spleen: Multiple ill-defined echogenic foci are present throughout
the spleen several of which have a targetoid appearance. Largest
foci measuring up to 2.5 x 2.2 x 2.7 cm in size.

Right Kidney: Length: 10.7 cm. Echogenicity is within normal limits.
No concerning renal mass, shadowing calculus or hydronephrosis.

Left Kidney: Length: 11.3 cm. Echogenicity is within normal limits.
3.4 x 2.6 x 2.3 cm cyst seen in the posterior upper pole left kidney
with normal posterior through transmission. No internal complexity
is seen. No concerning renal mass, shadowing calculus or
hydronephrosis.

Abdominal aorta: No aneurysm visualized.

Other findings: None.
IMPRESSION: 1. Technically challenging exam due to patient body habitus and
bowel gas.
2. Multiple ill-defined echogenic foci throughout the spleen several
of which have a targetoid appearance. Findings are nonspecific and
while this could reflect numerous splenic hemangioma, given
presenting symptoms a neoplastic process cannot be excluded.
Consider further evaluation with contrast enhanced MR imaging for
further characterization.
3. Simple appearing left renal cyst.
4. IVC and pancreas are poorly visualized due to bowel gas.

These results will be called to the ordering clinician or
representative by the Radiologist Assistant, and communication
documented in the PACS or [REDACTED].

ADDENDUM:
Most recent imaging comparison is from [DATE] this
includes CT imaging of the spleen which also demonstrates the
numerous hypoattenuating lesions throughout the splenic parenchyma.
While there are benign etiologies in the differential for multifocal
splenic lesions such as splenic siderosis or splenic PRSAD,
the differential remains sufficiently broad with infectious and
inflammatory etiologies including abscesses as well as lymphoma and
metastatic disease is. Furthermore, given a lack of significant time
from this comparison imaging and the patient's presenting symptoms
of unintended weight loss, recommend extreme caution in approaching
this diagnostic uncertainty and strongly recommend further
evaluation with contrast enhanced MR imaging of the spleen.

This addendum will be called to the ordering clinician or
representative by the Radiologist Assistant, and communication
documented in the PACS or [REDACTED].

*** End of Addendum ***
FINDINGS: Gallbladder: No gallstones or wall thickening visualized. No
sonographic Murphy sign noted by sonographer.

Common bile duct: Diameter: 4 mm, within normal limits

Liver: No focal lesion identified. Within normal limits in
parenchymal echogenicity. Portal vein is patent on color Doppler
imaging with normal direction of blood flow towards the liver.

IVC: Poorly visualized due to bowel gas.

Pancreas: Poorly visualized due to bowel gas.

Spleen: Multiple ill-defined echogenic foci are present throughout
the spleen several of which have a targetoid appearance. Largest
foci measuring up to 2.5 x 2.2 x 2.7 cm in size.

Right Kidney: Length: 10.7 cm. Echogenicity is within normal limits.
No concerning renal mass, shadowing calculus or hydronephrosis.

Left Kidney: Length: 11.3 cm. Echogenicity is within normal limits.
3.4 x 2.6 x 2.3 cm cyst seen in the posterior upper pole left kidney
with normal posterior through transmission. No internal complexity
is seen. No concerning renal mass, shadowing calculus or
hydronephrosis.

Abdominal aorta: No aneurysm visualized.

Other findings: None.
IMPRESSION: 1. Technically challenging exam due to patient body habitus and
bowel gas.
2. Multiple ill-defined echogenic foci throughout the spleen several
of which have a targetoid appearance. Findings are nonspecific and
while this could reflect numerous splenic hemangioma, given
presenting symptoms a neoplastic process cannot be excluded.
Consider further evaluation with contrast enhanced MR imaging for
further characterization.
3. Simple appearing left renal cyst.
4. IVC and pancreas are poorly visualized due to bowel gas.

These results will be called to the ordering clinician or
representative by the Radiologist Assistant, and communication
documented in the PACS or [REDACTED].

## 2020-11-30 ENCOUNTER — Other Ambulatory Visit: Payer: Self-pay | Admitting: Gastroenterology

## 2020-11-30 DIAGNOSIS — R935 Abnormal findings on diagnostic imaging of other abdominal regions, including retroperitoneum: Secondary | ICD-10-CM

## 2020-11-30 DIAGNOSIS — R634 Abnormal weight loss: Secondary | ICD-10-CM

## 2020-11-30 DIAGNOSIS — R1011 Right upper quadrant pain: Secondary | ICD-10-CM

## 2020-12-01 NOTE — Telephone Encounter (Signed)
Error

## 2020-12-03 ENCOUNTER — Ambulatory Visit
Admission: RE | Admit: 2020-12-03 | Discharge: 2020-12-03 | Disposition: A | Discharge status: Home / Self Care | Payer: Medicare Other | Source: Ambulatory Visit | Attending: Gastroenterology | Admitting: Gastroenterology

## 2020-12-03 DIAGNOSIS — R634 Abnormal weight loss: Secondary | ICD-10-CM

## 2020-12-03 DIAGNOSIS — R1011 Right upper quadrant pain: Secondary | ICD-10-CM

## 2020-12-03 DIAGNOSIS — R935 Abnormal findings on diagnostic imaging of other abdominal regions, including retroperitoneum: Secondary | ICD-10-CM

## 2020-12-03 IMAGING — MR MR ABDOMEN WO/W CM
12 of 17 series · 27 of 48 positions shown · IV contrast (multihance)
Comparison: CT on [DATE]

CLINICAL DATA: Right upper quadrant pain. Unintentional weight
loss. Follow-up indeterminate splenic lesions. Personal history of
melanoma and squamous cell carcinoma.

EXAM:
MRI ABDOMEN WITHOUT AND WITH CONTRAST
TECHNIQUE: Multiplanar multisequence MR imaging of the abdomen was performed
both before and after the administration of intravenous contrast.
CONTRAST:  20mL MULTIHANCE GADOBENATE DIMEGLUMINE 529 MG/ML IV SOLN

[Series 3: T2 · axial · 6.5mm · 0.74mm/px · 1 of 40 slices shown (1 of 3)]
[im 1/40]
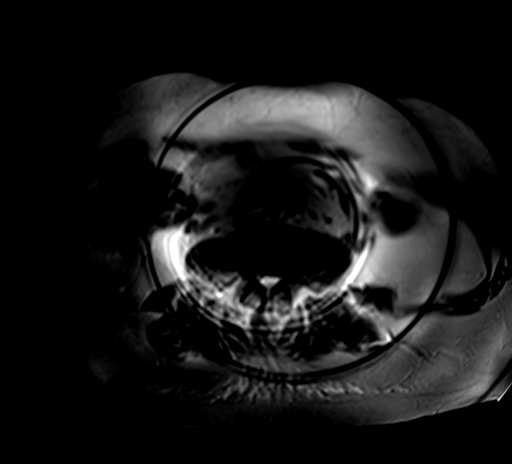

[Series 4: T2 · coronal · 5.0mm · 1.56mm/px · 1 of 43 slices shown (2 of 3)]
[im 1/43]
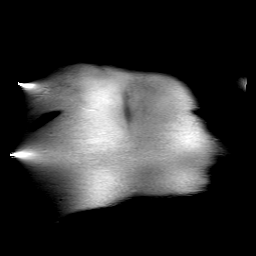

[Series 5: axial tru fisp · axial · 5.0mm · 1.56mm/px · 1 of 54 slices shown]
[im 1/54]
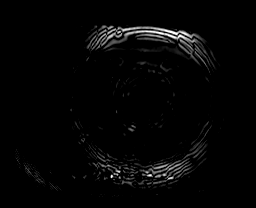

[Series 6: [id number]_500_[id number] · axial · 6.0mm · 2.08mm/px · z∈[-118,+178]mm · 2 of 117 slices shown]
[im 1/117]
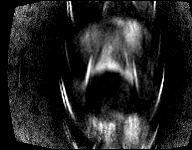
[im 117/117]
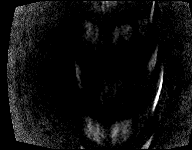

[Series 7: [id number]_adc · axial · 6.0mm · 2.08mm/px · 1 of 39 slices shown]
[im 1/39]
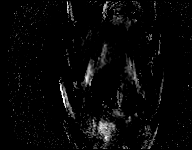

[Series 8: T2 · axial · 5.0mm · 1.56mm/px · 1 of 50 slices shown (3 of 3)]
[im 1/50]
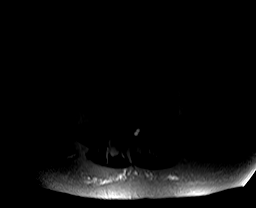

[Series 9: axial in out · axial · 6.0mm · 0.78mm/px · z∈[-165,+159]mm · 3 of 96 slices shown]
[im 1/96]
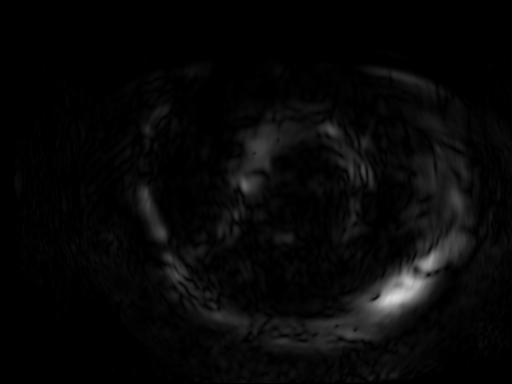
[im 48/96]
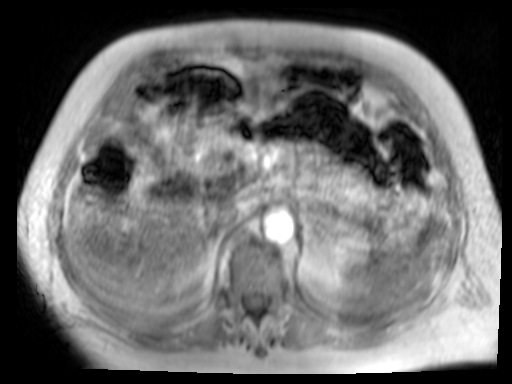
[im 96/96]
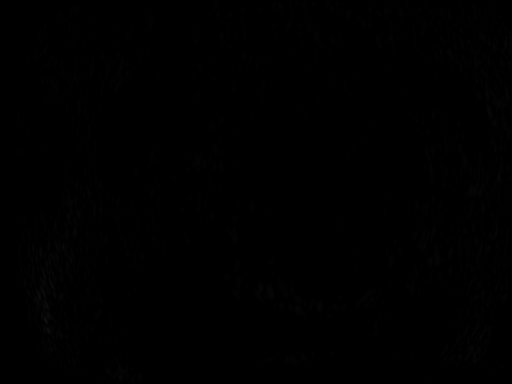

[Series 10: T1 dynamic · axial · non-contrast · 2.2mm · 0.78mm/px · z∈[-167,+147]mm · 4 of 144 slices shown]
[im 1/144]
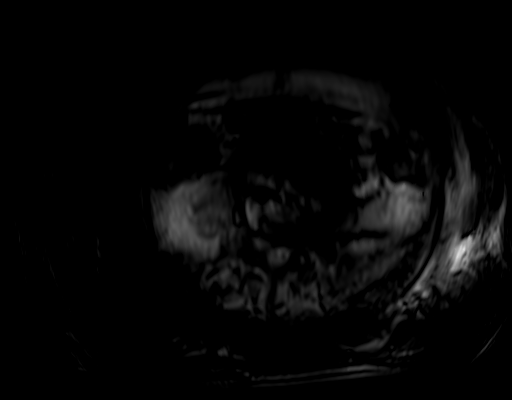
[im 48/144]
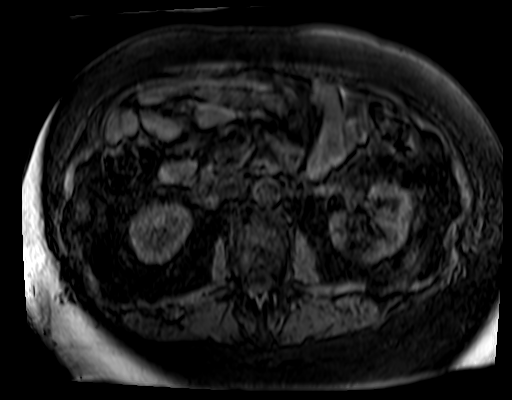
[im 96/144]
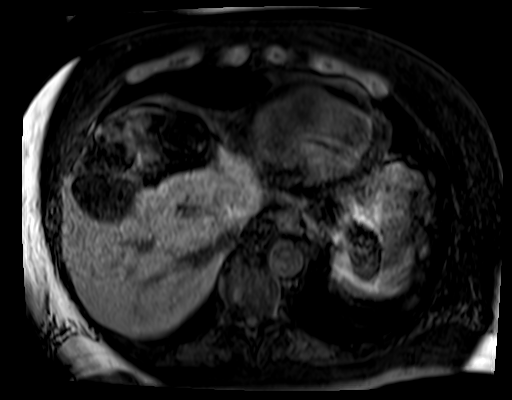
[im 144/144]
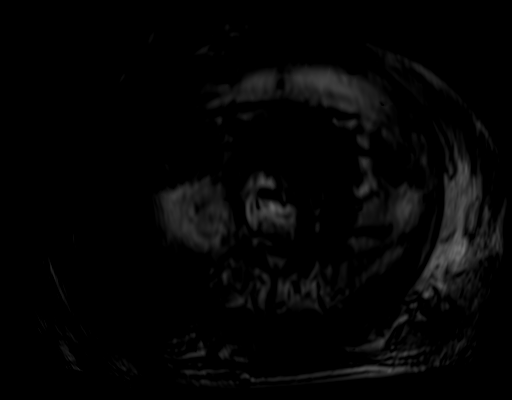

[Series 11: post 25 sec · axial · 2.2mm · 0.78mm/px · z∈[-167,+147]mm · 4 of 144 slices shown]
[im 1/144]
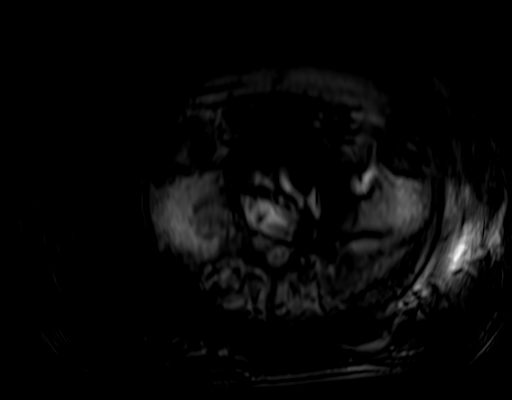
[im 48/144]
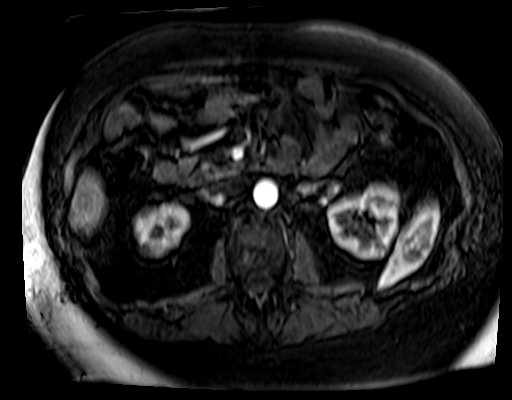
[im 96/144]
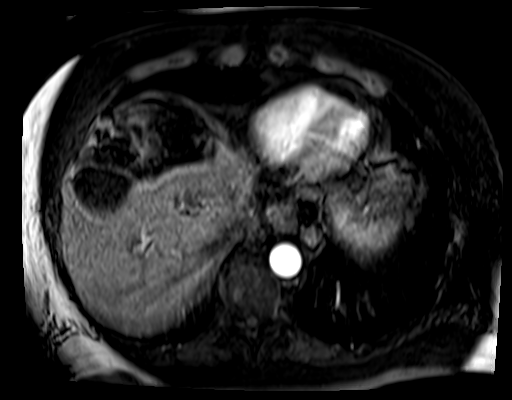
[im 144/144]
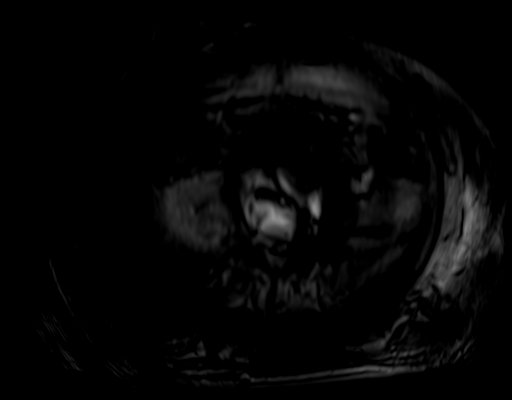

[Series 12: post 25 sec_sub · axial · 2.2mm · 0.78mm/px · z∈[-167,+147]mm · 4 of 144 slices shown]
[im 1/144]
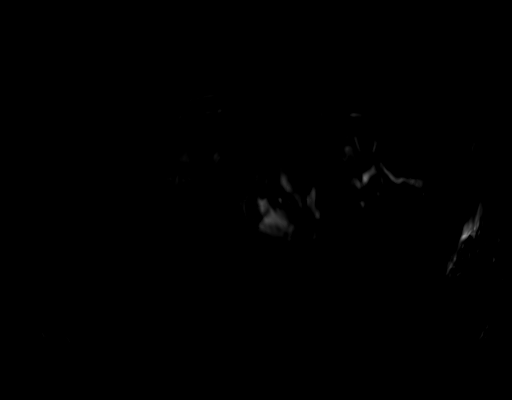
[im 48/144]
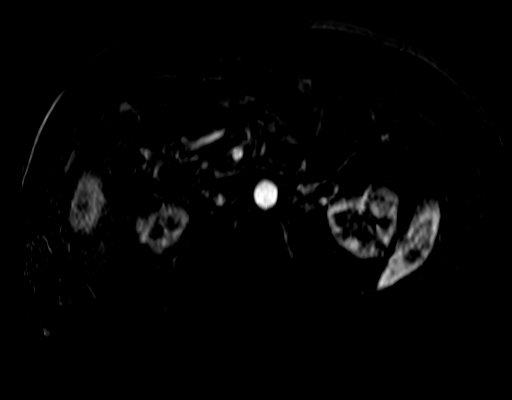
[im 96/144]
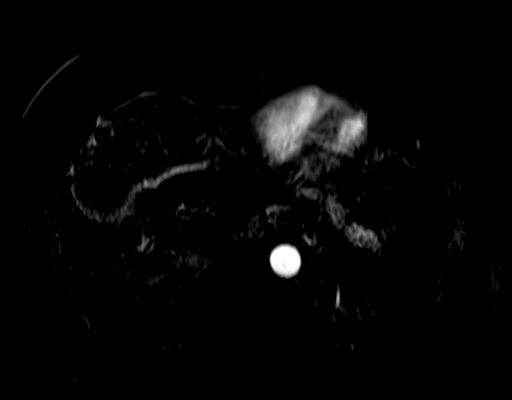
[im 144/144]
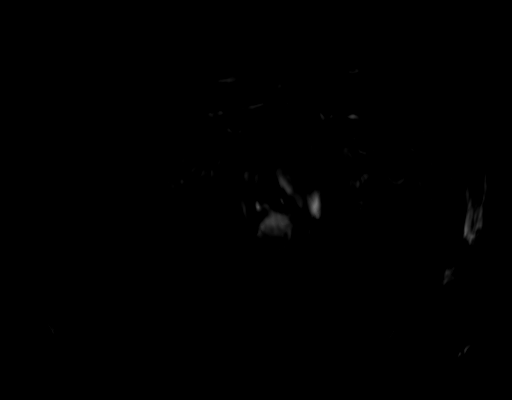

[Series 13: post 45 sec · axial · 2.2mm · 0.78mm/px · z∈[-167,+147]mm · 4 of 144 slices shown]
[im 1/144]
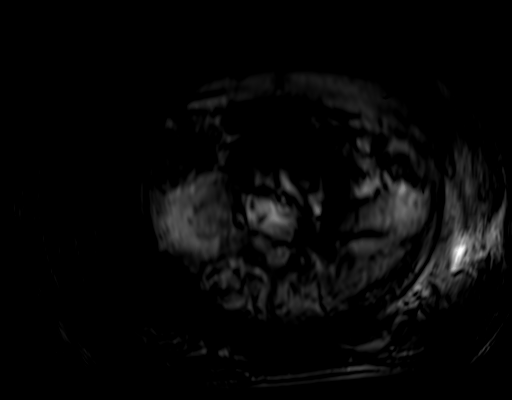
[im 48/144]
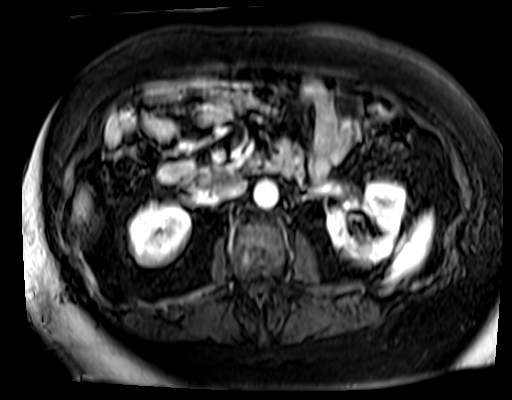
[im 96/144]
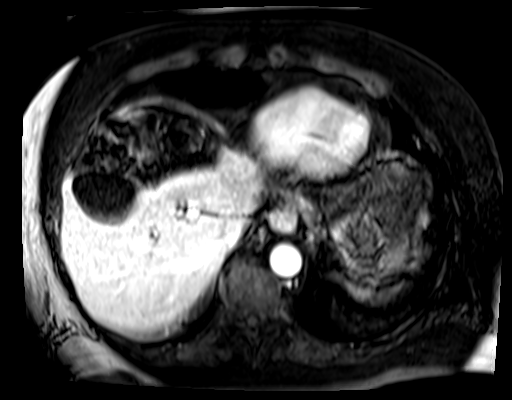
[im 144/144]
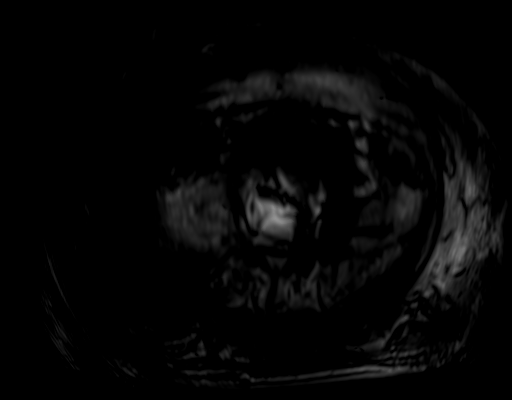

[Series 14: post 45 sec_sub · axial · 2.2mm · 0.78mm/px · 1 of 144 slices shown]
[im 1/144]
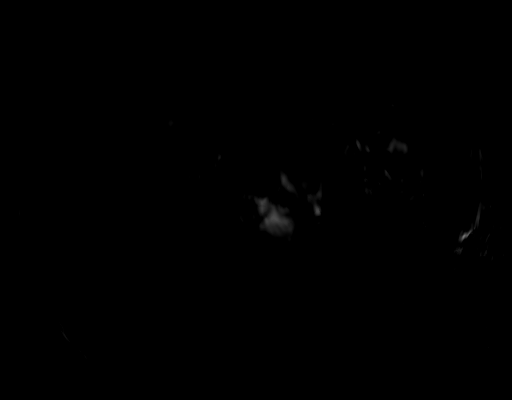

[27 of 48 positions shown; findings below may reference images not displayed]

FINDINGS: Image degradation by motion artifact noted.

Lower chest: No acute findings. Marked elevation of right
hemidiaphragm is noted.

Hepatobiliary: No hepatic masses identified. Gallbladder is
unremarkable. No evidence of biliary ductal dilatation.

Pancreas: Although degraded by motion artifact, there are multiple
tiny less than 5 mm cystic foci seen in the pancreatic head, neck,
and tail. No evidence of significant pancreatic ductal dilatation,
or peripancreatic inflammatory changes or fluid collections.

Spleen: No evidence of splenomegaly. Numerous small masses are seen
throughout the splenic parenchyma which show mild T2 hyperintensity
and mild hypovascular contrast enhancement. These are stable
compared to previous CT, and likely benign in etiology. These are
likely due to granulomatous disease or sarcoidosis.

Adrenals/Urinary Tract: No masses identified. Small left renal cyst
noted. No evidence of hydronephrosis.

Stomach/Bowel: Small hiatal hernia noted as well as previous gastric
bypass surgery. Otherwise unremarkable.

Vascular/Lymphatic: No pathologically enlarged lymph nodes
identified. No abdominal aortic aneurysm.

Other:  None.

Musculoskeletal:  No suspicious bone lesions identified.
IMPRESSION: Image degradation by motion artifact noted.

Numerous small hypovascular lesions throughout the splenic
parenchyma are stable compared to previous CT, and likely benign in
etiology. Recommend continued follow-up by MRI in 6 months. This
recommendation follows ACR consensus guidelines: White Paper of the
ACR Incidental Findings Committee II on Splenic and Nodal Findings.
[HOSPITAL] [7L];[DATE].

Multiple tiny less than 5 mm cystic foci in the pancreatic head,
neck, and tail, likely representing indolent cystic neoplasm such as
side-branch IPMNs. These can also be reassessed at time of follow-up
MRI.

Small hiatal hernia. Prior gastric bypass surgery.

## 2020-12-03 MED ORDER — GADOBENATE DIMEGLUMINE 529 MG/ML IV SOLN
20.0000 mL | Freq: Once | INTRAVENOUS | Status: AC | PRN
  Administered 2020-12-03: 20 mL via INTRAVENOUS

## 2020-12-08 ENCOUNTER — Other Ambulatory Visit: Payer: Self-pay

## 2020-12-08 ENCOUNTER — Encounter: Payer: Self-pay | Admitting: Cardiology

## 2020-12-08 ENCOUNTER — Ambulatory Visit (INDEPENDENT_AMBULATORY_CARE_PROVIDER_SITE_OTHER): Payer: Medicare Other | Admitting: Cardiology

## 2020-12-08 VITALS — BP 124/78 | HR 64 | Ht 71.0 in | Wt 241.0 lb

## 2020-12-08 DIAGNOSIS — G4733 Obstructive sleep apnea (adult) (pediatric): Secondary | ICD-10-CM

## 2020-12-08 DIAGNOSIS — E669 Obesity, unspecified: Secondary | ICD-10-CM | POA: Diagnosis not present

## 2020-12-08 HISTORY — DX: Obstructive sleep apnea (adult) (pediatric): G47.33

## 2020-12-08 NOTE — Progress Notes (Addendum)
Cardiology Office Note:    Date:  12/08/2020   ID:  Steven Beard, DOB 06-29-1946, MRN [id number]  PCP:  Pcp, No  Cardiologist:  Mertie Moores, MD    Referring MD: No ref. provider found   Chief Complaint  Patient presents with  . Sleep Apnea    History of Present Illness:    Steven Beard is a 75 y.o. male with a hx of NICM followed by Dr. Haroldine Laws, morbid obesity s/p gastric bypass, DM2 and chronic systolic CHF who is referred for evaluation for sleep apnea.  He apparently has been very sleepy with no energy and his wife mentioned to Dr. Haroldine Laws at his last OV the the patient snored a lot.  He did have OSA prior to gastric bypass and was on CPAP but have losing weight he stopped using his PAP and had not been checked since his surgery for residual apnea.  He had not been using a CPAP.    A home sleep study was ordered showing severe OSA with an AHI of 47.3/hr with mild central sleep apnea with pAHIc of 6.7/hr and nocturnal hypoxemia with a minimum O2 sat of 78% and < 88% for 15 minutes c/w nocturnal hypoxemia. He was started on auto CPAP and is now here for followup.    He is doing well with his auto CPAP device and thinks that he has gotten used to it.  He tolerates the nasal pillow mask and feels the pressure is adequate.  Since going on CPAP he has not really noticed a difference in how he feels during the day and still naps some. He denies any significant mouth or nasal dryness or nasal congestion.  He does not think that he snores unless he sleeps on his back.    Past Medical History:  Diagnosis Date  . Hidradenitis   . History of BPH    benign prostatic hypertrophy  . History of hydrocele    in right testicle   . Hypothyroidism   . NICM (nonischemic cardiomyopathy) ([place])   . OSA (obstructive sleep apnea) 12/08/2020   Severe OSA with an AHI of 47.3/hr with mild central sleep apnea with pAHIc of 6.7/hr and nocturnal hypoxemia with a minimum O2 sat of 78% and < 88% for 15  minutes c/w nocturnal hypoxemia.   . S/P gastric bypass     Past Surgical History:  Procedure Laterality Date  . CARDIOVASCULAR STRESS TEST     CARDIOLYTE ST LATE 2000's AT [facility] OFFICE REPORTEDLY NEGATIVE FOR ISCHEMIA  . ECHO DOPPLER COMPLETE(TRANSTHOR) ([facility] HX)     11/2010 HT ECHO W/COLORFLOW/SPECTRAL ([facility] HISTORICAL RESULT) LVEF 35%, GLOBAL HK, MILD MR/TR/AR, 2013 STABLE LVEF 35% GLOBAL HK, NO CHANGE C/W 2012, LVEF 40% WITH GLOBAL HK; TRIVIAL MR/AR, NO EFFUSION  . GASTRIC BYPASS     hx  . HYDROCELE EXCISION  2012   REPAIR  . RIGHT/LEFT HEART CATH AND CORONARY ANGIOGRAPHY N/A 05/19/2020   Procedure: RIGHT/LEFT HEART CATH AND CORONARY ANGIOGRAPHY;  Surgeon: Jettie Booze, MD;  Location: [place] [facility];  Service: Cardiovascular;  Laterality: N/A;    Current Medications: Current Meds  Medication Sig  . acetaminophen (TYLENOL) 500 MG tablet Take 1,000 mg by mouth every 6 (six) hours as needed for moderate pain or headache.  . ALPRAZolam (XANAX) 1 MG tablet Take 0.5 mg by mouth at bedtime as needed for anxiety.   [name] buPROPion (WELLBUTRIN SR) 150 MG 12 hr tablet Take 150 mg by mouth 2 (two) times daily.   [name]  carvedilol (COREG) 3.125 MG tablet TAKE 1 TABLET BY MOUTH  TWICE DAILY WITH A MEAL  . finasteride (PROSCAR) 5 MG tablet Take 5 mg by mouth daily.  [name] HYDROcodone-acetaminophen (NORCO/VICODIN) 5-325 MG tablet Take 1 tablet by mouth every 4 (four) hours as needed.  [name] levothyroxine (SYNTHROID, LEVOTHROID) 175 MCG tablet Take 175 mcg by mouth daily before breakfast. 6 days a week  . meloxicam (MOBIC) 7.5 MG tablet Take 7.5 mg by mouth daily as needed for pain.   . midodrine (PROAMATINE) 2.5 MG tablet TAKE 1 TABLET (2.5 MG TOTAL) BY MOUTH 3 (THREE) TIMES DAILY WITH MEALS.  . Multiple Vitamins-Minerals (PRESERVISION AREDS) TABS Take 1 tablet by mouth 2 (two) times daily.   . naproxen sodium (ALEVE) 220 MG tablet Take 220 mg by mouth daily as needed (back pain).  . tamsulosin (FLOMAX)  0.4 MG CAPS capsule Take 0.4 mg by mouth daily.  . traZODone (DESYREL) 50 MG tablet Take 25 mg by mouth at bedtime.   [name] VITAMIN D PO Take 1 capsule by mouth daily.     Allergies:   Minocycline and Codeine   Social History   Socioeconomic History  . Marital status: Married    Spouse name: Not on file  . Number of children: Not on file  . Years of education: Not on file  . Highest education level: Not on file  Occupational History  . Not on file  Tobacco Use  . Smoking status: Never Smoker  . Smokeless tobacco: Never Used  Vaping Use  . Vaping Use: Never used  Substance and Sexual Activity  . Alcohol use: Yes    Alcohol/week: 1.0 standard drink    Types: 1 Glasses of wine per week    Comment: occ   . Drug use: Never  . Sexual activity: Not on file  Other Topics Concern  . Not on file  Social History Narrative  . Not on file   Social Determinants of Health   Financial Resource Strain: Not on file  Food Insecurity: Not on file  Transportation Needs: Not on file  Physical Activity: Not on file  Stress: Not on file  Social Connections: Not on file     Family History: The patient's family history includes Heart disease in his father and mother.  ROS:   Please see the history of present illness.    ROS  All other systems reviewed and negative.   EKGs/Labs/Other Studies Reviewed:    The following studies were reviewed today: Home sleep study and PAP compliance download  EKG:  EKG is not ordered today.    Recent Labs: 07/04/2020: ALT 21; BUN 30; Creatinine, Ser 1.21; Hemoglobin 12.5; Platelets 394; Potassium 5.3; Sodium 141 07/08/2020: TSH 0.358   Recent Lipid Panel No results found for: CHOL, TRIG, HDL, CHOLHDL, VLDL, LDLCALC, LDLDIRECT   Physical Exam:    VS:  BP 124/78   Pulse 64   Ht 5\' 11"  (1.803 m)   Wt 241 lb (109.3 kg)   SpO2 96%   BMI 33.61 kg/m     Wt Readings from Last 3 Encounters:  12/08/20 241 lb (109.3 kg)  09/13/20 232 lb 6.4 oz  (105.4 kg)  07/08/20 235 lb 9.6 oz (106.9 kg)     GEN:  Well nourished, well developed in no acute distress HEENT: Normal NECK: No JVD; No carotid bruits LYMPHATICS: No lymphadenopathy CARDIAC: RRR, no murmurs, rubs, gallops RESPIRATORY:  Clear to auscultation without rales, wheezing or rhonchi  ABDOMEN: Soft, non-tender,  non-distended MUSCULOSKELETAL:  No edema; No deformity  SKIN: Warm and dry NEUROLOGIC:  Alert and oriented x 3 PSYCHIATRIC:  Normal affect   ASSESSMENT:    1. OSA (obstructive sleep apnea)   2. Obesity (BMI 30-39.9)    PLAN:    In order of problems listed above:  1. OSA - The patient is tolerating PAP therapy well without any problems. The PAP download was reviewed today and showed an AHI of 4/hr on auto PAP from 4-20 cm H2O with 61% compliance in using more than 4 hours nightly.  The patient has been using and benefiting from PAP use and will continue to benefit from therapy.  -encouraged him to be more compliant with his device  2.  Obesity -I have encouraged him to get into a routine exercise program and cut back on carbs and portions.    Medication Adjustments/Labs and Tests Ordered: Current medicines are reviewed at length with the patient today.  Concerns regarding medicines are outlined above.  No orders of the defined types were placed in this encounter.  No orders of the defined types were placed in this encounter.   Signed, Fransico Him, MD  12/08/2020 1:31 PM    [place]

## 2020-12-08 NOTE — Patient Instructions (Signed)

## 2020-12-16 ENCOUNTER — Encounter: Payer: Self-pay | Admitting: Cardiovascular Disease

## 2020-12-16 NOTE — Progress Notes (Signed)
Cardiology Office Note:    Date:  12/17/2020   ID:  Steven Beard, DOB Jan 09, 1946, MRN [id number]  PCP:  Pcp, No  Cardiologist:  Aruna Nestler  Electrophysiologist:  None   Referring MD: No ref. provider found   Chief Complaint  Patient presents with  . Congestive Heart Failure    Previous notes:    Steven Beard is a 75 y.o. male with a hx of nonischemic cardiomyopathy.  His ejection fraction is around 35% by echocardiogram in 2019.  He recently moved from [place] and is transferring cardiology care. We were asked to see him today for follow up of his CHF by Dr. Benetta Spar.   Also has borderline DM.   HbA1C is around 7 .   Able to do his normal activities.  He is not very active Does not exercise much at all .   Lets are very weak .  Has difficulty getting out of a chair without using his arms.   Has trouble when he stands for a while - gets weak, nauseated.   November 18, 2019:  Steven Beard is seen today for follow-up of his congestive heart failure.  He is quite weak.  His blood pressure is very low today. We stopped his Lisinopril and started Entresto at his last visit.  Takes his BP on occasion Typical 110-120 / 60-70  January 28, 2020:  Steven Beard is seen today for follow-up of his congestive heart failure.  He was seen several months ago for a follow-up after initiating Entresto.  His blood pressure was too low to uptitrate any of his medications at that time. Echo is unchanged from previous EF Is waking more , legs are getting stronger   We discussed CHF clinic We discussed possible ICD placement   May 11, 2020: Steven Beard is seen today for follow-up of his congestive heart failure.  He has been on Entresto but despite that his ejection fraction has not changed.  We referred him to the advanced heart failure clinic.  His appointment is scheduled for September 30.  He is still not feeling well  - weakness and fatigue.   No CP ,  Chronic DOE especially waking up [place] myoview in May 2021  shows no ischemia or infarction   Low risk  lprimary MD is [name] in [place].   Sept. 21, 2021: Seen with wife , Steven Beard is seen today for follow-up of his nonischemic cardiomyopathy.  He has an ejection fraction of 25 to 30%.  We have tried him on Entresto but this did not improve his LV function.  Recently, he has had profound hypotension and the Delene Loll has been on hold.  Spironolactone is also on hold   Cardiac cath Aug. 11, 2021 showed minimal, nonobstructive coronary artery disease.  His LVEF was 25 to 35%.  LV end-diastolic pressure was normal.  His PA pressure was 40/17 with a mean PA pressure of 26.  Pulmonary capillary wedge pressure was 15.  Cardiac output is 7.8 L/min with a cardiac index of 3.4 L/min.  His primary MD Ian Bushman, MD) ( phone [phone] ([phone])   wants to do an abdominal CT .  We will need to order  CRP and ESR are elevated, no night sweats.  Has lost lots of weight ( 20 lbs in 7 months ) , is not eating , has no appetite.  Lies in bed and sleeps a lot .  Is trying an antidepressant    December 17, 2020: Steven Beard is seen  for follow up of his CHF symptoms  He has been seen I CHF clinic  EF has improved to 44%. He was found to have orthostatic hypotension with probable autonomic dysfunction.  Improved with midodrine    cMRI 10/21: EF 44%, RV ok, very small area of subendocardial apical inferior LGE ? significance - no amyloid  - genetic testing to look for evidence of possible TTR cardiomyopathy was negative - MM panel negative, TSH and B12 unremarkable, no convincing evidence for adrenal insufficiency at this point Has just finished up with physical therapy. He has issues with orthostasis , is on midodrine    Past Medical History:  Diagnosis Date  . Hidradenitis   . History of BPH    benign prostatic hypertrophy  . History of hydrocele    in right testicle   . Hypothyroidism   . NICM (nonischemic cardiomyopathy) ([place])   . OSA  (obstructive sleep apnea) 12/08/2020   Severe OSA with an AHI of 47.3/hr with mild central sleep apnea with pAHIc of 6.7/hr and nocturnal hypoxemia with a minimum O2 sat of 78% and < 88% for 15 minutes c/w nocturnal hypoxemia.   . S/P gastric bypass     Past Surgical History:  Procedure Laterality Date  . CARDIOVASCULAR STRESS TEST     CARDIOLYTE ST LATE 2000's AT [facility] OFFICE REPORTEDLY NEGATIVE FOR ISCHEMIA  . ECHO DOPPLER COMPLETE(TRANSTHOR) ([facility] HX)     11/2010 HT ECHO W/COLORFLOW/SPECTRAL ([facility] HISTORICAL RESULT) LVEF 35%, GLOBAL HK, MILD MR/TR/AR, 2013 STABLE LVEF 35% GLOBAL HK, NO CHANGE C/W 2012, LVEF 40% WITH GLOBAL HK; TRIVIAL MR/AR, NO EFFUSION  . GASTRIC BYPASS     hx  . HYDROCELE EXCISION  2012   REPAIR  . RIGHT/LEFT HEART CATH AND CORONARY ANGIOGRAPHY N/A 05/19/2020   Procedure: RIGHT/LEFT HEART CATH AND CORONARY ANGIOGRAPHY;  Surgeon: Jettie Booze, MD;  Location: [facility];  Service: Cardiovascular;  Laterality: N/A;    Current Medications: Current Meds  Medication Sig  . acetaminophen (TYLENOL) 500 MG tablet Take 1,000 mg by mouth every 6 (six) hours as needed for moderate pain or headache.  . ALPRAZolam (XANAX) 1 MG tablet Take 0.5 mg by mouth at bedtime as needed for anxiety.   [name] buPROPion (WELLBUTRIN SR) 150 MG 12 hr tablet Take 150 mg by mouth 2 (two) times daily.   . carvedilol (COREG) 3.125 MG tablet TAKE 1 TABLET BY MOUTH  TWICE DAILY WITH A MEAL  . finasteride (PROSCAR) 5 MG tablet Take 5 mg by mouth daily.  [name] HYDROcodone-acetaminophen (NORCO/VICODIN) 5-325 MG tablet Take 1 tablet by mouth every 4 (four) hours as needed.  [name] levothyroxine (SYNTHROID, LEVOTHROID) 175 MCG tablet Take 175 mcg by mouth daily before breakfast. 6 days a week  . meloxicam (MOBIC) 7.5 MG tablet Take 7.5 mg by mouth daily as needed for pain.   . Multiple Vitamins-Minerals (PRESERVISION AREDS) TABS Take 1 tablet by mouth 2 (two) times daily.   . naproxen sodium (ALEVE) 220 MG  tablet Take 220 mg by mouth daily as needed (back pain).  . tamsulosin (FLOMAX) 0.4 MG CAPS capsule Take 0.4 mg by mouth daily.  . traZODone (DESYREL) 50 MG tablet Take 25 mg by mouth at bedtime.   [name] VITAMIN D PO Take 1 capsule by mouth daily.  . [DISCONTINUED] midodrine (PROAMATINE) 2.5 MG tablet TAKE 1 TABLET (2.5 MG TOTAL) BY MOUTH 3 (THREE) TIMES DAILY WITH MEALS.     Allergies:   Minocycline and Codeine  Social History   Socioeconomic History  . Marital status: Married    Spouse name: Not on file  . Number of children: Not on file  . Years of education: Not on file  . Highest education level: Not on file  Occupational History  . Not on file  Tobacco Use  . Smoking status: Never Smoker  . Smokeless tobacco: Never Used  Vaping Use  . Vaping Use: Never used  Substance and Sexual Activity  . Alcohol use: Yes    Alcohol/week: 1.0 standard drink    Types: 1 Glasses of wine per week    Comment: occ   . Drug use: Never  . Sexual activity: Not on file  Other Topics Concern  . Not on file  Social History Narrative  . Not on file   Social Determinants of Health   Financial Resource Strain: Not on file  Food Insecurity: Not on file  Transportation Needs: Not on file  Physical Activity: Not on file  Stress: Not on file  Social Connections: Not on file     Family History: The patient's family history includes Heart disease in his father and mother.  ROS:   Please see the history of present illness.     All other systems reviewed and are negative.  EKGs/Labs/Other Studies Reviewed:    The following studies were reviewed today:     Recent Labs: 07/04/2020: ALT 21; BUN 30; Creatinine, Ser 1.21; Hemoglobin 12.5; Platelets 394; Potassium 5.3; Sodium 141 07/08/2020: TSH 0.358  Recent Lipid Panel No results found for: CHOL, TRIG, HDL, CHOLHDL, VLDL, LDLCALC, LDLDIRECT   Physical Exam: Blood pressure 102/82, pulse 83, height _0  (1.803 m), weight 242 lb (109.8  kg), SpO2 97 %.  GEN:  Well nourished, well developed in no acute distress HEENT: Normal NECK: No JVD; No carotid bruits LYMPHATICS: No lymphadenopathy CARDIAC: RRR , no murmurs, rubs, gallops RESPIRATORY:  Clear to auscultation without rales, wheezing or rhonchi  ABDOMEN: Soft, non-tender, non-distended MUSCULOSKELETAL:  No edema; No deformity  SKIN: Warm and dry NEUROLOGIC:  Alert and oriented x 3    EKG:   ASSESSMENT:    1. Chronic systolic CHF (congestive heart failure) ([place])   2. OSA (obstructive sleep apnea)   3. Physical deconditioning    PLAN:       1.  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  His heart catheterization revealed minor, nonobstructive coronary lesions.  His ejection fraction was 25 to 35%. His EF has improved.  Is not having any CHF symptoms at present      2.  Weight loss/fatigue: He has been seen by his primary medical doctor.    3.  Elevated ESR and CRP:    Further management per his medical doctor    4. Anemia :managed by his primary   5.  OSA : has been seen by Dr. Radford Pax    Medication Adjustments/Labs and Tests Ordered: Current medicines are reviewed at length with the patient today.  Concerns regarding medicines are outlined above.  Orders Placed This Encounter  Procedures  . Amb Referral To Provider Referral Exercise Program (P.R.E.P)   Meds ordered this encounter  Medications  . midodrine (PROAMATINE) 2.5 MG tablet    Sig: Take 1 tablet (2.5 mg total) by mouth 3 (three) times daily with meals.    Dispense:  270 tablet    Refill:  3    PATIENT NEEDS MOR  REFILLS      Patient Instructions  Medication Instructions:  Your physician recommends that you continue on your current medications as directed. Please refer to the Current Medication list given to you today.  *If you need a refill on your cardiac medications before your next appointment, please call your  pharmacy*   Lab Work: none If you have labs (blood work) drawn today and your tests are completely normal, you will receive your results only by: [name] MyChart Message (if you have MyChart) OR . A paper copy in the mail If you have any lab test that is abnormal or we need to change your treatment, we will call you to review the results.   Testing/Procedures: none   Follow-Up: At [facility], you and your health needs are our priority.  As part of our continuing mission to provide you with exceptional heart care, we have created designated Provider Care Teams.  These Care Teams include your primary Cardiologist (physician) and Advanced Practice Providers (APPs -  Physician Assistants and Nurse Practitioners) who all work together to provide you with the care you need, when you need it.  We recommend signing up for the patient portal called "MyChart".  Sign up information is provided on this After Visit Summary.  MyChart is used to connect with patients for Virtual Visits (Telemedicine).  Patients are able to view lab/test results, encounter notes, upcoming appointments, etc.  Non-urgent messages can be sent to your provider as well.   To learn more about what you can do with MyChart, go to [email or web address].    Your next appointment:   18 month(s)  The format for your next appointment:   In Person  Provider:   You may see Mertie Moores, MD or one of the following Advanced Practice Providers on your designated Care Team:    Richardson Dopp, PA-C  Robbie Lis, [place]    Other Instructions Provider Referral Exercise Program     Signed, Mertie Moores, MD  12/17/2020 5:11 PM    [place]

## 2020-12-17 ENCOUNTER — Other Ambulatory Visit: Payer: Self-pay

## 2020-12-17 ENCOUNTER — Encounter: Payer: Self-pay | Admitting: Cardiovascular Disease

## 2020-12-17 ENCOUNTER — Ambulatory Visit (INDEPENDENT_AMBULATORY_CARE_PROVIDER_SITE_OTHER): Payer: Medicare Other | Admitting: Cardiovascular Disease

## 2020-12-17 VITALS — BP 102/82 | HR 83 | Ht 71.0 in | Wt 242.0 lb

## 2020-12-17 DIAGNOSIS — I5022 Chronic systolic (congestive) heart failure: Secondary | ICD-10-CM

## 2020-12-17 DIAGNOSIS — G4733 Obstructive sleep apnea (adult) (pediatric): Secondary | ICD-10-CM | POA: Diagnosis not present

## 2020-12-17 DIAGNOSIS — R5381 Other malaise: Secondary | ICD-10-CM | POA: Insufficient documentation

## 2020-12-17 MED ORDER — MIDODRINE HCL 2.5 MG PO TABS: 2.5000 mg | ORAL_TABLET | Freq: Three times a day (TID) | ORAL | 3 refills | Status: DC

## 2020-12-17 NOTE — Patient Instructions (Signed)
Medication Instructions:  Your physician recommends that you continue on your current medications as directed. Please refer to the Current Medication list given to you today.  *If you need a refill on your cardiac medications before your next appointment, please call your pharmacy*   Lab Work: none If you have labs (blood work) drawn today and your tests are completely normal, you will receive your results only by: [name] MyChart Message (if you have MyChart) OR . A paper copy in the mail If you have any lab test that is abnormal or we need to change your treatment, we will call you to review the results.   Testing/Procedures: none   Follow-Up: At [facility], you and your health needs are our priority.  As part of our continuing mission to provide you with exceptional heart care, we have created designated Provider Care Teams.  These Care Teams include your primary Cardiologist (physician) and Advanced Practice Providers (APPs -  Physician Assistants and Nurse Practitioners) who all work together to provide you with the care you need, when you need it.  We recommend signing up for the patient portal called "MyChart".  Sign up information is provided on this After Visit Summary.  MyChart is used to connect with patients for Virtual Visits (Telemedicine).  Patients are able to view lab/test results, encounter notes, upcoming appointments, etc.  Non-urgent messages can be sent to your provider as well.   To learn more about what you can do with MyChart, go to [email or web address].    Your next appointment:   18 month(s)  The format for your next appointment:   In Person  Provider:   You may see Mertie Moores, MD or one of the following Advanced Practice Providers on your designated Care Team:    Richardson Dopp, PA-C  Robbie Lis, [place]    Other Instructions Provider Referral Exercise Program

## 2020-12-22 ENCOUNTER — Other Ambulatory Visit: Payer: Medicare Other

## 2020-12-24 ENCOUNTER — Telehealth: Payer: Self-pay

## 2020-12-24 NOTE — Telephone Encounter (Signed)
Call placed to pt reference PREP referral Previously referred however wanted to wait due to covid concerns in community Explained program and cost to pt Just finishing up PT for back (2 months) Having surgery on leg next week and will be unable to exercise May 1st he will go to his summer home in [place] til November Doesn't think he can do PREP right now Explained that he could when he returns in Nov  Talked thru exercising on own and there is a [place] near his [place] in [place] that he could possibly work out in while away Asked that he ring me when he returns from [place] if he is interested in doing PREP

## 2020-12-27 ENCOUNTER — Other Ambulatory Visit (HOSPITAL_COMMUNITY)
Admission: RE | Admit: 2020-12-27 | Discharge: 2020-12-27 | Disposition: A | Discharge status: Home / Self Care | Payer: Medicare Other | Source: Ambulatory Visit | Attending: Surgery | Admitting: Surgery

## 2020-12-27 DIAGNOSIS — Z01812 Encounter for preprocedural laboratory examination: Secondary | ICD-10-CM | POA: Diagnosis present

## 2020-12-27 DIAGNOSIS — Z20822 Contact with and (suspected) exposure to covid-19: Secondary | ICD-10-CM | POA: Diagnosis not present

## 2020-12-28 LAB — SARS CORONAVIRUS 2 (TAT 6-24 HRS): SARS Coronavirus 2: NEGATIVE

## 2020-12-29 ENCOUNTER — Encounter (HOSPITAL_COMMUNITY): Payer: Self-pay | Admitting: Surgery

## 2020-12-29 NOTE — Progress Notes (Signed)
Anesthesia Chart Review: Same day workup  Follows with cardiology for hx of NICM, severe OSA on CPAP, orthostatic hypotension on midodrine. Preop clearance per telephone encounter 11/19/20, "Chart reviewed as part of pre-operative protocol coverage. Patient was contacted 11/19/2020 in reference to pre-operative risk assessment for pending surgery as outlined below.  Raylee Adamec was last seen on 09/13/20 by Dr.Bensimhon. H/o morbid obesity s/p GB, DM2, systolic CHF, NICM, severe OSA, midodrine use for orthostatic hypotension with probable autonomic dysfunction, minimal CAD by cath 05/2020. See his note for full details. Last EF 44% by cMRI 07/2020. At last OV he was felt to be doing well. I reached out to patient for update on how he is doing. The patient affirms he has been doing well without any new cardiac symptoms since that visit. He is working with PT without any recent anginal pain. He has stable mild DOE with higher levels of activity but is able to participate in PT and walk up steps without limitation. RCRI 0.9% indicating low risk of CV complications. Therefore, based on ACC/AHA guidelines, the patient would be at acceptable risk for the planned procedure without further cardiovascular testing."  Hx of gastric bypass.  Small hiatal hernia noted on abdominal MRI 12/03/20.  Will need DOS labs and eval.  EKG 07/04/20: NSR. Rate 82. LAD. Minimal voltage criteria for LVH, may be normal variant. Nonspecific T wave abnormality.   Cardiac MRI 08/02/20: IMPRESSION: 1. Normal LV size and wall thickness, diffuse mild hypokinesis with EF 44%.  2.  Normal RV size and systolic function, EF 69%.  3. On delayed enhancement imaging, there was a very small area of subendocardial apical inferior LGE, seen on short axis images but not reproduced on long axis images. Significance is uncertain,?very small area of infarction.  R/L cath 05/19/20:  There is moderate left ventricular systolic  dysfunction.  The left ventricular ejection fraction is 25-35% by visual estimate.  LV end diastolic pressure is normal.  There is no aortic valve stenosis.  Mild, scattered, nonobstructive coronary atherosclerosis.  Ao sat 90%, PA sat 66%; PA 40/17, mean PA 26 mm Hg; mean PCWP 15 mm Hg; CO 7.8 L/min; CI 3.4   Continue medical therapy.  Plan for CHF clinic eval.   Nuclear stress 02/11/20:  Nuclear stress EF: 33%. The left ventricular ejection fraction is moderately decreased (30-44%).  There was no ST segment deviation noted during stress.  This is an intermediate risk study.  There is no evidence of ischemia a or previous MI. This is an intermediate risk study based on the reduction of LV function. Visually, the LV function appears to be greater than the 33% calculated by the computer.  I would suggest getting an echocardiogram for further evaluation of LV function.  TTE 01/26/20: 1. Low normal GLS -15.2 Overall EF similar to echo done November 2020 .  Left ventricular ejection fraction, by estimation, is 30 to 35%. The left  ventricle has moderately decreased function. The left ventricle  demonstrates global hypokinesis. The left  ventricular internal cavity size was mildly dilated. Left ventricular  diastolic parameters are consistent with Grade I diastolic dysfunction  (impaired relaxation).  2. Right ventricular systolic function is normal. The right ventricular  size is normal.  3. Left atrial size was moderately dilated.  4. The mitral valve is normal in structure. Trivial mitral valve  regurgitation. No evidence of mitral stenosis.  5. The aortic valve is tricuspid. Aortic valve regurgitation is trivial.  No aortic stenosis is present.  6. The inferior vena cava is normal in size with greater than 50%  respiratory variability, suggesting right atrial pressure of 3 mmHg.   Comparison(s): 08/27/19 EF 30-35%.    Wynonia Musty [facility] Short Stay  Center/Anesthesiology Phone [phone] 12/29/2020 10:57 AM

## 2020-12-29 NOTE — Anesthesia Preprocedure Evaluation (Addendum)
Anesthesia Evaluation  Patient identified by MRN, date of birth, ID band Patient awake    Reviewed: Allergy & Precautions, NPO status , Patient's Chart, lab work & pertinent test results  Airway Mallampati: II  TM Distance: >3 FB Neck ROM: Full    Dental no notable dental hx. (+) Dental Advisory Given   Pulmonary sleep apnea ,    Pulmonary exam normal breath sounds clear to auscultation       Cardiovascular Pt. on home beta blockers +CHF  Normal cardiovascular exam Rhythm:Regular Rate:Normal  Echo 01/26/2020  1. Low normal GLS -15.2 Overall EF similar to echo done November 2020 . Left ventricular ejection fraction, by estimation, is 30 to 35%. The left ventricle has moderately decreased function. The left ventricle  demonstrates global hypokinesis. The left ventricular internal cavity size was mildly dilated. Left ventricular  diastolic parameters are consistent with Grade I diastolic dysfunction (impaired relaxation).  2. Right ventricular systolic function is normal. The right ventricular size is normal.  3. Left atrial size was moderately dilated.  4. The mitral valve is normal in structure. Trivial mitral valve regurgitation. No evidence of mitral stenosis.  5. The aortic valve is tricuspid. Aortic valve regurgitation is trivial. No aortic stenosis is present.  6. The inferior vena cava is normal in size with greater than 50% respiratory variability, suggesting right atrial pressure of 3 mmHg.    Neuro/Psych negative neurological ROS     GI/Hepatic negative GI ROS, Neg liver ROS,   Endo/Other  Hypothyroidism   Renal/GU negative Renal ROS     Musculoskeletal negative musculoskeletal ROS (+)   Abdominal   Peds  Hematology negative hematology ROS (+)   Anesthesia Other Findings   Reproductive/Obstetrics                            Anesthesia Physical Anesthesia Plan  ASA:  IV  Anesthesia Plan: General   Post-op Pain Management:    Induction: Intravenous  PONV Risk Score and Plan: 2 and Propofol infusion, Ondansetron, Treatment may vary due to age or medical condition and TIVA  Airway Management Planned: Mask  Additional Equipment:   Intra-op Plan:   Post-operative Plan:   Informed Consent: I have reviewed the patients History and Physical, chart, labs and discussed the procedure including the risks, benefits and alternatives for the proposed anesthesia with the patient or authorized representative who has indicated his/her understanding and acceptance.     Dental advisory given  Plan Discussed with: CRNA  Anesthesia Plan Comments: (PAT note by Karoline Caldwell, PA-C:  Follows with cardiology for hx of NICM, severe OSA on CPAP, orthostatic hypotension on midodrine. Preop clearance per telephone encounter 11/19/20, "Chart reviewed as part of pre-operative protocol coverage. Patient was contacted 11/19/2020 in reference to pre-operative risk assessment for pending surgery as outlined below.  Steven Beard was last seen on 09/13/20 by Dr.Bensimhon. H/o morbid obesity s/p GB, DM2, systolic CHF, NICM, severe OSA, midodrine use for orthostatic hypotension with probable autonomic dysfunction, minimal CAD by cath 05/2020. See his note for full details. Last EF 44% by cMRI 07/2020. At last OV he was felt to be doing well. I reached out to patient for update on how he is doing. The patient affirms he has been doing well without any new cardiac symptoms since that visit. He is working with PT without any recent anginal pain. He has stable mild DOE with higher levels of activity but is able to  participate in PT and walk up steps without limitation. RCRI 0.9% indicating low risk of CV complications. Therefore, based on ACC/AHA guidelines, the patient would be at acceptable risk for the planned procedure without further cardiovascular testing."  Hx of gastric bypass.  Small  hiatal hernia noted on abdominal MRI 12/03/20.  Will need DOS labs and eval.  EKG 07/04/20: NSR. Rate 82. LAD. Minimal voltage criteria for LVH, may be normal variant. Nonspecific T wave abnormality.   Cardiac MRI 08/02/20: IMPRESSION: 1. Normal LV size and wall thickness, diffuse mild hypokinesis with EF 44%.  2.  Normal RV size and systolic function, EF 89%.  3. On delayed enhancement imaging, there was a very small area of subendocardial apical inferior LGE, seen on short axis images but not reproduced on long axis images. Significance is uncertain,?very small area of infarction.  R/L cath 05/19/20: There is moderate left ventricular systolic dysfunction. The left ventricular ejection fraction is 25-35% by visual estimate. LV end diastolic pressure is normal. There is no aortic valve stenosis. Mild, scattered, nonobstructive coronary atherosclerosis. Ao sat 90%, PA sat 66%; PA 40/17, mean PA 26 mm Hg; mean PCWP 15 mm Hg; CO 7.8 L/min; CI 3.4   Continue medical therapy.  Plan for CHF clinic eval.   Nuclear stress 02/11/20: Nuclear stress EF: 33%. The left ventricular ejection fraction is moderately decreased (30-44%). There was no ST segment deviation noted during stress. This is an intermediate risk study. There is no evidence of ischemia a or previous MI. This is an intermediate risk study based on the reduction of LV function. Visually, the LV function appears to be greater than the 33% calculated by the computer. I would suggest getting an echocardiogram for further evaluation of LV function.  TTE 01/26/20: 1. Low normal GLS -15.2 Overall EF similar to echo done November 2020 .  Left ventricular ejection fraction, by estimation, is 30 to 35%. The left  ventricle has moderately decreased function. The left ventricle  demonstrates global hypokinesis. The left  ventricular internal cavity size was mildly dilated. Left ventricular  diastolic parameters are consistent with  Grade I diastolic dysfunction  (impaired relaxation).  2. Right ventricular systolic function is normal. The right ventricular  size is normal.  3. Left atrial size was moderately dilated.  4. The mitral valve is normal in structure. Trivial mitral valve  regurgitation. No evidence of mitral stenosis.  5. The aortic valve is tricuspid. Aortic valve regurgitation is trivial.  No aortic stenosis is present.  6. The inferior vena cava is normal in size with greater than 50%  respiratory variability, suggesting right atrial pressure of 3 mmHg.   Comparison(s): 08/27/19 EF 30-35%.  )      Anesthesia Quick Evaluation

## 2020-12-29 NOTE — Progress Notes (Signed)
Patient denies shortness of breath, fever, cough or chest pain.  PCP - Dr Ian Bushman Cardiologist - Dr Pauletta Browns  Chest x-ray - n/a EKG - 07/04/20 Stress Test - 02/11/20 ECHO - 01/26/20 Cardiac Cath - 05/19/20  Sleep Study -  Yes CPAP - uses cpap nightly  ERAS: Clear liquids til 11 am on the morning of surgery.  Anesthesia review: Yes  STOP now taking any Aspirin (unless otherwise instructed by your surgeon), Aleve, Naproxen, Ibuprofen, Motrin, Advil, Goody's, BC's, all herbal medications, fish oil, and all vitamins.   Coronavirus Screening Covid test on 12/27/20 was negative.  Patient verbalized understanding of instructions that were given via phone.

## 2020-12-29 NOTE — H&P (Signed)
  Steven Beard  Location: [facility] Patient #: [id number] DOB: May 03, 1946 Married / Language: Undefined / Race: Refused to Report/Unreported Male   History of Present Illness  The patient is a 75 year old male who presents with a complaint of Hidradenitis. He is here today to again reevaluate his hidradenitis. He has had another flare at the same area on the left thigh which is now third time. He has had excision of hidradenitis elsewhere in the past. This area again improved with doxycycline   Past Medical History:  Diagnosis Date  . Hidradenitis   . History of BPH    benign prostatic hypertrophy  . History of hydrocele    in right testicle   . Hypothyroidism   . NICM (nonischemic cardiomyopathy) ([place])   . OSA (obstructive sleep apnea) 12/08/2020   Severe OSA with an AHI of 47.3/hr with mild central sleep apnea with pAHIc of 6.7/hr and nocturnal hypoxemia with a minimum O2 sat of 78% and < 88% for 15 minutes c/w nocturnal hypoxemia.   . S/P gastric bypass          Past Surgical History:  Procedure Laterality Date  . CARDIOVASCULAR STRESS TEST     CARDIOLYTE ST LATE 2000's AT [facility] OFFICE REPORTEDLY NEGATIVE FOR ISCHEMIA  . ECHO DOPPLER COMPLETE(TRANSTHOR) ([facility] HX)     11/2010 HT ECHO W/COLORFLOW/SPECTRAL ([facility] HISTORICAL RESULT) LVEF 35%, GLOBAL HK, MILD MR/TR/AR, 2013 STABLE LVEF 35% GLOBAL HK, NO CHANGE C/W 2012, LVEF 40% WITH GLOBAL HK; TRIVIAL MR/AR, NO EFFUSION  . GASTRIC BYPASS     hx  . HYDROCELE EXCISION  2012   REPAIR  . RIGHT/LEFT HEART CATH AND CORONARY ANGIOGRAPHY N/A 05/19/2020   Procedure: RIGHT/LEFT HEART CATH AND CORONARY ANGIOGRAPHY;  Surgeon: Jettie Booze, MD;  Location: [place] [facility];  Service: Cardiovascular;  Laterality: N/A;     Physical Exam  The physical exam findings are as follows: Note: The area on the left inner thigh is now healed but there are 3 nodules in the area with induration consistent  with hidradenitis    Assessment & Plan   HIDRADENITIS (L73.2)  Impression: As he has now had multiple recurrences of hidradenitis in the same area he would like to go ahead and proceed with surgical excision of this area which I feel is very reasonable. I discussed the procedure with him in detail. I believe we can do this with a Mac anesthesia as it is a small localized area set of a general anesthetic. This would also be as an outpatient. We discussed the risks which includes but is not limited to bleeding, infection, recurrence, wound breakdown and having a chronic open wound, cardiopulmonary issues, etc. We will still need cardiac clearance.

## 2020-12-30 ENCOUNTER — Encounter (HOSPITAL_COMMUNITY)
Admission: RE | Disposition: A | Discharge status: Home / Self Care | Payer: Self-pay | Source: Home / Self Care | Attending: Surgery

## 2020-12-30 ENCOUNTER — Ambulatory Visit (HOSPITAL_COMMUNITY)
Admission: RE | Admit: 2020-12-30 | Discharge: 2020-12-30 | Disposition: A | Discharge status: Home / Self Care | Payer: Medicare Other | Attending: Surgery | Admitting: Surgery

## 2020-12-30 ENCOUNTER — Ambulatory Visit (HOSPITAL_COMMUNITY): Payer: Medicare Other | Admitting: Physician Assistant

## 2020-12-30 ENCOUNTER — Encounter (HOSPITAL_COMMUNITY): Payer: Self-pay | Admitting: Surgery

## 2020-12-30 ENCOUNTER — Other Ambulatory Visit: Payer: Self-pay

## 2020-12-30 DIAGNOSIS — L732 Hidradenitis suppurativa: Secondary | ICD-10-CM | POA: Diagnosis present

## 2020-12-30 DIAGNOSIS — Z9884 Bariatric surgery status: Secondary | ICD-10-CM | POA: Diagnosis not present

## 2020-12-30 DIAGNOSIS — G4733 Obstructive sleep apnea (adult) (pediatric): Secondary | ICD-10-CM | POA: Diagnosis not present

## 2020-12-30 DIAGNOSIS — I428 Other cardiomyopathies: Secondary | ICD-10-CM | POA: Insufficient documentation

## 2020-12-30 DIAGNOSIS — N400 Enlarged prostate without lower urinary tract symptoms: Secondary | ICD-10-CM | POA: Diagnosis not present

## 2020-12-30 HISTORY — PX: HYDRADENITIS EXCISION: SHX5243

## 2020-12-30 LAB — BASIC METABOLIC PANEL
Anion gap: 8 (ref 5–15)
BUN: 23 mg/dL (ref 8–23)
CO2: 25 mmol/L (ref 22–32)
Calcium: 8.6 mg/dL — ABNORMAL LOW (ref 8.9–10.3)
Chloride: 104 mmol/L (ref 98–111)
Creatinine, Ser: 1.21 mg/dL (ref 0.61–1.24)
GFR, Estimated: >60 mL/min (ref >60)
Glucose, Bld: 149 mg/dL — ABNORMAL HIGH (ref 70–99)
Potassium: 4.3 mmol/L (ref 3.5–5.1)
Sodium: 137 mmol/L (ref 135–145)

## 2020-12-30 SURGERY — EXCISION, HIDRADENITIS, AXILLA
Anesthesia: General | Laterality: Left

## 2020-12-30 MED ORDER — LACTATED RINGERS IV SOLN: INTRAVENOUS | Status: DC

## 2020-12-30 MED ORDER — FENTANYL CITRATE (PF) 100 MCG/2ML IJ SOLN: 25.0000 ug | INTRAMUSCULAR | Status: DC | PRN

## 2020-12-30 MED ORDER — CHLORHEXIDINE GLUCONATE CLOTH 2 % EX PADS: 6.0000 | MEDICATED_PAD | Freq: Once | CUTANEOUS | Status: DC

## 2020-12-30 MED ORDER — TRAMADOL HCL 50 MG PO TABS: 50.0000 mg | ORAL_TABLET | Freq: Four times a day (QID) | ORAL | 0 refills | Status: AC | PRN

## 2020-12-30 MED ORDER — CEFAZOLIN SODIUM-DEXTROSE 2-4 GM/100ML-% IV SOLN
2.0000 g | INTRAVENOUS | Status: AC
  Administered 2020-12-30: 2 g via INTRAVENOUS

## 2020-12-30 MED ORDER — CHLORHEXIDINE GLUCONATE 0.12 % MT SOLN: 15.0000 mL | Freq: Once | OROMUCOSAL | Status: AC

## 2020-12-30 MED ORDER — ENSURE PRE-SURGERY PO LIQD: 296.0000 mL | Freq: Once | ORAL | Status: DC

## 2020-12-30 MED ORDER — FENTANYL CITRATE (PF) 250 MCG/5ML IJ SOLN
INTRAMUSCULAR | Status: AC
  Filled 2020-12-30: qty 5

## 2020-12-30 MED ORDER — LIDOCAINE-EPINEPHRINE 1 %-1:100000 IJ SOLN
INTRAMUSCULAR | Status: DC | PRN
  Administered 2020-12-30: 20 mL

## 2020-12-30 MED ORDER — LIDOCAINE-EPINEPHRINE 1 %-1:100000 IJ SOLN
INTRAMUSCULAR | Status: AC
  Filled 2020-12-30: qty 1

## 2020-12-30 MED ORDER — FENTANYL CITRATE (PF) 100 MCG/2ML IJ SOLN
INTRAMUSCULAR | Status: DC | PRN
  Administered 2020-12-30: 100 ug via INTRAVENOUS

## 2020-12-30 MED ORDER — CEFAZOLIN SODIUM-DEXTROSE 2-4 GM/100ML-% IV SOLN
INTRAVENOUS | Status: AC
  Filled 2020-12-30: qty 100

## 2020-12-30 MED ORDER — ACETAMINOPHEN 500 MG PO TABS
ORAL_TABLET | ORAL | Status: AC
  Administered 2020-12-30: 1000 mg via ORAL
  Filled 2020-12-30: qty 2

## 2020-12-30 MED ORDER — PROPOFOL 500 MG/50ML IV EMUL
INTRAVENOUS | Status: DC | PRN
  Administered 2020-12-30: 100 ug/kg/min via INTRAVENOUS

## 2020-12-30 MED ORDER — ACETAMINOPHEN 500 MG PO TABS: 1000.0000 mg | ORAL_TABLET | ORAL | Status: AC

## 2020-12-30 MED ORDER — ONDANSETRON HCL 4 MG/2ML IJ SOLN
INTRAMUSCULAR | Status: DC | PRN
  Administered 2020-12-30: 4 mg via INTRAVENOUS

## 2020-12-30 MED ORDER — MIDAZOLAM HCL 2 MG/2ML IJ SOLN
INTRAMUSCULAR | Status: AC
  Filled 2020-12-30: qty 2

## 2020-12-30 MED ORDER — ORAL CARE MOUTH RINSE: 15.0000 mL | Freq: Once | OROMUCOSAL | Status: AC

## 2020-12-30 MED ORDER — AMISULPRIDE (ANTIEMETIC) 5 MG/2ML IV SOLN: 10.0000 mg | Freq: Once | INTRAVENOUS | Status: DC | PRN

## 2020-12-30 MED ORDER — 0.9 % SODIUM CHLORIDE (POUR BTL) OPTIME
TOPICAL | Status: DC | PRN
  Administered 2020-12-30: 1000 mL

## 2020-12-30 MED ORDER — MIDAZOLAM HCL 5 MG/5ML IJ SOLN
INTRAMUSCULAR | Status: DC | PRN
  Administered 2020-12-30: 2 mg via INTRAVENOUS

## 2020-12-30 MED ORDER — CHLORHEXIDINE GLUCONATE 0.12 % MT SOLN
OROMUCOSAL | Status: AC
  Administered 2020-12-30: 15 mL via OROMUCOSAL
  Filled 2020-12-30: qty 15

## 2020-12-30 SURGICAL SUPPLY — 33 items
CANISTER SUCT 3000ML PPV (MISCELLANEOUS) ×2 IMPLANT
CHLORAPREP W/TINT 26 (MISCELLANEOUS) ×2 IMPLANT
COVER SURGICAL LIGHT HANDLE (MISCELLANEOUS) ×2 IMPLANT
COVER WAND RF STERILE (DRAPES) ×2 IMPLANT
DECANTER SPIKE VIAL GLASS SM (MISCELLANEOUS) IMPLANT
DERMABOND ADVANCED (GAUZE/BANDAGES/DRESSINGS)
DERMABOND ADVANCED .7 DNX12 (GAUZE/BANDAGES/DRESSINGS) IMPLANT
DRAPE LAPAROSCOPIC ABDOMINAL (DRAPES) IMPLANT
DRAPE LAPAROTOMY 100X72 PEDS (DRAPES) IMPLANT
ELECT CAUTERY BLADE 6.4 (BLADE) ×2 IMPLANT
ELECT REM PT RETURN 9FT ADLT (ELECTROSURGICAL) ×2
ELECTRODE REM PT RTRN 9FT ADLT (ELECTROSURGICAL) ×1 IMPLANT
GAUZE SPONGE 4X4 12PLY STRL (GAUZE/BANDAGES/DRESSINGS) IMPLANT
GLOVE SURG SIGNA 7.5 PF LTX (GLOVE) ×2 IMPLANT
GOWN STRL REUS W/ TWL LRG LVL3 (GOWN DISPOSABLE) ×1 IMPLANT
GOWN STRL REUS W/ TWL XL LVL3 (GOWN DISPOSABLE) ×1 IMPLANT
GOWN STRL REUS W/TWL LRG LVL3 (GOWN DISPOSABLE) ×1
GOWN STRL REUS W/TWL XL LVL3 (GOWN DISPOSABLE) ×1
KIT BASIN OR (CUSTOM PROCEDURE TRAY) ×2 IMPLANT
KIT TURNOVER KIT B (KITS) ×2 IMPLANT
NEEDLE HYPO 25GX1X1/2 BEV (NEEDLE) ×2 IMPLANT
NS IRRIG 1000ML POUR BTL (IV SOLUTION) ×2 IMPLANT
PACK GENERAL/GYN (CUSTOM PROCEDURE TRAY) ×2 IMPLANT
PAD ABD 8X10 STRL (GAUZE/BANDAGES/DRESSINGS) ×2 IMPLANT
PAD ARMBOARD 7.5X6 YLW CONV (MISCELLANEOUS) ×2 IMPLANT
PENCIL SMOKE EVACUATOR (MISCELLANEOUS) ×2 IMPLANT
SPECIMEN JAR MEDIUM (MISCELLANEOUS) ×2 IMPLANT
SUT ETHILON 3 0 FSL (SUTURE) IMPLANT
SUT MNCRL AB 4-0 PS2 18 (SUTURE) IMPLANT
SUT VIC AB 3-0 SH 18 (SUTURE) IMPLANT
SYR CONTROL 10ML LL (SYRINGE) ×2 IMPLANT
TOWEL GREEN STERILE (TOWEL DISPOSABLE) ×2 IMPLANT
TOWEL GREEN STERILE FF (TOWEL DISPOSABLE) ×2 IMPLANT

## 2020-12-30 NOTE — Transfer of Care (Signed)
Immediate Anesthesia Transfer of Care Note  Patient: Steven Beard  Procedure(s) Performed: EXCISION HIDRADENITIS LEFT INNER THIGH (Left )  Patient Location: PACU  Anesthesia Type:MAC  Level of Consciousness: awake, alert  and oriented  Airway & Oxygen Therapy: Patient Spontanous Breathing  Post-op Assessment: Report given to RN and Post -op Vital signs reviewed and stable  Post vital signs: Reviewed and stable  Last Vitals:  Vitals Value Taken Time  BP 146/67 12/30/20 1540  Temp    Pulse 60 12/30/20 1541  Resp 14 12/30/20 1541  SpO2 98 % 12/30/20 1541  Vitals shown include unvalidated device data.  Last Pain:  Vitals:   12/30/20 1214  TempSrc:   PainSc: 0-No pain         Complications: No complications documented.

## 2020-12-30 NOTE — Discharge Instructions (Signed)
You may shower starting tomorrow  Cover the incision with a dry bandage and change daily.  Expect drainage from the incision.  You may also use Tylenol, ibuprofen, and ice pack for pain  No vigorous activity until the sutures are removed

## 2020-12-30 NOTE — Op Note (Signed)
EXCISION HIDRADENITIS LEFT INNER THIGH  Procedure Note  Andrea Colglazier 12/30/2020   Pre-op Diagnosis: hidradenitis      Post-op Diagnosis: same  Procedure(s): EXCISION HIDRADENITIS LEFT INNER THIGH  Surgeon(s): Coralie Keens, MD  Anesthesia: Monitor Anesthesia Care  Staff:  Circulator: Ronney Lion, RN Scrub Person: Harrel Lemon, RN; Redge Gainer, RN  Estimated Blood Loss: Minimal  Procedure: The patient brought to operating identifies the correct patient.  He is placed upon the operating table and anesthesia was induced.  He was then placed in the frog-leg position.  His left inner thigh was prepped and draped in usual sterile fashion.  I anesthetized skin around the chronic scars from the hidradenitis with lidocaine with epinephrine.  I then made an elliptical incision around this area with a scalpel.  I then excised 16 cm of skin and subcutaneous tissue with the cautery.  This removed all the area of hidradenitis.  The underlying fatty tissue appeared normal.  I then closed the incision with interrupted 2-0 nylon sutures after achieve hemostasis.  Gauze and tape were then applied.  The patient tolerated the procedure well.  All the counts were correct at the end of the procedure.  The patient was then taken in stable addition from the operating room to the recovery room.          Coralie Keens   Date: 12/30/2020  Time: 3:34 PM

## 2020-12-30 NOTE — Interval H&P Note (Signed)
History and Physical Interval Note:no change in H and P  12/30/2020 1:42 PM  Cloud Graham  has presented today for surgery, with the diagnosis of hidradenitis.  The various methods of treatment have been discussed with the patient and family. After consideration of risks, benefits and other options for treatment, the patient has consented to  Procedure(s) with comments: EXCISION HIDRADENITIS LEFT INNER THIGH (Left) - 58 MIN as a surgical intervention.  The patient's history has been reviewed, patient examined, no change in status, stable for surgery.  I have reviewed the patient's chart and labs.  Questions were answered to the patient's satisfaction.     Coralie Keens

## 2020-12-31 ENCOUNTER — Encounter (HOSPITAL_COMMUNITY): Payer: Self-pay | Admitting: Surgery

## 2020-12-31 NOTE — Anesthesia Postprocedure Evaluation (Signed)
Anesthesia Post Note  Patient: Steven Beard  Procedure(s) Performed: EXCISION HIDRADENITIS LEFT INNER THIGH (Left )     Patient location during evaluation: PACU Anesthesia Type: General Level of consciousness: sedated and patient cooperative Pain management: pain level controlled Vital Signs Assessment: post-procedure vital signs reviewed and stable Respiratory status: spontaneous breathing Cardiovascular status: stable Anesthetic complications: no   No complications documented.  Last Vitals:  Vitals:   12/30/20 1555 12/30/20 1608  BP: (!) 158/77 (!) 141/83  Pulse: (!) 55 (!) 58  Resp: 14 14  Temp:  (!) 36.3 C  SpO2: 97% 100%    Last Pain:  Vitals:   12/30/20 1608  TempSrc:   PainSc: 0-No pain                 Nolon Nations

## 2021-01-07 ENCOUNTER — Other Ambulatory Visit: Payer: Medicare Other

## 2021-01-10 ENCOUNTER — Encounter: Payer: Self-pay | Admitting: Cardiology

## 2021-01-10 ENCOUNTER — Encounter: Payer: Medicare Other | Admitting: Cardiology

## 2021-01-10 ENCOUNTER — Other Ambulatory Visit: Payer: Self-pay

## 2021-01-10 NOTE — Progress Notes (Signed)
This encounter was created in error - please disregard.

## 2021-01-10 NOTE — H&P (Signed)
     Show:Clear all _0 Manual_1 Template_2 Copied  Added by: _3 Coralie Keens, MD   _4 Hover for details   Steven Beard  Location: [facility] Patient #: [id number] DOB: 01-Sep-1946 Married / Language: Undefined / Race: Refused to Report/Unreported Male   History of Present Illness  The patient is a 75 year old male who presents with a complaint of Hidradenitis. He is here today to again reevaluate his hidradenitis. He has had another flare at the same area on the left thigh which is now third time. He has had excision of hidradenitis elsewhere in the past. This area again improved with doxycycline       Past Medical History:  Diagnosis Date  . Hidradenitis   . History of BPH    benign prostatic hypertrophy  . History of hydrocele    in right testicle   . Hypothyroidism   . NICM (nonischemic cardiomyopathy) ([place])   . OSA (obstructive sleep apnea) 12/08/2020   Severe OSA with an AHI of 47.3/hr with mild central sleep apnea with pAHIc of 6.7/hr and nocturnal hypoxemia with a minimum O2 sat of 78% and <88% for 15 minutes c/w nocturnal hypoxemia.   . S/P gastric bypass          Past Surgical History:  Procedure Laterality Date  . CARDIOVASCULAR STRESS TEST     CARDIOLYTE ST LATE 2000's AT [facility] OFFICE REPORTEDLY NEGATIVE FOR ISCHEMIA  . ECHO DOPPLER COMPLETE(TRANSTHOR) ([facility] HX)     11/2010 HT ECHO W/COLORFLOW/SPECTRAL ([facility] HISTORICAL RESULT) LVEF 35%, GLOBAL HK, MILD MR/TR/AR, 2013 STABLE LVEF 35% GLOBAL HK, NO CHANGE C/W 2012, LVEF 40% WITH GLOBAL HK; TRIVIAL MR/AR, NO EFFUSION  . GASTRIC BYPASS     hx  . HYDROCELE EXCISION  2012   REPAIR  . RIGHT/LEFT HEART CATH AND CORONARY ANGIOGRAPHY N/A 05/19/2020   Procedure: RIGHT/LEFT HEART CATH AND CORONARY ANGIOGRAPHY; Surgeon: Jettie Booze, MD; Location: [place] [facility]; Service: Cardiovascular; Laterality: N/A;   ROS: negative except as above. No fevers,  chils, CP, SOB  Physical Exam  The physical exam findings are as follows: Note: The area on the left inner thigh is now healed but there are 3 nodules in the area with induration consistent with hidradenitis    Assessment & Plan   HIDRADENITIS (L73.2)  Impression: As he has now had multiple recurrences of hidradenitis in the same area he would like to go ahead and proceed with surgical excision of this area which I feel is very reasonable. I discussed the procedure with him in detail. I believe we can do this with a Mac anesthesia as it is a small localized area set of a general anesthetic. This would also be as an outpatient. We discussed the risks which includes but is not limited to bleeding, infection, recurrence, wound breakdown and having a chronic open wound, cardiopulmonary issues, etc. We will still need cardiac clearance.

## 2021-07-18 ENCOUNTER — Telehealth: Payer: Self-pay | Admitting: Cardiovascular Disease

## 2021-07-18 NOTE — Telephone Encounter (Signed)
Called patient with Dr. Elmarie Shiley response to his request as written below.   4:06 PM I think he needs to start with his primary MD first and see what they think.    This could be sciatica or a muscular issue.  If he has good pulses in his legs, its not a vascular issue.   Thanks   PN

## 2021-07-18 NOTE — Telephone Encounter (Signed)
Patient states he has been having aching in his legs. He states it is like when you're on you feet for a long time and your calves ache, but he won't be on his feet for long. He states it has gotten worse over the summer and would like a referral to a vascular doctor. He says to call him back at his summer home: [phone]

## 2021-07-18 NOTE — Telephone Encounter (Signed)
Called patient back about his call. Patient complaining of pain in his legs only when on his feet a lot and it goes away when sitting down. Patient's leg pain is chronic, but has gotten worse over the last couple of months.Patient stated he has tried compression stockings, but it did not make a difference. Patient is coming back to town in 2 weeks to [facility] and was wanting to see a vascular doctor. Will forward to Dr. Acie Fredrickson to see if a referral to vascular can be ordered and for any other advisement.

## 2021-07-22 ENCOUNTER — Other Ambulatory Visit: Payer: Self-pay | Admitting: Gastroenterology

## 2021-07-22 DIAGNOSIS — K862 Cyst of pancreas: Secondary | ICD-10-CM

## 2021-08-12 ENCOUNTER — Other Ambulatory Visit: Payer: Self-pay | Admitting: Cardiovascular Disease

## 2021-08-23 ENCOUNTER — Other Ambulatory Visit: Payer: Medicare Other

## 2021-08-26 ENCOUNTER — Ambulatory Visit
Admission: RE | Admit: 2021-08-26 | Discharge: 2021-08-26 | Disposition: A | Discharge status: Home / Self Care | Payer: Medicare Other | Source: Ambulatory Visit | Attending: Gastroenterology | Admitting: Gastroenterology

## 2021-08-26 ENCOUNTER — Other Ambulatory Visit: Payer: Self-pay

## 2021-08-26 DIAGNOSIS — K862 Cyst of pancreas: Secondary | ICD-10-CM

## 2021-08-26 IMAGING — MR MR ABDOMEN WO/W CM
12 of 18 series · 30 of 48 positions shown · IV contrast (Multihance)
Comparison: [DATE]

CLINICAL DATA: Follow-up pancreatic cyst, splenic lesions

EXAM:
MRI ABDOMEN WITHOUT AND WITH CONTRAST
TECHNIQUE: Multiplanar multisequence MR imaging of the abdomen was performed
both before and after the administration of intravenous contrast.
CONTRAST:  20mL MULTIHANCE GADOBENATE DIMEGLUMINE 529 MG/ML IV SOLN

[Series 3: T2 · axial · 6.0mm · 0.78mm/px · z∈[-130,+165]mm · 2 of 42 slices shown (1 of 3)]
[im 1/42]
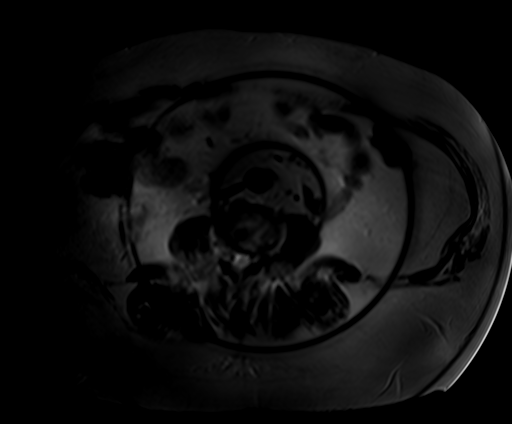
[im 42/42]
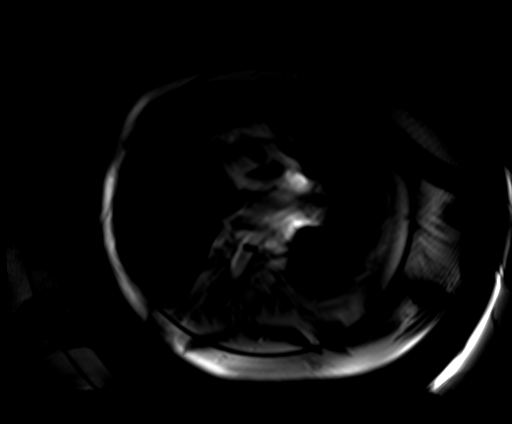

[Series 4: T2 · coronal · 5.0mm · 1.60mm/px · 3 of 49 slices shown (2 of 3)]
[im 1/49]
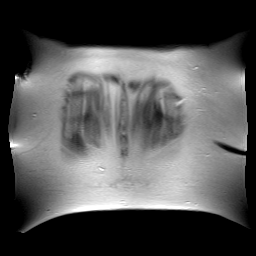
[im 25/49]
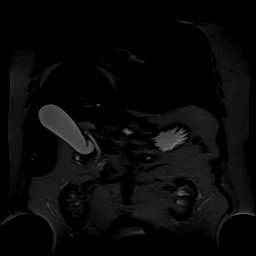
[im 49/49]
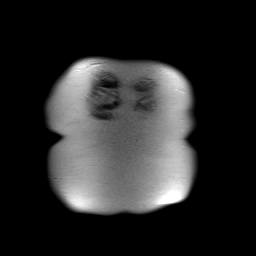

[Series 5: T2 · axial · 5.0mm · 1.56mm/px · z∈[-139,+130]mm · 2 of 44 slices shown (3 of 3)]
[im 1/44]
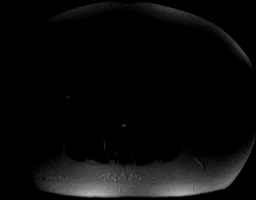
[im 44/44]
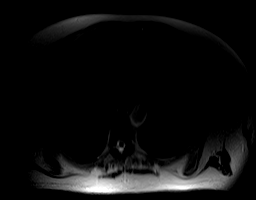

[Series 6: ep2d_diff_b50_500_800_p2_trig · axial · 6.0mm · 2.08mm/px · z∈[-141,+161]mm · 4 of 129 slices shown]
[im 1/129]
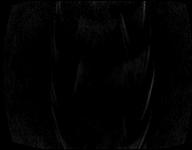
[im 43/129]
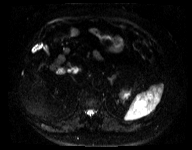
[im 86/129]
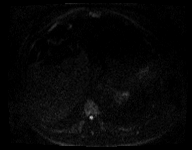
[im 129/129]
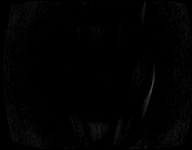

[Series 7: ep2d_[id number]_500_800_p2_trig_adc · axial · 6.0mm · 2.08mm/px · 1 of 43 slices shown]
[im 1/43]
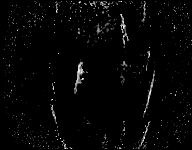

[Series 8: axial in out · axial · 6.0mm · 0.78mm/px · z∈[-141,+133]mm · 3 of 80 slices shown]
[im 1/80]
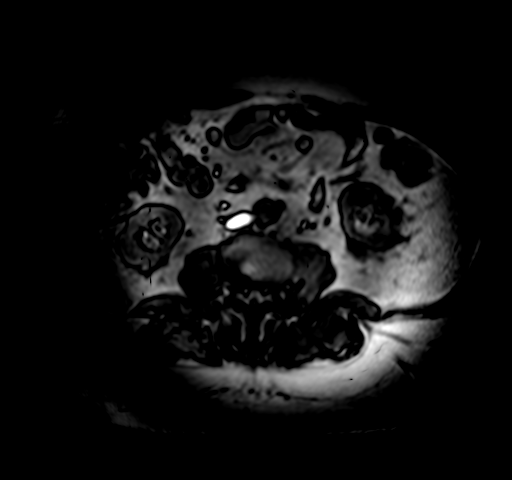
[im 40/80]
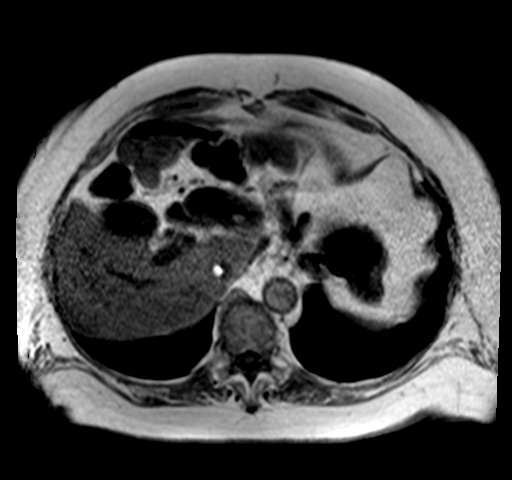
[im 80/80]
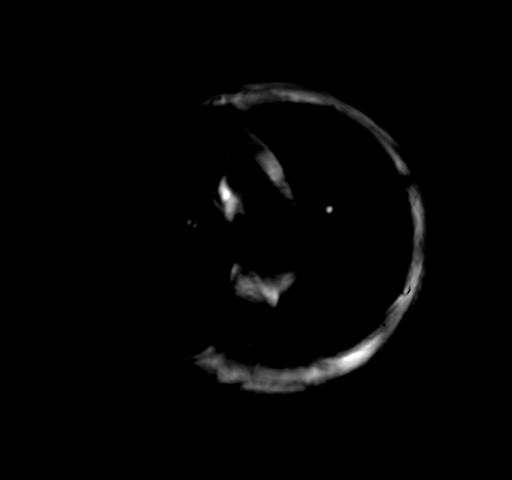

[Series 9: axial tru fisp · axial · 5.0mm · 1.56mm/px · 1 of 47 slices shown]
[im 1/47]
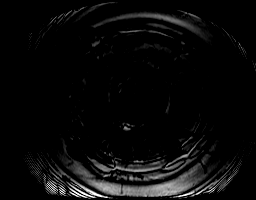

[Series 12: T1 dynamic · axial · non-contrast · 3.0mm · 0.78mm/px · z∈[-157,+152]mm · 3 of 104 slices shown]
[im 1/104]
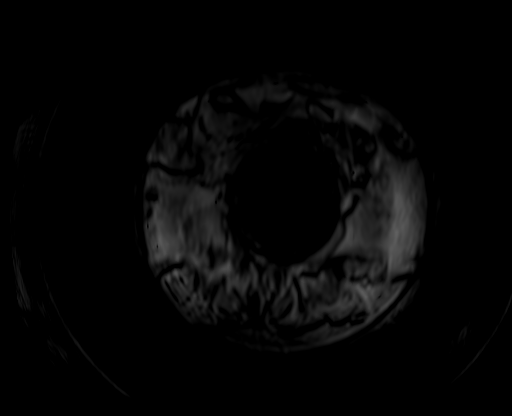
[im 52/104]
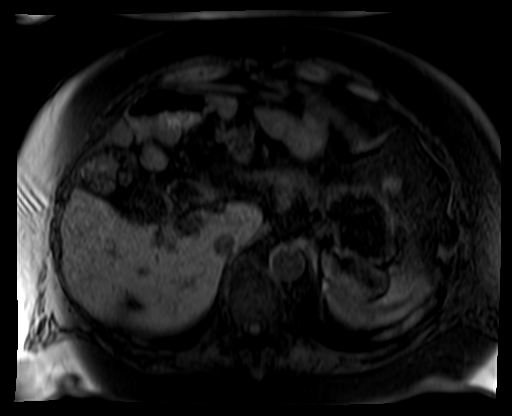
[im 104/104]
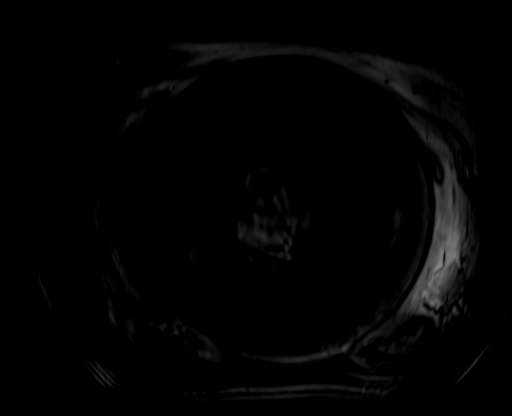

[Series 13: post 25 sec · axial · 3.0mm · 0.78mm/px · z∈[-157,+152]mm · 3 of 104 slices shown]
[im 1/104]
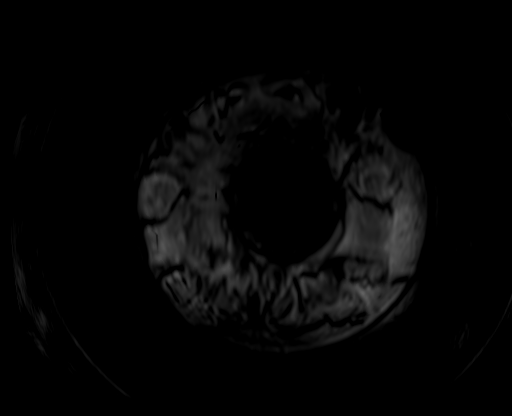
[im 52/104]
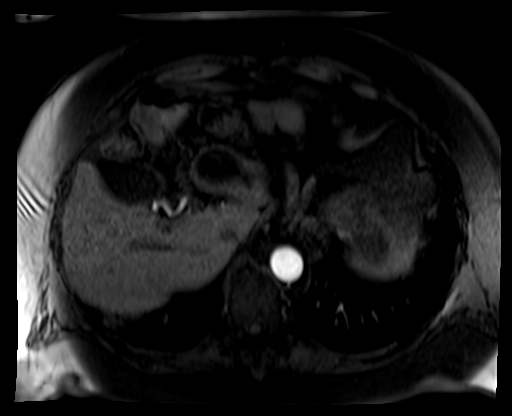
[im 104/104]
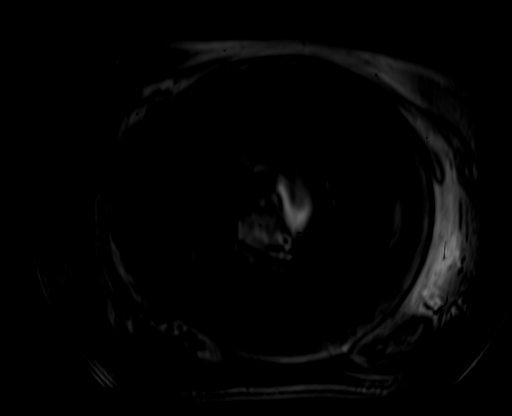

[Series 14: post 25 sec_sub · axial · 3.0mm · 0.78mm/px · z∈[-157,+152]mm · 3 of 104 slices shown]
[im 1/104]
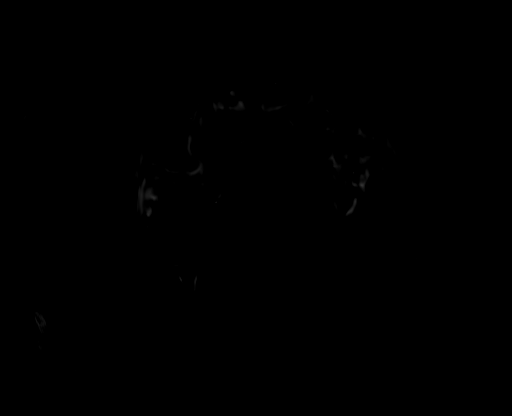
[im 52/104]
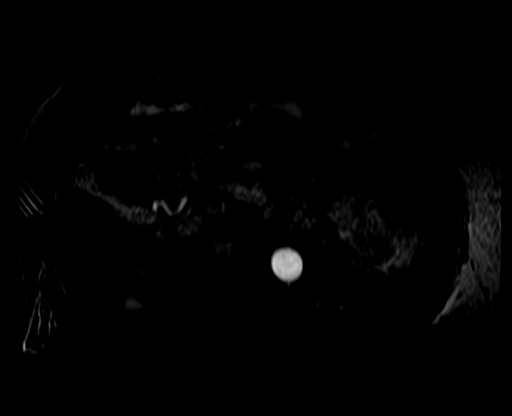
[im 104/104]
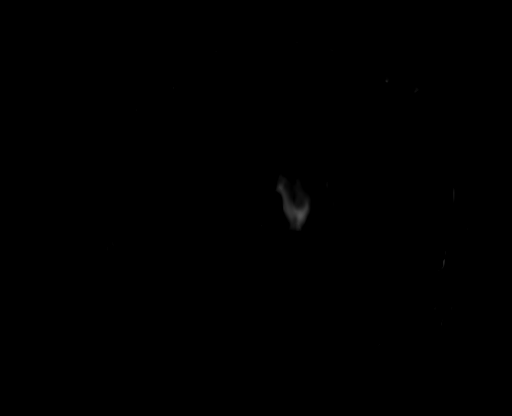

[Series 15: post 45 sec · axial · 3.0mm · 0.78mm/px · z∈[-157,+152]mm · 3 of 104 slices shown]
[im 1/104]
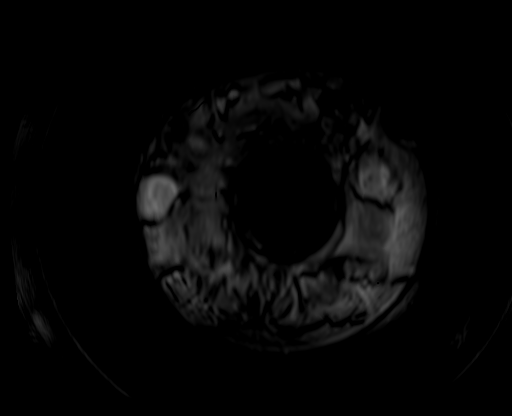
[im 52/104]
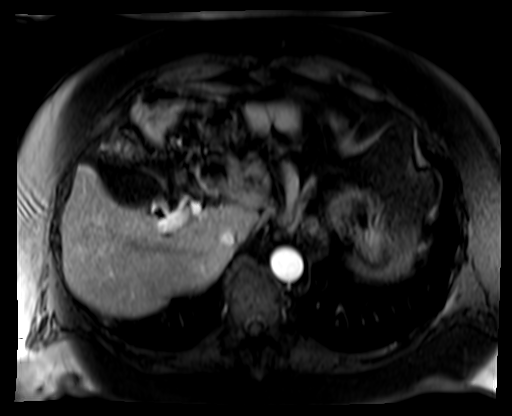
[im 104/104]
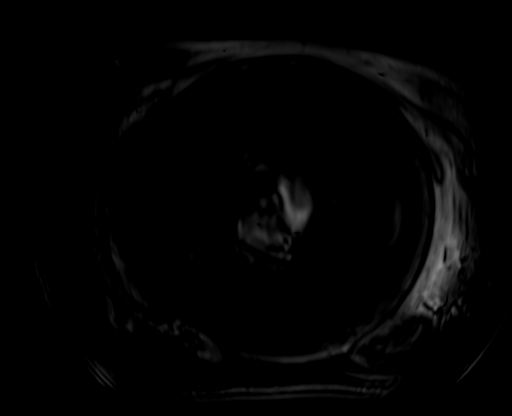

[Series 16: post 45 sec_sub · axial · 3.0mm · 0.78mm/px · z∈[-157,-4]mm · 2 of 104 slices shown]
[im 1/104]
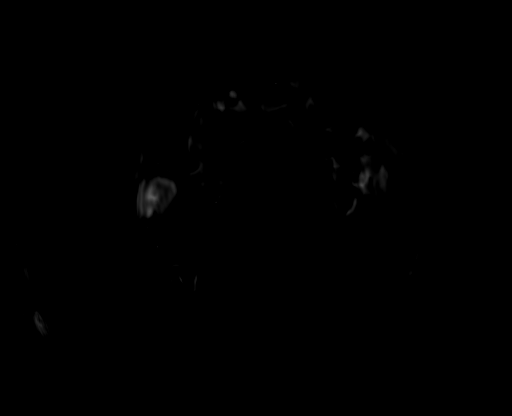
[im 52/104]
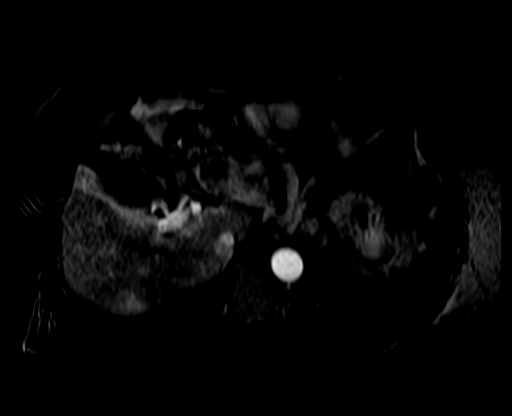

[30 of 48 positions shown; findings below may reference images not displayed]

FINDINGS: Lower chest: No acute findings. Elevation of the right
hemidiaphragm. Hiatal hernia which contains both the gastric fundus
and nonobstructed small bowel (series 17, image 48).

Hepatobiliary: No mass or other parenchymal abnormality identified.

Pancreas: Multiple subcentimeter fluid signal cystic lesions are
again noted in the pancreatic head, not significantly changed and
measuring no greater than 0.7 cm (series 5, image 29). No mass,
inflammatory changes, or other parenchymal abnormality identified.
No pancreatic ductal dilatation.

Spleen: Normal size of the spleen. Unchanged appearance of numerous,
somewhat heterogeneous T2 hyperintense splenic lesions which are
generally hypoenhancing to parenchyma.

Adrenals/Urinary Tract: No masses identified. Simple, benign left
renal cyst. No evidence of hydronephrosis.

Stomach/Bowel: Visualized portions within the abdomen are
unremarkable.

Vascular/Lymphatic: No pathologically enlarged lymph nodes
identified. No abdominal aortic aneurysm demonstrated.

Other:  None.

Musculoskeletal: No suspicious bone lesions identified.
IMPRESSION: 1. Multiple subcentimeter fluid signal cystic lesions in the
pancreatic head, not significantly changed and measuring no greater
than 0.7 cm. These are consistent with small pseudocysts or IPMNs.
As there is no observed increased risk of malignancy for such
lesions measuring smaller than 2 cm, no further follow-up or
characterization is required.
2. Unchanged appearance of numerous, somewhat heterogeneous T2
hyperintense splenic lesions which are generally hypoenhancing to
parenchyma. Although of uncertain nature, these are almost certainly
benign given stability, in general differential considerations
including extramedullary hematopoietic nodules, sarcoid granulomas,
or sequelae of other granulomatous infection.
3. Hiatal hernia which contains both the gastric fundus and
nonobstructed small bowel.

## 2021-08-26 MED ORDER — GADOBENATE DIMEGLUMINE 529 MG/ML IV SOLN
20.0000 mL | Freq: Once | INTRAVENOUS | Status: AC | PRN
  Administered 2021-08-26: 20 mL via INTRAVENOUS

## 2021-11-02 ENCOUNTER — Other Ambulatory Visit: Payer: Self-pay | Admitting: Cardiovascular Disease

## 2021-12-09 ENCOUNTER — Other Ambulatory Visit: Payer: Self-pay

## 2021-12-09 ENCOUNTER — Telehealth (INDEPENDENT_AMBULATORY_CARE_PROVIDER_SITE_OTHER): Payer: Medicare Other | Admitting: Cardiology

## 2021-12-09 ENCOUNTER — Encounter: Payer: Self-pay | Admitting: Cardiology

## 2021-12-09 VITALS — BP 125/73 | HR 53 | Ht 71.0 in | Wt 260.0 lb

## 2021-12-09 DIAGNOSIS — G4733 Obstructive sleep apnea (adult) (pediatric): Secondary | ICD-10-CM | POA: Diagnosis not present

## 2021-12-09 NOTE — Progress Notes (Signed)
Virtual Visit via Video Note   This visit type was conducted due to national recommendations for restrictions regarding the COVID-19 Pandemic (e.g. social distancing) in an effort to limit this patient's exposure and mitigate transmission in our community.  Due to his co-morbid illnesses, this patient is at least at moderate risk for complications without adequate follow up.  This format is felt to be most appropriate for this patient at this time.  All issues noted in this document were discussed and addressed.  A limited physical exam was performed with this format.  Please refer to the patient's chart for his consent to telehealth for [facility].     Date:  12/09/2021   ID:  Steven Beard, DOB 01-Feb-1946, MRN [id number] The patient was identified using 2 identifiers.  Patient Location: Home Provider Location: Home Office   PCP:  Pcp, No   [facility] Providers Cardiologist:  Steven Moores, MD Sleep Medicine:  Steven Him, MD     Evaluation Performed:  Follow-Up Visit  Chief Complaint:  OSA  History of Present Illness:    Steven Beard is a 76 y.o. male with  a hx of NICM followed by Dr. Haroldine Beard, morbid obesity s/p gastric bypass, DM2 and chronic systolic CHF who is referred initially to me for evaluation for sleep apnea.   He did have OSA prior to gastric bypass and was on CPAP but have losing weight he stopped using his PAP and had not been checked since his surgery for residual apnea.  He had not been using a CPAP.     A home sleep study was ordered showing severe OSA with an AHI of 47.3/hr with mild central sleep apnea with pAHIc of 6.7/hr and nocturnal hypoxemia with a minimum O2 sat of 78% and < 88% for 15 minutes c/w nocturnal hypoxemia. He was started on auto CPAP.  He is doing well with his CPAP device and thinks that he has gotten used to it.  He tolerates the mask and feels the pressure is adequate.  Since going on CPAP he feels rested in the am and has no  significant daytime sleepiness but occasionally he will lay back down and fall asleep for a while but thinks it it more related to his BS>>He just started taking Metformin.  He denies any significant mouth or nasal dryness or nasal congestion.  He does not think that he snores.     The patient does not have symptoms concerning for COVID-19 infection (fever, chills, cough, or new shortness of breath).    Past Medical History:  Diagnosis Date   Hidradenitis    History of BPH    benign prostatic hypertrophy   History of hydrocele    in right testicle    Hypothyroidism    NICM (nonischemic cardiomyopathy) (HCC)    OSA (obstructive sleep apnea) 12/08/2020   Severe OSA with an AHI of 47.3/hr with mild central sleep apnea with pAHIc of 6.7/hr and nocturnal hypoxemia with a minimum O2 sat of 78% and < 88% for 15 minutes c/w nocturnal hypoxemia.   uses cpap nightly   S/P gastric bypass    Past Surgical History:  Procedure Laterality Date   CARDIAC CATHETERIZATION  05/19/2020   CARDIOVASCULAR STRESS TEST     CARDIOLYTE ST LATE 2000's AT [facility] OFFICE REPORTEDLY NEGATIVE FOR ISCHEMIA   ECHO DOPPLER COMPLETE(TRANSTHOR) ([facility] HX)     11/2010 HT ECHO W/COLORFLOW/SPECTRAL ([facility] HISTORICAL RESULT) LVEF 35%, GLOBAL HK, MILD MR/TR/AR, 2013 STABLE LVEF 35% GLOBAL  HK, NO CHANGE C/W 2012, LVEF 40% WITH GLOBAL HK; TRIVIAL MR/AR, NO EFFUSION   GASTRIC BYPASS     hx   HYDRADENITIS EXCISION Left 12/30/2020   Procedure: EXCISION HIDRADENITIS LEFT INNER THIGH;  Surgeon: Coralie Keens, MD;  Location: [place];  Service: General;  Laterality: Left;   HYDROCELE EXCISION  2012   REPAIR   RIGHT/LEFT HEART CATH AND CORONARY ANGIOGRAPHY N/A 05/19/2020   Procedure: RIGHT/LEFT HEART CATH AND CORONARY ANGIOGRAPHY;  Surgeon: Jettie Booze, MD;  Location: [place] [facility];  Service: Cardiovascular;  Laterality: N/A;     Current Meds  Medication Sig   acetaminophen (TYLENOL) 500 MG tablet Take 1,000 mg by mouth every  6 (six) hours as needed for moderate pain or headache.   ALPRAZolam (XANAX) 1 MG tablet Take 0.5 mg by mouth at bedtime as needed for anxiety.    buPROPion (WELLBUTRIN SR) 150 MG 12 hr tablet Take 300 mg by mouth daily.   carvedilol (COREG) 3.125 MG tablet TAKE 1 TABLET BY MOUTH  TWICE DAILY WITH A MEAL   finasteride (PROSCAR) 5 MG tablet Take 5 mg by mouth at bedtime.   HYDROcodone-acetaminophen (NORCO/VICODIN) 5-325 MG tablet Take 1 tablet by mouth every 4 (four) hours as needed. (Patient taking differently: Take 1 tablet by mouth every 4 (four) hours as needed for moderate pain.)   levothyroxine (SYNTHROID, LEVOTHROID) 175 MCG tablet Take 175 mcg by mouth See admin instructions. Take Monday - Saturday   meloxicam (MOBIC) 7.5 MG tablet Take 7.5 mg by mouth daily.   metFORMIN (GLUCOPHAGE-XR) 500 MG 24 hr tablet 1,000 mg 2 (two) times daily.   midodrine (PROAMATINE) 2.5 MG tablet TAKE 1 TABLET BY MOUTH 3  TIMES DAILY WITH MEALS   Multiple Vitamins-Minerals (PRESERVISION AREDS) TABS Take 1 tablet by mouth 2 (two) times daily.    tamsulosin (FLOMAX) 0.4 MG CAPS capsule Take 0.4 mg by mouth at bedtime.   traMADol (ULTRAM) 50 MG tablet Take 1 tablet (50 mg total) by mouth every 6 (six) hours as needed for moderate pain or severe pain.   traZODone (DESYREL) 50 MG tablet Take 50 mg by mouth at bedtime.   VITAMIN D PO Take 1 capsule by mouth daily.     Allergies:   Minocycline and Codeine   Social History   Tobacco Use   Smoking status: Never   Smokeless tobacco: Never  Vaping Use   Vaping Use: Never used  Substance Use Topics   Alcohol use: Yes    Alcohol/week: 7.0 standard drinks    Types: 7 Glasses of wine per week   Drug use: Never     Family Hx: The patient's family history includes Heart disease in his father and mother.  ROS:   Please see the history of present illness.     All other systems reviewed and are negative.   Prior CV studies:   The following studies were  reviewed today:  PAP compliance download  Labs/Other Tests and Data Reviewed:    EKG:  No ECG reviewed.  Recent Labs: 12/30/2020: BUN 23; Creatinine, Ser 1.21; Potassium 4.3; Sodium 137   Recent Lipid Panel No results found for: CHOL, TRIG, HDL, CHOLHDL, LDLCALC, LDLDIRECT  Wt Readings from Last 3 Encounters:  12/09/21 260 lb (117.9 kg)  01/10/21 244 lb 6.4 oz (110.9 kg)  12/30/20 234 lb (106.1 kg)     Risk Assessment/Calculations:          Objective:    Vital Signs:  BP 125/73    Pulse (!) 53    Ht 5\' 11"  (1.803 m)    Wt 260 lb (117.9 kg)    SpO2 92%    BMI 36.26 kg/m    VITAL SIGNS:  reviewed GEN:  no acute distress EYES:  sclerae anicteric, EOMI - Extraocular Movements Intact RESPIRATORY:  normal respiratory effort, symmetric expansion CARDIOVASCULAR:  no peripheral edema SKIN:  no rash, lesions or ulcers. MUSCULOSKELETAL:  no obvious deformities. NEURO:  alert and oriented x 3, no obvious focal deficit PSYCH:  normal affect  ASSESSMENT & PLAN:    OSA - The patient is tolerating PAP therapy well without any problems. The PAP download performed by his DME was personally reviewed and interpreted by me today and showed an AHI of 2.5/hr on auto CPAP cm H2O with 77.8% compliance in using more than 4 hours nightly.  The patient has been using and benefiting from PAP use and will continue to benefit from therapy.    COVID-19 Education: The signs and symptoms of COVID-19 were discussed with the patient and how to seek care for testing (follow up with PCP or arrange E-visit).  The importance of social distancing was discussed today.  Time:   Today, I have spent 15 minutes with the patient with telehealth technology discussing the above problems.     Medication Adjustments/Labs and Tests Ordered: Current medicines are reviewed at length with the patient today.  Concerns regarding medicines are outlined above.   Tests Ordered: No orders of the defined types were placed  in this encounter.   Medication Changes: No orders of the defined types were placed in this encounter.   Follow Up:  In Person in 1 year(s)  Signed, Steven Him, MD  12/09/2021 9:42 AM    [place]

## 2021-12-09 NOTE — Patient Instructions (Signed)

## 2022-01-29 NOTE — Progress Notes (Signed)
? ?Office Visit Note ? ?Patient: Steven Beard             ?Date of Birth: 10-Jan-1946           ?MRN: [id number]             ?PCP: Pcp, No ?Referring: Bobbe Medico, MD ?Visit Date: 01/30/2022 ?Occupation: Retired Estate manager/land agent ? ?Subjective:  ?New Patient (Initial Visit) (R/O Padgett's Disease) ? ? ?History of Present Illness: Steven Beard is a 76 y.o. 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 MRI evaluating back and hip pains showing mottling in pelvis. Labs with elevated alkaline phosphatase ranging 141-162 with normal serum ggt. He had MRI of his pelvis for follow up on pancreatic mass developed severe low back pain. This was treated with NSAIDs and tramadol and saw Dr. Nelva Bush for evaluation but symptoms had largely improved by that time. Additional imaging did not show severe spine disease. However hip pain predominantly on the right side has been ongoing usually while weightbearing standing or walking both. He has partial improvement in pain with current medications. Has not noticed any new leg swelling no new change in range or movement and does not have radicular symptoms. Sometimes hip pain extends around to the knee. He has no history of kidney disease, stones, and has never been on treatment for osteoporosis. 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 Abdominal small suspected cystic changes in abdomen thought to be benign. Blood tests negative for markers of lymphoma and myeloma. ? ?Labs reviewed ?Vit D wnl ?Vit B12 wnl ?PTH-rp neg ? ?Activities of Daily Living:  ?Patient reports morning stiffness for 5 minutes.   ?Patient Denies nocturnal pain.  ?Difficulty dressing/grooming: Denies ?Difficulty climbing stairs: Denies ?Difficulty getting out of chair: Reports ?Difficulty using hands for taps, buttons, cutlery, and/or writing: Denies ? ?Review of Systems  ?Constitutional:  Positive for fatigue.  ?HENT:  Positive for mouth dryness.    ?Eyes:  Negative for dryness.  ?Respiratory:  Negative for shortness of breath.   ?Cardiovascular:  Negative for swelling in legs/feet.  ?Gastrointestinal:  Negative for constipation.  ?Endocrine: Negative for excessive thirst.  ?Genitourinary:  Negative for difficulty urinating.  ?Musculoskeletal:  Positive for joint pain, gait problem, joint pain and morning stiffness.  ?Skin:  Negative for rash.  ?Allergic/Immunologic: Negative for susceptible to infections.  ?Neurological:  Negative for numbness.  ?Hematological:  Negative for bruising/bleeding tendency.  ?Psychiatric/Behavioral:  Negative for sleep disturbance.   ? ?PMFS History:  ?Patient Active Problem List  ? Diagnosis Date Noted  ? Diverticulosis of colon 01/30/2022  ? Malignant tumor of pancreas ([place]) 01/30/2022  ? Right upper quadrant pain 01/30/2022  ? Screening for malignant neoplasm of colon 01/30/2022  ? Weight loss 01/30/2022  ? 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 Pain in right hip 01/30/2022  ? Physical deconditioning 12/17/2020  ? OSA (obstructive sleep apnea) 12/08/2020  ? Hypotension 07/12/2020  ? Chronic systolic CHF (congestive heart failure) ([place]) 11/18/2019  ? NICM (nonischemic cardiomyopathy) ([place])   ?  ?Past Medical History:  ?Diagnosis Date  ? Hidradenitis   ? History of BPH   ? benign prostatic hypertrophy  ? History of hydrocele   ? in right testicle   ? Hypothyroidism   ? NICM (nonischemic cardiomyopathy) ([place])   ? OSA (obstructive sleep apnea) 12/08/2020  ? Severe OSA with an AHI of 47.3/hr with mild central sleep apnea with pAHIc of 6.7/hr and nocturnal hypoxemia with  a minimum O2 sat of 78% and < 88% for 15 minutes c/w nocturnal hypoxemia.   uses cpap nightly  ? S/P gastric bypass   ?  ?Family History  ?Problem Relation Age of Onset  ? Heart disease Father   ?     family hx; unsure what side   ? ?Past Surgical History:  ?Procedure Laterality Date  ? CARDIAC CATHETERIZATION  05/19/2020  ? CARDIOVASCULAR STRESS TEST    ? CARDIOLYTE ST  LATE 2000's AT [facility] OFFICE REPORTEDLY NEGATIVE FOR ISCHEMIA  ? ECHO DOPPLER COMPLETE(TRANSTHOR) ([facility] HX)    ? 11/2010 HT ECHO W/COLORFLOW/SPECTRAL ([facility] HISTORICAL RESULT) LVEF 35%, GLOBAL HK, MILD MR/TR/AR, 2013 STABLE LVEF 35% GLOBAL HK, NO CHANGE C/W 2012, LVEF 40% WITH GLOBAL HK; TRIVIAL MR/AR, NO EFFUSION  ? GASTRIC BYPASS    ? hx  ? HYDRADENITIS EXCISION Left 12/30/2020  ? Procedure: EXCISION HIDRADENITIS LEFT INNER THIGH;  Surgeon: Coralie Keens, MD;  Location: [place];  Service: General;  Laterality: Left;  ? HYDROCELE EXCISION  2012  ? REPAIR  ? RIGHT/LEFT HEART CATH AND CORONARY ANGIOGRAPHY N/A 05/19/2020  ? Procedure: RIGHT/LEFT HEART CATH AND CORONARY ANGIOGRAPHY;  Surgeon: Jettie Booze, MD;  Location: [place] [facility];  Service: Cardiovascular;  Laterality: N/A;  ? THYROIDECTOMY    ? ?Social History  ? ?Social History Narrative  ? Not on file  ? ?Immunization History  ?Administered Date(s) Administered  ? Influenza-Unspecified 08/06/2015  ? PFIZER Comirnaty(Gray Top)Covid-19 Tri-Sucrose Vaccine 01/12/2021  ? PFIZER(Purple Top)SARS-COV-2 Vaccination 11/01/2019, 11/22/2019  ? Zoster Recombinat (Shingrix) 03/26/2018  ?  ? ?Objective: ?Vital Signs: BP 133/82 (BP Location: Right Arm, Patient Position: Sitting, Cuff Size: Normal)   Pulse 66   Resp 16   Ht 5' 9.5" (1.765 m)   Wt 256 lb (116.1 kg)   BMI 37.26 kg/m?   ? ?Physical Exam ?Constitutional:   ?   Appearance: He is obese.  ?Eyes:  ?   Conjunctiva/sclera: Conjunctivae normal.  ?Cardiovascular:  ?   Rate and Rhythm: Normal rate and regular rhythm.  ?Pulmonary:  ?   Effort: Pulmonary effort is normal.  ?   Breath sounds: Normal breath sounds.  ?Musculoskeletal:  ?   Right lower leg: No edema.  ?   Left lower leg: No edema.  ?Skin: ?   General: Skin is warm and dry.  ?   Findings: No rash.  ?Neurological:  ?   Mental Status: He is alert.  ?   Deep Tendon Reflexes: Reflexes normal.  ?Psychiatric:     ?   Mood and Affect: Mood normal.  ?   ? ?Musculoskeletal Exam:  ?Elbows full ROM no tenderness or swelling ?Wrists full ROM no tenderness or swelling ?Fingers full ROM no tenderness or swelling ?No sacral or lateral hip tenderness to palpation, internal and external rotation appears mildly decreased but is symmetrical ?Knees full ROM no tenderness or swelling, some crepitus present no instability to varus or valgus pressure ? ? ?Investigation: ?No additional findings. ? ?Imaging: ?No results found. ? ?Recent Labs: ?Lab Results  ?Component Value Date  ? WBC 8.1 07/04/2020  ? HGB 12.5 (L) 07/04/2020  ? PLT 394 07/04/2020  ? NA 137 12/30/2020  ? K 4.3 12/30/2020  ? CL 104 12/30/2020  ? CO2 25 12/30/2020  ? GLUCOSE 149 (H) 12/30/2020  ? BUN 23 12/30/2020  ? CREATININE 1.21 12/30/2020  ? BILITOT 0.7 07/04/2020  ? ALKPHOS 92 07/04/2020  ? AST 22 07/04/2020  ? ALT  21 07/04/2020  ? PROT 6.5 07/04/2020  ? ALBUMIN 3.5 07/04/2020  ? CALCIUM 8.6 (L) 12/30/2020  ? GFRAA >60 07/04/2020  ? ? ?Speciality Comments: No specialty comments available. ? ?Procedures:  ?No procedures performed ?Allergies: Minocycline and Codeine  ? ?Assessment / Plan:     ?Visit Diagnoses: Paget disease of bone - Plan: BASIC METABOLIC PANEL WITH GFR, Calcium, Alkaline phosphatase, Other/Misc lab test ? ?Findings so far not completely definitive but would not recommend repeating recent MRI imaging and certainly not any biopsy of pagetic bone.  We will recheck labs today with alkaline phosphatase also checking serum N-telopeptide level.  Checking BMP and calcium anticipating starting IV bisphosphonate treatment.  He has a history of Roux-en-Y gastric bypass surgery so would definitely recommend intravenous route of medication. ? ?Chronic systolic CHF (congestive heart failure) ([place]) ?NICM (nonischemic cardiomyopathy) ([place]) ? ?Currently taking midodrine and Coreg for management of heart failure and orthostatic hypotension problems sees Dr. Haroldine Laws.  Not currently on any ACE inhibitor to  increased risk of NSAIDs renal impact. ? ?Pain in right hip ? ?Not sure to what extent hip pain is currently related to bony lesions of the pelvis or could be just coming more from hip osteoarthritis problems.  He has fa

## 2022-01-30 ENCOUNTER — Encounter: Payer: Self-pay | Admitting: Internal Medicine

## 2022-01-30 ENCOUNTER — Ambulatory Visit (INDEPENDENT_AMBULATORY_CARE_PROVIDER_SITE_OTHER): Payer: Medicare Other | Admitting: Internal Medicine

## 2022-01-30 ENCOUNTER — Other Ambulatory Visit: Payer: Self-pay | Admitting: Internal Medicine

## 2022-01-30 VITALS — BP 133/82 | HR 66 | Resp 16 | Ht 69.5 in | Wt 256.0 lb

## 2022-01-30 DIAGNOSIS — M889 Osteitis deformans of unspecified bone: Secondary | ICD-10-CM | POA: Diagnosis not present

## 2022-01-30 DIAGNOSIS — K5730 Diverticulosis of large intestine without perforation or abscess without bleeding: Secondary | ICD-10-CM | POA: Insufficient documentation

## 2022-01-30 DIAGNOSIS — I5022 Chronic systolic (congestive) heart failure: Secondary | ICD-10-CM | POA: Diagnosis not present

## 2022-01-30 DIAGNOSIS — R634 Abnormal weight loss: Secondary | ICD-10-CM | POA: Insufficient documentation

## 2022-01-30 DIAGNOSIS — I428 Other cardiomyopathies: Secondary | ICD-10-CM | POA: Diagnosis not present

## 2022-01-30 DIAGNOSIS — K862 Cyst of pancreas: Secondary | ICD-10-CM | POA: Insufficient documentation

## 2022-01-30 DIAGNOSIS — R1011 Right upper quadrant pain: Secondary | ICD-10-CM | POA: Insufficient documentation

## 2022-01-30 DIAGNOSIS — Z1211 Encounter for screening for malignant neoplasm of colon: Secondary | ICD-10-CM | POA: Insufficient documentation

## 2022-01-30 DIAGNOSIS — M25551 Pain in right hip: Secondary | ICD-10-CM

## 2022-01-30 DIAGNOSIS — C259 Malignant neoplasm of pancreas, unspecified: Secondary | ICD-10-CM | POA: Insufficient documentation

## 2022-01-31 ENCOUNTER — Encounter: Payer: Self-pay | Admitting: Internal Medicine

## 2022-02-02 ENCOUNTER — Encounter: Payer: Self-pay | Admitting: Internal Medicine

## 2022-02-07 ENCOUNTER — Encounter: Payer: Self-pay | Admitting: Internal Medicine

## 2022-02-15 ENCOUNTER — Encounter: Payer: Self-pay | Admitting: Internal Medicine

## 2022-02-17 ENCOUNTER — Encounter: Payer: Self-pay | Admitting: Internal Medicine

## 2022-02-17 LAB — BASIC METABOLIC PANEL WITH GFR
BUN/Creatinine Ratio: 22 (calc) (ref 6–22)
BUN: 28 mg/dL — ABNORMAL HIGH (ref 7–25)
CO2: 26 mmol/L (ref 20–32)
Calcium: 9.2 mg/dL (ref 8.6–10.3)
Chloride: 109 mmol/L (ref 98–110)
Creat: 1.3 mg/dL — ABNORMAL HIGH (ref 0.70–1.28)
Glucose, Bld: 130 mg/dL — ABNORMAL HIGH (ref 65–99)
Potassium: 5.5 mmol/L — ABNORMAL HIGH (ref 3.5–5.3)
Sodium: 143 mmol/L (ref 135–146)
eGFR: 57 mL/min/{1.73_m2} — ABNORMAL LOW (ref >=60)

## 2022-02-17 LAB — ALKALINE PHOSPHATASE: Alkaline phosphatase (APISO): 72 U/L (ref 35–144)

## 2022-02-17 LAB — N-TELOPEPTIDE, CROSS-LINKED, SERUM

## 2022-02-20 NOTE — Progress Notes (Signed)
? ?Office Visit Note ? ?Patient: Steven [place]ard             ?Date of Birth: 02/21/1946           ?MRN: [id number]             ?PCP: Pcp, No ?Referring: No ref. provider found ?Visit Date: 02/21/2022 ? ? ?Subjective:  ?Follow-up (Doing good) ? ? ?History of Present Illness: Steven Beard is a 76 y.o. male here for follow up for positive paget's disease of the bone. MRI evaluating back and hip pains showing mottling in pelvis. Labs with elevated alkaline phosphatase ranging 141-162 with normal serum ggt. Repeat alk phos at our visit was 72. Symptoms remain about the same still right hip pain most noticeable first thing after getting up or pain after prolonged walking that is relived with sitting or lying. ? ?Previous HPI ?01/30/2022 ? Steven Beard is a 76 y.o. 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 MRI evaluating back and hip pains showing mottling in pelvis. Labs with elevated alkaline phosphatase ranging 141-162 with normal serum ggt. He had MRI of his pelvis for follow up on pancreatic mass developed severe low back pain. This was treated with NSAIDs and tramadol and saw Dr. Nelva Bush for evaluation but symptoms had largely improved by that time. Additional imaging did not show severe spine disease. However hip pain predominantly on the right side has been ongoing usually while weightbearing standing or walking both. He has partial improvement in pain with current medications. Has not noticed any new leg swelling no new change in range or movement and does not have radicular symptoms. Sometimes hip pain extends around to the knee. He has no history of kidney disease, stones, and has never been on treatment for osteoporosis. 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 Abdominal small suspected cystic changes in abdomen thought to be benign. Blood tests negative for markers of lymphoma and myeloma. ?  ?Labs reviewed ?Vit D wnl ?Vit B12 wnl ?PTH-rp  neg ? ? ?Review of Systems  ?Constitutional:  Positive for fatigue.  ?HENT:  Positive for mouth dryness.   ?Eyes:  Negative for dryness.  ?Respiratory:  Negative for shortness of breath.   ?Cardiovascular:  Negative for swelling in legs/feet.  ?Gastrointestinal:  Negative for constipation.  ?Endocrine: Negative for excessive thirst.  ?Genitourinary:  Negative for difficulty urinating.  ?Musculoskeletal:  Positive for muscle weakness and morning stiffness.  ?Skin:  Negative for rash.  ?Allergic/Immunologic: Negative for susceptible to infections.  ?Neurological:  Negative for weakness.  ?Hematological:  Negative for bruising/bleeding tendency.  ?Psychiatric/Behavioral:  Positive for sleep disturbance.   ? ?PMFS History:  ?Patient Active Problem List  ? Diagnosis Date Noted  ? Diverticulosis of colon 01/30/2022  ? Cystic mass of pancreas 01/30/2022  ? Right upper quadrant pain 01/30/2022  ? Screening for malignant neoplasm of colon 01/30/2022  ? Weight loss 01/30/2022  ? 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 Pain in right hip 01/30/2022  ? Physical deconditioning 12/17/2020  ? OSA (obstructive sleep apnea) 12/08/2020  ? Hypotension 07/12/2020  ? Chronic systolic CHF (congestive heart failure) ([place]) 11/18/2019  ? NICM (nonischemic cardiomyopathy) ([place])   ?  ?Past Medical History:  ?Diagnosis Date  ? Hidradenitis   ? History of BPH   ? benign prostatic hypertrophy  ? History of hydrocele   ? in right testicle   ? Hypothyroidism   ? NICM (nonischemic cardiomyopathy) ([place])   ?  OSA (obstructive sleep apnea) 12/08/2020  ? Severe OSA with an AHI of 47.3/hr with mild central sleep apnea with pAHIc of 6.7/hr and nocturnal hypoxemia with a minimum O2 sat of 78% and < 88% for 15 minutes c/w nocturnal hypoxemia.   uses cpap nightly  ? S/P gastric bypass   ?  ?Family History  ?Problem Relation Age of Onset  ? Heart disease Father   ?     family hx; unsure what side   ? ?Past Surgical History:  ?Procedure Laterality Date  ? CARDIAC  CATHETERIZATION  05/19/2020  ? CARDIOVASCULAR STRESS TEST    ? CARDIOLYTE ST LATE 2000's AT [facility] OFFICE REPORTEDLY NEGATIVE FOR ISCHEMIA  ? ECHO DOPPLER COMPLETE(TRANSTHOR) ([facility] HX)    ? 11/2010 HT ECHO W/COLORFLOW/SPECTRAL ([facility] HISTORICAL RESULT) LVEF 35%, GLOBAL HK, MILD MR/TR/AR, 2013 STABLE LVEF 35% GLOBAL HK, NO CHANGE C/W 2012, LVEF 40% WITH GLOBAL HK; TRIVIAL MR/AR, NO EFFUSION  ? GASTRIC BYPASS    ? hx  ? HYDRADENITIS EXCISION Left 12/30/2020  ? Procedure: EXCISION HIDRADENITIS LEFT INNER THIGH;  Surgeon: Coralie Keens, MD;  Location: [facility];  Service: General;  Laterality: Left;  ? HYDROCELE EXCISION  2012  ? REPAIR  ? RIGHT/LEFT HEART CATH AND CORONARY ANGIOGRAPHY N/A 05/19/2020  ? Procedure: RIGHT/LEFT HEART CATH AND CORONARY ANGIOGRAPHY;  Surgeon: Jettie Booze, MD;  Location: [facility];  Service: Cardiovascular;  Laterality: N/A;  ? THYROIDECTOMY    ? ?Social History  ? ?Social History Narrative  ? Not on file  ? ?Immunization History  ?Administered Date(s) Administered  ? Influenza-Unspecified 08/06/2015  ? PFIZER Comirnaty(Gray Top)Covid-19 Tri-Sucrose Vaccine 01/12/2021  ? PFIZER(Purple Top)SARS-COV-2 Vaccination 11/01/2019, 11/22/2019  ? Zoster Recombinat (Shingrix) 03/26/2018  ?  ? ?Objective: ?Vital Signs: BP 112/71 (BP Location: Left Arm, Patient Position: Sitting, Cuff Size: Normal)   Pulse 94   Resp 16   Ht 5' 9.5" (1.765 m)   Wt 252 lb (114.3 kg)   BMI 36.68 kg/m?   ? ?Physical Exam ?Constitutional:   ?   Appearance: He is obese.  ?Skin: ?   General: Skin is warm and dry.  ?Neurological:  ?   Mental Status: He is alert.  ?Psychiatric:     ?   Mood and Affect: Mood normal.  ?  ? ?Musculoskeletal Exam:  ?No paraspinal tenderness to palpation over upper and lower back ?Hip normal internal and external rotation without pain, decreased right hip FABER ROM without reported pain ?Knees full ROM patellofemoral crepitus present no palpable effusion ? ? ?Investigation: ?No  additional findings. ? ?Imaging: ?No results found. ? ?Recent Labs: ?Lab Results  ?Component Value Date  ? WBC 8.1 07/04/2020  ? HGB 12.5 (L) 07/04/2020  ? PLT 394 07/04/2020  ? NA 143 01/30/2022  ? K 5.5 (H) 01/30/2022  ? CL 109 01/30/2022  ? CO2 26 01/30/2022  ? GLUCOSE 130 (H) 01/30/2022  ? BUN 28 (H) 01/30/2022  ? CREATININE 1.30 (H) 01/30/2022  ? BILITOT 0.7 07/04/2020  ? ALKPHOS 92 07/04/2020  ? AST 22 07/04/2020  ? ALT 21 07/04/2020  ? PROT 6.5 07/04/2020  ? ALBUMIN 3.5 07/04/2020  ? CALCIUM 9.2 01/30/2022  ? GFRAA >60 07/04/2020  ? ? ?Speciality Comments: No specialty comments available. ? ?Procedures:  ?No procedures performed ?Allergies: Minocycline and Codeine  ? ?Assessment / Plan:     ?Visit Diagnoses: Paget disease of bone ? ?Not certain about this diagnosis now with normalized alkaline phosphatase. Symptoms are stable.  He completed some needed dental work including tooth extraction. I think it is reasonable to just monitor at this time. We can follow up with repeat lab testing in 6 months and additional imaging if needed such as hip xray or bone scintigraphy if very concerned. ? ?Pain in right hip - meloxicam 7.5 mg daily as needed. ? ?May be having pain related to hip arthritis or some lateral hip pain. Agree with low dose daily NSAID for treatment currently controlling symptoms adequately. ? ?Chronic systolic CHF (congestive heart failure) ([place]) ?NICM (nonischemic cardiomyopathy) ([place]) - Currently taking midodrine and Coreg for management of heart failure and orthostatic hypotension problems sees Dr. Haroldine Laws.  ? ?He does not appear to be having any issues with blood pressure, fluid retention, or renal function on continued low dose meloxicam. ? ? ?Orders: ?No orders of the defined types were placed in this encounter. ? ?No orders of the defined types were placed in this encounter. ? ? ? ?Follow-Up Instructions: Return in about 6 months (around 08/24/2022) for ?Pagets/hip pain NSAIDs f/u  59mo. ? ? ?CCollier Salina MD ? ?Note - This record has been created using [employer]  ?Chart creation errors have been sought, but may not always  ?have been located. Such creation errors do not reflect on  ?the standard of med

## 2022-02-20 NOTE — Telephone Encounter (Signed)
I agree that with the one test cancelled and the alkaline phosphatase normal I cannot confirm paget's disease or if so would suggest against disease activity. We should probably schedule a follow up again to take another look we could recheck his lab marker and discuss other options such as imaging.

## 2022-02-21 ENCOUNTER — Encounter: Payer: Self-pay | Admitting: Internal Medicine

## 2022-02-21 ENCOUNTER — Ambulatory Visit (INDEPENDENT_AMBULATORY_CARE_PROVIDER_SITE_OTHER): Payer: Medicare Other | Admitting: Internal Medicine

## 2022-02-21 VITALS — BP 112/71 | HR 94 | Resp 16 | Ht 69.5 in | Wt 252.0 lb

## 2022-02-21 DIAGNOSIS — I5022 Chronic systolic (congestive) heart failure: Secondary | ICD-10-CM

## 2022-02-21 DIAGNOSIS — M25551 Pain in right hip: Secondary | ICD-10-CM

## 2022-02-21 DIAGNOSIS — M889 Osteitis deformans of unspecified bone: Secondary | ICD-10-CM

## 2022-02-21 DIAGNOSIS — I428 Other cardiomyopathies: Secondary | ICD-10-CM | POA: Diagnosis not present

## 2022-03-25 ENCOUNTER — Other Ambulatory Visit: Payer: Self-pay | Admitting: Cardiovascular Disease

## 2022-08-01 ENCOUNTER — Encounter: Payer: Self-pay | Admitting: Cardiovascular Disease

## 2022-08-01 NOTE — Progress Notes (Unsigned)
Cardiology Office Note:    Date:  08/02/2022   ID:  Steven Beard, DOB 1946/08/27, MRN [id number]  PCP:  Pcp, No  Cardiologist:  Steven Beard  Electrophysiologist:  None   Referring MD: No ref. provider found   Chief Complaint  Patient presents with   Congestive Heart Failure         Previous notes:    Steven Beard is a 76 y.o. male with a hx of nonischemic cardiomyopathy.  His ejection fraction is around 35% by echocardiogram in 2019.  He recently moved from [place] and is transferring cardiology care. We were asked to see him today for follow up of his CHF by Dr. Benetta Beard.   Also has borderline DM.   HbA1C is around 7 .   Able to do his normal activities.  He is not very active Does not exercise much at all .   Lets are very weak .  Has difficulty getting out of a chair without using his arms.   Has trouble when he stands for a while - gets weak, nauseated.   November 18, 2019:  Steven Beard is seen today for follow-up of his congestive heart failure.  He is quite weak.  His blood pressure is very low today. We stopped his Lisinopril and started Entresto at his last visit.  Takes his BP on occasion Typical 110-120 / 60-70  January 28, 2020:  Steven Beard is seen today for follow-up of his congestive heart failure.  He was seen several months ago for a follow-up after initiating Entresto.  His blood pressure was too low to uptitrate any of his medications at that time. Echo is unchanged from previous EF Is waking more , legs are getting stronger   We discussed CHF clinic We discussed possible ICD placement   May 11, 2020: [place] is seen today for follow-up of his congestive heart failure.  He has been on Entresto but despite that his ejection fraction has not changed.  We referred him to the advanced heart failure clinic.  His appointment is scheduled for September 30.  He is still not feeling well  - weakness and fatigue.   No CP ,  Chronic DOE especially waking up [place] myoview in May  2021 shows no ischemia or infarction   Low risk  lprimary MD is [place] in [place].   Sept. 21, 2021: Seen with wife , [place] is seen today for follow-up of his nonischemic cardiomyopathy.  He has an ejection fraction of 25 to 30%.  We have tried him on Entresto but this did not improve his LV function.  Recently, he has had profound hypotension and the [place] has been on hold.  Spironolactone is also on hold   Cardiac cath Aug. 11, 2021 showed minimal, nonobstructive coronary artery disease.  His LVEF was 25 to 35%.  LV end-diastolic pressure was normal.  His PA pressure was 40/17 with a mean PA pressure of 26.  Pulmonary capillary wedge pressure was 15.  Cardiac output is 7.8 L/min with a cardiac index of 3.4 L/min.  His primary MD Steven Bushman, MD) ( phone [phone])   wants to do an abdominal CT .  We will need to order  CRP and ESR are elevated, no night sweats.  Has lost lots of weight ( 20 lbs in 7 months ) , is not eating , has no appetite.  Lies in bed and sleeps a lot .  Is trying an antidepressant    December 17, 2020: [place]ard is seen for follow up of his CHF symptoms  He has been seen I CHF clinic  EF has improved to 44%. He was found to have orthostatic hypotension with probable autonomic dysfunction.  Improved with midodrine    cMRI 10/21: EF 44%, RV ok, very small area of subendocardial apical inferior LGE ? significance - no amyloid  - genetic testing to look for evidence of possible TTR cardiomyopathy was negative - MM panel negative, TSH and B12 unremarkable, no convincing evidence for adrenal insufficiency at this point Has just finished up with physical therapy. He has issues with orthostasis , is on midodrine   Oct. 25, 2023 [place] is seen for follow up of his CHF Has orthostatic hypotension Has been seen by the CHF clinic  Hx of orthostatic hypotension, is on Midodrine No sypmtoms of orthostasis since starting Midodrine   Having some back  issues Has seen Dr. Maxie Beard . Is scheduled for an epidural injection soon  His ALK phos has been elevated . Saw . Dr. Benjamine Beard  Not as much exercise recently .  Just got back from [facility]. [place] [employer] [place] [employer] is 254 lbs      Past Medical History:  Diagnosis Date   Hidradenitis    History of BPH    benign prostatic hypertrophy   History of hydrocele    in right testicle    Hypothyroidism    NICM (nonischemic cardiomyopathy) ([facility])    OSA (obstructive sleep apnea) 12/08/2020   Severe OSA with an AHI of 47.3/hr with mild central sleep apnea with pAHIc of 6.7/hr and nocturnal hypoxemia with a minimum O2 sat of 78% and < 88% for 15 minutes c/w nocturnal hypoxemia.   uses cpap nightly   S/P gastric bypass     Past Surgical History:  Procedure Laterality Date   CARDIAC CATHETERIZATION  05/19/2020   CARDIOVASCULAR STRESS TEST     CARDIOLYTE ST LATE 2000's AT [facility] OFFICE REPORTEDLY NEGATIVE FOR ISCHEMIA   ECHO DOPPLER COMPLETE(TRANSTHOR) ([facility] HX)     11/2010 HT ECHO W/COLORFLOW/SPECTRAL ([facility] HISTORICAL RESULT) LVEF 35%, GLOBAL HK, MILD MR/TR/AR, 2013 STABLE LVEF 35% GLOBAL HK, NO CHANGE C/W 2012, LVEF 40% WITH GLOBAL HK; TRIVIAL MR/AR, NO EFFUSION   GASTRIC BYPASS     hx   HYDRADENITIS EXCISION Left 12/30/2020   Procedure: EXCISION HIDRADENITIS LEFT INNER THIGH;  Surgeon: Coralie Keens, MD;  Location: [facility];  Service: General;  Laterality: Left;   HYDROCELE EXCISION  2012   REPAIR   RIGHT/LEFT HEART CATH AND CORONARY ANGIOGRAPHY N/A 05/19/2020   Procedure: RIGHT/LEFT HEART CATH AND CORONARY ANGIOGRAPHY;  Surgeon: Jettie Booze, MD;  Location: [facility];  Service: Cardiovascular;  Laterality: N/A;   THYROIDECTOMY      Current Medications: Current Meds  Medication Sig   ACCU-CHEK GUIDE test strip 1 (ONE) STRIP IN VITRO TWO TIMES DAILY   Accu-Chek Softclix Lancets lancets SMARTSIG:Topical   acetaminophen (TYLENOL) 500 MG tablet Take 1,000 mg by mouth every 6  (six) hours as needed for moderate pain or headache.   ALPRAZolam (XANAX) 1 MG tablet Take 0.5 mg by mouth at bedtime as needed for anxiety.    Ascorbic Acid (VITAMIN C PO) Take by mouth.   Blood Glucose Monitoring Suppl (ACCU-CHEK GUIDE ME) w/Device KIT 1 (ONE) KIT AS DIRECETD   buPROPion (WELLBUTRIN XL) 300 MG 24 hr tablet Take 300 mg by mouth daily.   Calcium Citrate-Vitamin D (CALCIUM CITRATE +  PO) Take by mouth.   carvedilol (COREG) 3.125 MG tablet TAKE 1 TABLET BY MOUTH  TWICE DAILY WITH A MEAL   cyanocobalamin (,VITAMIN B-12,) 1000 MCG/ML injection Inject 1,000 mcg into the muscle every 30 (thirty) days.   doxycycline (VIBRAMYCIN) 100 MG capsule PLEASE SEE ATTACHED FOR DETAILED DIRECTIONS   finasteride (PROSCAR) 5 MG tablet Take 5 mg by mouth at bedtime.   HYDROcodone-acetaminophen (NORCO/VICODIN) 5-325 MG tablet Take 1 tablet by mouth every 4 (four) hours as needed. (Patient taking differently: Take 1 tablet by mouth every 4 (four) hours as needed for moderate pain.)   levothyroxine (SYNTHROID, LEVOTHROID) 175 MCG tablet Take 175 mcg by mouth See admin instructions. Take Monday - Saturday   meloxicam (MOBIC) 7.5 MG tablet Take 7.5 mg by mouth daily.   metFORMIN (GLUCOPHAGE-XR) 500 MG 24 hr tablet 1,000 mg 2 (two) times daily.   midodrine (PROAMATINE) 2.5 MG tablet TAKE 1 TABLET BY MOUTH 3 TIMES  DAILY WITH MEALS   Multiple Vitamins-Minerals (PRESERVISION AREDS) TABS Take 1 tablet by mouth 2 (two) times daily.    tamsulosin (FLOMAX) 0.4 MG CAPS capsule Take 0.4 mg by mouth at bedtime.   traMADol (ULTRAM) 50 MG tablet Take 1 tablet (50 mg total) by mouth every 6 (six) hours as needed for moderate pain or severe pain.   VITAMIN D PO Take 1 capsule by mouth daily.     Allergies:   Minocycline and Codeine   Social History   Socioeconomic History   Marital status: Married    Spouse name: Not on file   Number of children: Not on file   Years of education: Not on file   Highest  education level: Not on file  Occupational History   Not on file  Tobacco Use   Smoking status: Never   Smokeless tobacco: Never  Vaping Use   Vaping Use: Never used  Substance and Sexual Activity   Alcohol use: Yes    Alcohol/week: 4.0 standard drinks of alcohol    Types: 4 Glasses of wine per week   Drug use: Never   Sexual activity: Not on file  Other Topics Concern   Not on file  Social History Narrative   Not on file   Social Determinants of Health   Financial Resource Strain: Not on file  Food Insecurity: Not on file  Transportation Needs: Not on file  Physical Activity: Not on file  Stress: Not on file  Social Connections: Not on file     Family History: The patient's family history includes Heart disease in his father.  ROS:   Please see the history of present illness.     All other systems reviewed and are negative.  EKGs/Labs/Other Studies Reviewed:    The following studies were reviewed today:     Recent Labs: 01/30/2022: BUN 28; Creat 1.30; Potassium 5.5; Sodium 143  Recent Lipid Panel No results found for: "CHOL", "TRIG", "HDL", "CHOLHDL", "VLDL", "LDLCALC", "LDLDIRECT"   Physical Exam: Blood pressure 128/80, pulse 65, height 5' 9.5" (1.765 m), weight 254 lb 9.6 oz (115.5 kg).     GEN:  Well nourished, well developed in no acute distress HEENT: Normal NECK: No JVD; No carotid bruits LYMPHATICS: No lymphadenopathy CARDIAC: RRR , no murmurs, rubs, gallops RESPIRATORY:  Clear to auscultation without rales, wheezing or rhonchi  ABDOMEN: Soft, non-tender, non-distended MUSCULOSKELETAL:  No edema; No deformity  SKIN: Warm and dry NEUROLOGIC:  Alert and oriented x 3   EKG: August 02, 2022: Normal  sinus rhythm at 65.  Left ventricular hypertrophy with QRS widening.  ASSESSMENT:    1. Chronic systolic CHF (congestive heart failure) ([place])   2. Orthostatic hypotension     PLAN:       1.  Chronic systolic congestive heart failure:       Doing well  EF is 44%.  Cont meds  We have been  cautious about adding additional CHF meds due to his hx of orthostasis   Has been seen by Dr. Haroldine Laws who has now turned him back over to general cards.      2.  Obesity:   advised some weight loss .    3.  Elevated ESR and CRP:        4. Anemia :managed by his primary   5.  OSA : per primary MD    Medication Adjustments/Labs and Tests Ordered: Current medicines are reviewed at length with the patient today.  Concerns regarding medicines are outlined above.  Orders Placed This Encounter  Procedures   EKG 12-Lead   No orders of the defined types were placed in this encounter.     Patient Instructions  Medication Instructions:  Your physician recommends that you continue on your current medications as directed. Please refer to the Current Medication list given to you today.  *If you need a refill on your cardiac medications before your next appointment, please call your pharmacy*   Follow-Up: At [facility], you and your health needs are our priority.  As part of our continuing mission to provide you with exceptional heart care, we have created designated Provider Care Teams.  These Care Teams include your primary Cardiologist (physician) and Advanced Practice Providers (APPs -  Physician Assistants and Nurse Practitioners) who all work together to provide you with the care you need, when you need it.  Your next appointment:   1 year(s)  The format for your next appointment:   In Person  Provider:   Mertie Moores, MD      Important Information About Sugar         Signed, Mertie Moores, MD  08/02/2022 9:27 AM    [place]

## 2022-08-02 ENCOUNTER — Ambulatory Visit: Payer: Medicare Other | Attending: Cardiovascular Disease | Admitting: Cardiovascular Disease

## 2022-08-02 ENCOUNTER — Encounter: Payer: Self-pay | Admitting: Cardiovascular Disease

## 2022-08-02 VITALS — BP 128/80 | HR 65 | Ht 69.5 in | Wt 254.6 lb

## 2022-08-02 DIAGNOSIS — I951 Orthostatic hypotension: Secondary | ICD-10-CM | POA: Diagnosis present

## 2022-08-02 DIAGNOSIS — I5022 Chronic systolic (congestive) heart failure: Secondary | ICD-10-CM

## 2022-08-02 NOTE — Patient Instructions (Signed)
Medication Instructions:  Your physician recommends that you continue on your current medications as directed. Please refer to the Current Medication list given to you today.  *If you need a refill on your cardiac medications before your next appointment, please call your pharmacy*  Follow-Up: At  [facility], you and your health needs are our priority.  As part of our continuing mission to provide you with exceptional heart care, we have created designated Provider Care Teams.  These Care Teams include your primary Cardiologist (physician) and Advanced Practice Providers (APPs -  Physician Assistants and Nurse Practitioners) who all work together to provide you with the care you need, when you need it.  Your next appointment:   1 year(s)  The format for your next appointment:   In Person  Provider:   Philip Nahser, MD     Important Information About Sugar       

## 2022-08-21 ENCOUNTER — Other Ambulatory Visit: Payer: Self-pay | Admitting: Cardiovascular Disease

## 2022-08-22 ENCOUNTER — Ambulatory Visit: Payer: Medicare Other | Admitting: Internal Medicine

## 2022-08-29 ENCOUNTER — Ambulatory Visit: Payer: Medicare Other | Attending: Internal Medicine | Admitting: Internal Medicine

## 2022-08-29 ENCOUNTER — Encounter: Payer: Self-pay | Admitting: Internal Medicine

## 2022-08-29 VITALS — BP 145/88 | HR 71 | Resp 17 | Ht 70.0 in | Wt 250.0 lb

## 2022-08-29 DIAGNOSIS — M889 Osteitis deformans of unspecified bone: Secondary | ICD-10-CM | POA: Diagnosis present

## 2022-08-29 NOTE — Progress Notes (Signed)
Office Visit Note  Patient: Steven Beard             Date of Birth: 1946/09/07           MRN: [id number]             PCP: Pcp, No Referring: No ref. provider found Visit Date: 08/29/2022   Subjective:  Follow-up (Doing good)   History of Present Illness: Steven Beard is a 76 y.o. 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  Since her last visit he had lumbar spine injections for pain management with a very good improvement in the low back pain and in the posterior hip pain.  Also had repeat laboratory monitoring with his PCP office with repeat alkaline phosphatase staying in normal range now.  Otherwise no new complications and overall the low back and hip pain has stayed significantly improved.   Previous HPI 02/2022 Steven Beard is a 76 y.o. male here for follow up for posible paget's disease of the bone. MRI evaluating back and hip pains showing mottling in pelvis. Labs with elevated alkaline phosphatase ranging 141-162 with normal serum ggt. Repeat alk phos at our visit was 72. Symptoms remain about the same still right hip pain most noticeable first thing after getting up or pain after prolonged walking that is relived with sitting or lying.   Previous HPI 01/30/2022 Steven Beard is a 76 y.o. male here for evaluation of possible paget's disease of the bone. MRI evaluating back and hip pains showing mottling in pelvis. Labs with elevated alkaline phosphatase ranging 141-162 with normal serum ggt. He had MRI of his pelvis for follow up on pancreatic mass developed severe low back pain. This was treated with NSAIDs and tramadol and saw Dr. Nelva Bush for evaluation but symptoms had largely improved by that time. Additional imaging did not show severe spine disease. However hip pain predominantly on the right side has been ongoing usually while weightbearing standing or walking both. He has partial improvement in pain with current medications. Has not  noticed any new leg swelling no new change in range or movement and does not have radicular symptoms. Sometimes hip pain extends around to the knee. He has no history of kidney disease, stones, and has never been on treatment for osteoporosis. 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 Abdominal small suspected cystic changes in abdomen thought to be benign. Blood tests negative for markers of lymphoma and myeloma.   Labs reviewed Vit D wnl Vit B12 wnl PTH-rp neg   Review of Systems  Constitutional:  Negative for fatigue.  HENT:  Negative for mouth sores and mouth dryness.   Eyes:  Negative for dryness.  Respiratory:  Negative for shortness of breath.   Cardiovascular:  Negative for chest pain and palpitations.  Gastrointestinal:  Negative for blood in stool, constipation and diarrhea.  Endocrine: Negative for increased urination.  Genitourinary:  Negative for involuntary urination.  Musculoskeletal:  Positive for morning stiffness. Negative for joint pain, gait problem, joint pain, joint swelling, myalgias, muscle weakness, muscle tenderness and myalgias.  Skin:  Negative for color change, rash, hair loss and sensitivity to sunlight.  Allergic/Immunologic: Negative for susceptible to infections.  Neurological:  Negative for dizziness and headaches.  Hematological:  Negative for swollen glands.  Psychiatric/Behavioral:  Negative for depressed mood and sleep disturbance. The patient is not nervous/anxious.     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   Pain in right hip 01/30/2022   Physical deconditioning 12/17/2020   OSA (obstructive sleep apnea) 12/08/2020   Hypotension 07/12/2020   Chronic  systolic CHF (congestive heart failure) ([place]) 11/18/2019   NICM (nonischemic cardiomyopathy) ([place])     Past Medical History:  Diagnosis Date   Hidradenitis    History of BPH    benign prostatic hypertrophy   History of hydrocele    in right testicle    Hypothyroidism    NICM (nonischemic cardiomyopathy) ([place])    OSA (obstructive sleep apnea) 12/08/2020   Severe OSA with an AHI of 47.3/hr with mild central sleep apnea with pAHIc of 6.7/hr and nocturnal hypoxemia with a minimum O2 sat of 78% and < 88% for 15 minutes c/w nocturnal hypoxemia.   uses cpap nightly   S/P gastric bypass     Family History  Problem Relation Age of Onset   Heart disease Father        family hx; unsure what side    Past Surgical History:  Procedure Laterality Date   CARDIAC CATHETERIZATION  05/19/2020   CARDIOVASCULAR STRESS TEST     CARDIOLYTE ST LATE 2000's AT [facility] OFFICE REPORTEDLY NEGATIVE FOR ISCHEMIA   ECHO DOPPLER COMPLETE(TRANSTHOR) ([place] HX)     11/2010 HT ECHO W/COLORFLOW/SPECTRAL ([facility] HISTORICAL RESULT) LVEF 35%, GLOBAL HK, MILD MR/TR/AR, 2013 STABLE LVEF 35% GLOBAL HK, NO CHANGE C/W 2012, LVEF 40% WITH GLOBAL HK; TRIVIAL MR/AR, NO EFFUSION   GASTRIC BYPASS     hx   HYDRADENITIS EXCISION Left 12/30/2020   Procedure: EXCISION HIDRADENITIS LEFT INNER THIGH;  Surgeon: Coralie Keens, MD;  Location: [place];  Service: General;  Laterality: Left;   HYDROCELE EXCISION  2012   REPAIR   RIGHT/LEFT HEART CATH AND CORONARY ANGIOGRAPHY N/A 05/19/2020   Procedure: RIGHT/LEFT HEART CATH AND CORONARY ANGIOGRAPHY;  Surgeon: Jettie Booze, MD;  Location: [place] [facility];  Service: Cardiovascular;  Laterality: N/A;   THYROIDECTOMY     Social History   Social History Narrative   Not on file   Immunization History  Administered Date(s) Administered   Influenza-Unspecified 08/06/2015   PFIZER Comirnaty(Gray Top)Covid-19 Tri-Sucrose Vaccine 01/12/2021   PFIZER(Purple Top)SARS-COV-2 Vaccination  11/01/2019, 11/22/2019   Zoster Recombinat (Shingrix) 03/26/2018     Objective: Vital Signs: BP (!) 145/88 (BP Location: Left Arm, Patient Position: Sitting, Cuff Size: Normal)   Pulse 71   Resp 17   Ht _0  (1.778 m)   Wt 250 lb (113.4 kg)   BMI 35.87 kg/m    Physical Exam Constitutional:      Appearance: He is obese.  Cardiovascular:     Rate and Rhythm: Normal rate and regular rhythm.  Pulmonary:     Effort: Pulmonary effort is normal.     Breath sounds: Normal breath sounds.  Skin:    General: Skin is warm and dry.  Neurological:     Mental Status: He is alert.     Gait: Gait normal.  Psychiatric:        Mood and Affect: Mood normal.      Musculoskeletal Exam:  Wrists full ROM no tenderness or swelling Fingers full ROM no tenderness or swelling Hip internal and external rotation slightly limited but  roughly symmetrical and nontender Knees full ROM no tenderness or swelling, patellofemoral crepitus present   Investigation: No additional findings.  Imaging: No results found.  Recent Labs: Lab Results  Component Value Date   WBC 8.1 07/04/2020   HGB 12.5 (L) 07/04/2020   PLT 394 07/04/2020   NA 143 01/30/2022   K 5.5 (H) 01/30/2022   CL 109 01/30/2022   CO2 26 01/30/2022   GLUCOSE 130 (H) 01/30/2022   BUN 28 (H) 01/30/2022   CREATININE 1.30 (H) 01/30/2022   BILITOT 0.7 07/04/2020   ALKPHOS 92 07/04/2020   AST 22 07/04/2020   ALT 21 07/04/2020   PROT 6.5 07/04/2020   ALBUMIN 3.5 07/04/2020   CALCIUM 9.2 01/30/2022   GFRAA >60 07/04/2020    Speciality Comments: No specialty comments available.  Procedures:  No procedures performed Allergies: Minocycline and Codeine   Assessment / Plan:     Visit Diagnoses: Paget disease of bone  This was a suspected diagnosis but not really confirmed on any subsequent testing.  Considering the amount of symptom improvement with treatment of his low back pain I think the pelvic bone abnormalities are less  likely the cause of his symptoms.  With normal alkaline phosphatase and minimal symptoms I do not recommend sending for further advanced imaging such as bone scan or repeating MRI imaging.  Discussed that this could represent a previously active disease that is now quiescent or may have never had this disease.  I do not think he needs any additional routine screening though if baseline lab monitoring with his PCP reveals elevations in alkaline phosphatase or concerns for severe bone pain again we could reassess.  Orders: No orders of the defined types were placed in this encounter.  No orders of the defined types were placed in this encounter.    Follow-Up Instructions: Return if symptoms worsen or fail to improve, for Paget's disease.   Collier Salina, MD  Note - This record has been created using [employer].  Chart creation errors have been sought, but may not always  have been located. Such creation errors do not reflect on  the standard of medical care.

## 2022-09-21 ENCOUNTER — Telehealth: Payer: Self-pay | Admitting: Cardiovascular Disease

## 2022-09-21 NOTE — Telephone Encounter (Signed)
For about the last week he has been experiencing a feeling in his chest that feels like when you get nervous or excited.  No chest pain or heaviness or tightness.  Two weeks ago got 2nd shot for spinal stenosis.  One day later he started to get fidgety and had that nervous/excited feeling at the same time.Evelina Bucy Xanax for this.  It was gone for couple days.  Now the feelings in chest have returned for the last week.  PCP recently increased thyroid medication.  He has felt this that fidgety feeling in the past w thyroid issues and feels this is different.  147/85, HR 55.  Yesterday 128/80.  Not irregular per spouse.    The sensation comes and goes but he cannot pinpoint when it occurs.  Does not wake him up at night.  He notices it when he bends over to do something at the floor he feels it when he straightens back up.     I adv that I would send to Dr. Acie Fredrickson to make aware and will call him with recommendations.

## 2022-09-21 NOTE — Telephone Encounter (Signed)
Pt c/o of Chest Pain: STAT if CP now or developed within 24 hours  1. Are you having CP right now? No   2. Are you experiencing any other symptoms (ex. SOB, nausea, vomiting, sweating)? No   3. How long have you been experiencing CP? About a week   4. Is your CP continuous or coming and going? Coming and going   5. Have you taken Nitroglycerin? No    Describes it as a jittery feeling. States this is something new for him, he's never experienced before. Patient states he sent Dr. Acie Fredrickson a my chart message this morning regarding it as well.

## 2022-09-22 NOTE — Telephone Encounter (Signed)
Reached back out to patient to get more detailed description of symptoms and determine if a monitor may be appropriate. Pt states that he has not had any symptoms at all since calling and states he feels like he made a big deal out of nothing. Has felt completely fine since. Advised for him to call us back if the symptoms recur and remain. Advised that it could have likely been his thyroid medication, but he seems to believe this is not so.

## 2023-01-07 NOTE — Progress Notes (Unsigned)
Virtual Visit via Video Note   This visit type was conducted due to national recommendations for restrictions regarding the COVID-19 Pandemic (e.g. social distancing) in an effort to limit this patient's exposure and mitigate transmission in our community.  Due to his co-morbid illnesses, this patient is at least at moderate risk for complications without adequate follow up.  This format is felt to be most appropriate for this patient at this time.  All issues noted in this document were discussed and addressed.  A limited physical exam was performed with this format.  Please refer to the patient's chart for his consent to telehealth for [facility]  Date:  01/08/2023   ID:  Steven Beard, DOB 1946/09/07, MRN [id number] The patient was identified using 2 identifiers.  Patient Location: Home Provider Location: Home Office   PCP:  Pcp, No   [facility] Providers Cardiologist:  Mertie Moores, MD Sleep Medicine:  Fransico Him, MD     Evaluation Performed:  Follow-Up Visit  Chief Complaint:  OSA  History of Present Illness:    Steven Beard is a 77 y.o. male with  a hx of NICM followed by Dr. Haroldine Laws, morbid obesity s/p gastric bypass, DM2 and chronic systolic CHF who is referred initially to me for evaluation for sleep apnea.   He did have OSA prior to gastric bypass and was on CPAP but have losing weight he stopped using his PAP and had not been checked since his surgery for residual apnea.  He had not been using a CPAP.     A home sleep study was ordered showing severe OSA with an AHI of 47.3/hr with mild central sleep apnea with pAHIc of 6.7/hr and nocturnal hypoxemia with a minimum O2 sat of 78% and < 88% for 15 minutes c/w nocturnal hypoxemia. He was started on auto CPAP.  He is doing well with his PAP device and thinks that he has gotten used to it.  He tolerates the full face mask and feels the pressure is adequate.  Since going on PAP he feels rested in the am and has no  significant daytime sleepiness.  He denies any significant mouth or nasal dryness or nasal congestion.  He does not think that he snores.    Past Medical History:  Diagnosis Date   Hidradenitis    History of BPH    benign prostatic hypertrophy   History of hydrocele    in right testicle    Hypothyroidism    NICM (nonischemic cardiomyopathy)    OSA (obstructive sleep apnea) 12/08/2020   Severe OSA with an AHI of 47.3/hr with mild central sleep apnea with pAHIc of 6.7/hr and nocturnal hypoxemia with a minimum O2 sat of 78% and < 88% for 15 minutes c/w nocturnal hypoxemia.   uses cpap nightly   S/P gastric bypass    Past Surgical History:  Procedure Laterality Date   CARDIAC CATHETERIZATION  05/19/2020   CARDIOVASCULAR STRESS TEST     CARDIOLYTE ST LATE 2000's AT [facility] OFFICE REPORTEDLY NEGATIVE FOR ISCHEMIA   ECHO DOPPLER COMPLETE(TRANSTHOR) ([facility] HX)     11/2010 HT ECHO W/COLORFLOW/SPECTRAL ([facility] HISTORICAL RESULT) LVEF 35%, GLOBAL HK, MILD MR/TR/AR, 2013 STABLE LVEF 35% GLOBAL HK, NO CHANGE C/W 2012, LVEF 40% WITH GLOBAL HK; TRIVIAL MR/AR, NO EFFUSION   GASTRIC BYPASS     hx   HYDRADENITIS EXCISION Left 12/30/2020   Procedure: EXCISION HIDRADENITIS LEFT INNER THIGH;  Surgeon: Coralie Keens, MD;  Location: [place];  Service: General;  Laterality: Left;   HYDROCELE EXCISION  2012   REPAIR   RIGHT/LEFT HEART CATH AND CORONARY ANGIOGRAPHY N/A 05/19/2020   Procedure: RIGHT/LEFT HEART CATH AND CORONARY ANGIOGRAPHY;  Surgeon: Jettie Booze, MD;  Location: [place] [facility];  Service: Cardiovascular;  Laterality: N/A;   THYROIDECTOMY       No outpatient medications have been marked as taking for the 01/08/23 encounter (Video Visit) with Sueanne Margarita, MD.     Allergies:   Minocycline and Codeine   Social History   Tobacco Use   Smoking status: Never    Passive exposure: Never   Smokeless tobacco: Never  Vaping Use   Vaping Use: Never used  Substance Use Topics   Alcohol  use: Yes    Alcohol/week: 7.0 standard drinks of alcohol    Types: 7 Glasses of wine per week   Drug use: Never     Family Hx: The patient's family history includes Heart disease in his father.  ROS:   Please see the history of present illness.     All other systems reviewed and are negative.   Prior CV studies:   The following studies were reviewed today:  PAP compliance download  Labs/Other Tests and Data Reviewed:    EKG:  No ECG reviewed.  Recent Labs: 01/30/2022: BUN 28; Creat 1.30; Potassium 5.5; Sodium 143   Recent Lipid Panel No results found for: "CHOL", "TRIG", "HDL", "CHOLHDL", "LDLCALC", "LDLDIRECT"  Wt Readings from Last 3 Encounters:  01/08/23 250 lb (113.4 kg)  08/29/22 250 lb (113.4 kg)  08/02/22 254 lb 9.6 oz (115.5 kg)     Risk Assessment/Calculations:          Objective:    Vital Signs:  BP 102/69   Pulse 66   Ht 5\' 11"  (1.803 m)   Wt 250 lb (113.4 kg)   SpO2 96%   BMI 34.87 kg/m   Well nourished, well developed male in no acute distress. Well appearing, alert and conversant, regular work of breathing,  good skin color  Eyes- anicteric mouth- oral mucosa is pink  neuro- grossly intact skin- no apparent rash or lesions or cyanosis  ASSESSMENT & PLAN:    OSA - The patient is tolerating PAP therapy well without any problems. The PAP download performed by his DME was personally reviewed and interpreted by me today and showed an AHI of 1.4/hr on auto CPAP from 4 to 20 cm H2O with 100% compliance in using more than 4 hours nightly.  The patient has been using and benefiting from PAP use and will continue to benefit from therapy.   Time:   Today, I have spent 10 minutes with the patient with telehealth technology discussing the above problems.     Medication Adjustments/Labs and Tests Ordered: Current medicines are reviewed at length with the patient today.  Concerns regarding medicines are outlined above.   Tests Ordered: No orders of  the defined types were placed in this encounter.   Medication Changes: No orders of the defined types were placed in this encounter.   Follow Up:  In Person in 1 year(s)  Signed, Fransico Him, MD  01/08/2023 8:11 AM    [name]

## 2023-01-08 ENCOUNTER — Encounter: Payer: Self-pay | Admitting: Cardiology

## 2023-01-08 ENCOUNTER — Telehealth: Payer: Self-pay | Admitting: *Deleted

## 2023-01-08 ENCOUNTER — Ambulatory Visit: Payer: Medicare Other | Attending: Cardiology | Admitting: Cardiology

## 2023-01-08 VITALS — BP 102/69 | HR 66 | Ht 71.0 in | Wt 250.0 lb

## 2023-01-08 DIAGNOSIS — G4733 Obstructive sleep apnea (adult) (pediatric): Secondary | ICD-10-CM | POA: Insufficient documentation

## 2023-01-08 NOTE — Patient Instructions (Signed)
Medication Instructions:  Your physician recommends that you continue on your current medications as directed. Please refer to the Current Medication list given to you today.  *If you need a refill on your cardiac medications before your next appointment, please call your pharmacy*  Follow-Up: At [facility], you and your health needs are our priority.  As part of our continuing mission to provide you with exceptional heart care, we have created designated Provider Care Teams.  These Care Teams include your primary Cardiologist (physician) and Advanced Practice Providers (APPs -  Physician Assistants and Nurse Practitioners) who all work together to provide you with the care you need, when you need it.  Your next appointment:   1 year(s)  Provider:   Dr. Turner   

## 2023-01-08 NOTE — Telephone Encounter (Signed)
-----   Message from Sueanne Margarita, MD sent at 01/07/2023  6:15 PM EDT ----- Regarding: RE: Monday's  D/L Normal AHI and compliance ----- Message ----- From: Freada Bergeron, CMA Sent: 01/05/2023   1:45 PM EDT To: Sueanne Margarita, MD Subject: Fransisca Kaufmann  D/L                                  [name] 12/06/2022 - 01/04/2023 DOB: 30-Nov-1945 Age: 77 years Compliance Report Usage 12/06/2022 - 01/04/2023 Usage days 30/30 days (100%) >= 4 hours 30 days (100%) < 4 hours 0 days (0%) Other 0 days (0%) Average usage (total days) 10 hours 35 minutes Average usage (days used) 10 hours 35 minutes Best 30 12/06/2022 - 01/04/2023 30/30 days (100%) Myles Gip Auto-CPAP [id number] Mode: Auto-CPAP Settings Collected: 03/06/2022 SN Assigned: 10/21/2020 - Present Ramp/Initial P (cmH2O): 4.0 Min Pressure (cmH2O): 4.0 Max Pressure (cmH2O): 20.0 Therapy Avg Pressure (cmH20) 6.8 Avg P95 (cmH20) 8.9 Avg Leak (L/min) 7.5 Avg High Leak Minutes 0 Avg AHI 1.4 Avg CAI 0.6 Avg AI 1.1 Avg HI 0.3

## 2023-01-08 NOTE — Telephone Encounter (Signed)
Patient understands his AHI showed normal. Pt is aware of normal results.

## 2023-04-02 ENCOUNTER — Encounter: Payer: Self-pay | Admitting: Cardiovascular Disease

## 2023-04-28 ENCOUNTER — Other Ambulatory Visit: Payer: Self-pay | Admitting: Cardiovascular Disease

## 2023-04-29 ENCOUNTER — Encounter: Payer: Self-pay | Admitting: Cardiovascular Disease

## 2023-04-29 DIAGNOSIS — Z950 Presence of cardiac pacemaker: Secondary | ICD-10-CM

## 2023-06-21 ENCOUNTER — Other Ambulatory Visit: Payer: Self-pay | Admitting: Cardiovascular Disease

## 2023-07-13 ENCOUNTER — Ambulatory Visit: Payer: Medicare Other | Admitting: Cardiovascular Disease

## 2023-07-26 ENCOUNTER — Telehealth: Payer: Self-pay | Admitting: Cardiology

## 2023-07-26 DIAGNOSIS — I428 Other cardiomyopathies: Secondary | ICD-10-CM

## 2023-07-26 DIAGNOSIS — I2699 Other pulmonary embolism without acute cor pulmonale: Secondary | ICD-10-CM

## 2023-07-26 DIAGNOSIS — I447 Left bundle-branch block, unspecified: Secondary | ICD-10-CM

## 2023-07-26 DIAGNOSIS — Z950 Presence of cardiac pacemaker: Secondary | ICD-10-CM

## 2023-07-26 NOTE — Telephone Encounter (Signed)
Returned call to patient states doing cardiac rehab twice weekly and is doing very good with it, but he's still in [place]. Requesting order to do cardiac rehab here as he is due to come back home. His EF is still approx. 20%. Upcoming OV with NA for hospital follow-up on 08/28/23, then our EP team here on 08/30/23. (Ischemic cardiomyopathy, BiV ICD). Routing to MD for approval for cardiac rehab order as there is such a backlog in this program.

## 2023-07-26 NOTE — Telephone Encounter (Signed)
Pt stated he'd like a callback from nurse. When asked what it was regarding he stated "I'll talk to her about that".

## 2023-07-27 NOTE — Telephone Encounter (Signed)
Received below from [facility] about cardiac rehab order: Chelsea Aus, RN  Lars Mage, RN Caller: Unspecified (Yesterday,  3:15 PM) Thank you for the heads up.  Will need to determine how many sessions have already been completed and whether he was in a traditional CR or intensive CR program that we offer. 36 is the cut off for insurance reimbursement. We are also three times a week.  I do wonder with the date of his procedure if he has finished phase 2 and may be in a maintenance program. If not,will need to update the diagnosis as the ones listed are not reimburseable by medicare plans.  Would chronic systolic heart failure be approp?  Need echo report showing EF less than or equal to 35%   I reviewed notes in CareEverywhere and I am unable to see the results of the echo. Will need 12 lead EKG for comparison to rhythm strips we do in CR.  Doesn't look like he will see [facility] until 11/19 - which may work for the pt timeline for moving to the area. It could be done with that visit. Presently we do not have a backlog and pts who wish to schedule are able to do so once they have completed their follow up appt or appts if they are surgical for the same or following week. Apreciate your thoughts Pharmacist, hospital, BSN  Sent pt MyChart message to see if he can get a copy of ECHO faxed here to office and a statement showing how many rehab treatments he has completed.

## 2023-07-27 NOTE — Telephone Encounter (Signed)
Nahser, Deloris Ping, MD  You46 minutes ago (8:28 AM)   Yes, I agree, Please order cardiac rehab .  PN   Order placed at this time. Pt made aware via MyChart.

## 2023-08-10 ENCOUNTER — Encounter (HOSPITAL_COMMUNITY): Payer: Self-pay

## 2023-08-10 ENCOUNTER — Telehealth (HOSPITAL_COMMUNITY): Payer: Self-pay | Admitting: *Deleted

## 2023-08-10 ENCOUNTER — Telehealth (HOSPITAL_COMMUNITY): Payer: Self-pay

## 2023-08-10 NOTE — Telephone Encounter (Signed)
Called patient to see if he was interested in participating in the cardiac Rehab Program. Patient stated yes. Patient will come in for orientation on 11/25@1030  and will attend the 1015 exercise class.  Pensions consultant.

## 2023-08-10 NOTE — Telephone Encounter (Signed)
Pt insurance is active and benefits verified through [employer] Co-pay 0, DED 240/240 met, out of pocket 0/0 met, co-insurance 20. no pre-authorization required. Passport, [id number] , REF# [id number]   Pt insurance is active and benefits verified through [facility] Co-pay 0, DED 0/0 met, out of pocket 0/0 met, co-insurance 0. no pre-authorization required.    How many CR sessions are covered? (36 visits for TCR, 72 visits for ICR)72 Is this a lifetime maximum or an annual maximum? lifetime Has the member used any of these services to date? no Is there a time limit (weeks/months) on start of program and/or program completion? no     Will contact patient to see if he is interested in the Cardiac Rehab Program. If interested, patient will need to complete follow up appt. Once completed, patient will be contacted for scheduling upon review by the RN Navigator.

## 2023-08-10 NOTE — Telephone Encounter (Signed)
Returned call to pt who left message on the departmental voicemail for Cardiac Rehab. Pt inquiring for scheduling for intensive cardiac rehab. Pt had completed 15 traditional cardiac rehab sessions in [place].  These records were previously sent and reviewed. Pt has upcoming appt with local cardiologist - Dr. Elease Hashimoto and new consult for EP - Dr. Nelly Laurence.  Reviewed with pt Pritikin model, will mail informational brochure to him for his review. Tentatively scheduled pt for orientation on 11/25 at 10:30 and he will exercise/ education in the 10:15 class. Planning to do two days a week.  Advised pt that he will need to complete the above pt satisfactorily.  Verbalized understanding. Alanson Aly, BSN Cardiac and Emergency planning/management officer

## 2023-08-26 ENCOUNTER — Encounter: Payer: Self-pay | Admitting: Cardiovascular Disease

## 2023-08-26 NOTE — Progress Notes (Unsigned)
Cardiology Office Note:    Date:  08/28/2023   ID:  Steven Beard, DOB 1945-11-18, MRN [id number]  PCP:  Pcp, No  Cardiologist:  Krystian Younglove  Electrophysiologist:  None   Referring MD: No ref. provider found   Chief Complaint  Patient presents with   Congestive Heart Failure    Previous notes:    Steven Beard is a 77 y.o. male with a hx of nonischemic cardiomyopathy.  His ejection fraction is around 35% by echocardiogram in 2019.  He recently moved from [place] and is transferring cardiology care. We were asked to see him today for follow up of his CHF by Dr. Maryagnes Amos.   Also has borderline DM.   HbA1C is around 7 .   Able to do his normal activities.  He is not very active Does not exercise much at all .   Lets are very weak .  Has difficulty getting out of a chair without using his arms.   Has trouble when he stands for a while - gets weak, nauseated.   November 18, 2019:  Steven Beard is seen today for follow-up of his congestive heart failure.  He is quite weak.  His blood pressure is very low today. We stopped his Lisinopril and started Entresto at his last visit.  Takes his BP on occasion Typical 110-120 / 60-70  January 28, 2020:  Steven Beard is seen today for follow-up of his congestive heart failure.  He was seen several months ago for a follow-up after initiating Entresto.  His blood pressure was too low to uptitrate any of his medications at that time. Echo is unchanged from previous EF Is waking more , legs are getting stronger   We discussed CHF clinic We discussed possible ICD placement   May 11, 2020: Steven Beard is seen today for follow-up of his congestive heart failure.  He has been on Entresto but despite that his ejection fraction has not changed.  We referred him to the advanced heart failure clinic.  His appointment is scheduled for September 30.  He is still not feeling well  - weakness and fatigue.   No CP ,  Chronic DOE especially waking up hills myoview in May 2021  shows no ischemia or infarction   Low risk  lprimary MD is Steven Beard in [place].   Sept. 21, 2021: Seen with wife , Steven Beard is seen today for follow-up of his nonischemic cardiomyopathy.  He has an ejection fraction of 25 to 30%.  We have tried him on Entresto but this did not improve his LV function.  Recently, he has had profound hypotension and the Steven Beard has been on hold.  Spironolactone is also on hold   Cardiac cath Aug. 11, 2021 showed minimal, nonobstructive coronary artery disease.  His LVEF was 25 to 35%.  LV end-diastolic pressure was normal.  His PA pressure was 40/17 with a mean PA pressure of 26.  Pulmonary capillary wedge pressure was 15.  Cardiac output is 7.8 L/min with a cardiac index of 3.4 L/min.  His primary MD Steven Guy, MD) ( phone [phone])   wants to do an abdominal CT .  We will need to order  CRP and ESR are elevated, no night sweats.  Has lost lots of weight ( 20 lbs in 7 months ) , is not eating , has no appetite.  Lies in bed and sleeps a lot .  Is trying an antidepressant    December 17, 2020: Steven Beard is seen  for follow up of his CHF symptoms  He has been seen I CHF clinic  EF has improved to 44%. He was found to have orthostatic hypotension with probable autonomic dysfunction.  Improved with midodrine    cMRI 10/21: EF 44%, RV ok, very small area of subendocardial apical inferior LGE ? significance - no amyloid  - genetic testing to look for evidence of possible TTR cardiomyopathy was negative - MM panel negative, TSH and B12 unremarkable, no convincing evidence for adrenal insufficiency at this point Has just finished up with physical therapy. He has issues with orthostasis , is on midodrine   Oct. 25, 2023 Steven Beard is seen for follow up of his CHF Has orthostatic hypotension Has been seen by the CHF clinic  Hx of orthostatic hypotension, is on Midodrine No sypmtoms of orthostasis since starting Midodrine   Having some back  issues Has seen Dr. Jillyn Hidden . Is scheduled for an epidural injection soon  His ALK phos has been elevated . Saw . Dr. Dimple Casey  Not as much exercise recently .  Just got back from [facility] [place]  [place] is 254 lbs   Nov. 19, 2024 Seen with wife , Steven Beard is seen for follow up of his CHF June / July 2024  -Presented with acute PE while in [place] , [place]  ( was visiting his summer place)  Had driven for 10 hours up to [facility] [place]    Had bilateral PE , EF had fallen significantly  New LBBB  Had an ICD implanted during the hospitalization  Follow up echo showed no significant improvement of his EF  Change for echocardiogram on July 19, 2023 reveals LVEF of less than 20%.  He has severely dilated left ventricle.  Right ventricular pressure is mildly reduced.  He has mild aortic insufficiency, mild mitral regurgitation, mild tricuspid regurgitation   He has continued to have some generalize weakness, orthostatic hypotension        Past Medical History:  Diagnosis Date   Hidradenitis    History of BPH    benign prostatic hypertrophy   History of hydrocele    in right testicle    Hypothyroidism    NICM (nonischemic cardiomyopathy) (HCC)    OSA (obstructive sleep apnea) 12/08/2020   Severe OSA with an AHI of 47.3/hr with mild central sleep apnea with pAHIc of 6.7/hr and nocturnal hypoxemia with a minimum O2 sat of 78% and < 88% for 15 minutes c/w nocturnal hypoxemia.   uses cpap nightly   S/P gastric bypass     Past Surgical History:  Procedure Laterality Date   CARDIAC CATHETERIZATION  05/19/2020   CARDIOVASCULAR STRESS TEST     CARDIOLYTE ST LATE 2000's AT [facility] OFFICE REPORTEDLY NEGATIVE FOR ISCHEMIA   ECHO DOPPLER COMPLETE(TRANSTHOR) ([facility] HX)     11/2010 HT ECHO W/COLORFLOW/SPECTRAL ([facility] HISTORICAL RESULT) LVEF 35%, GLOBAL HK, MILD MR/TR/AR, 2013 STABLE LVEF 35% GLOBAL HK, NO CHANGE C/W 2012, LVEF 40% WITH GLOBAL HK; TRIVIAL MR/AR, NO EFFUSION    GASTRIC BYPASS     hx   HYDRADENITIS EXCISION Left 12/30/2020   Procedure: EXCISION HIDRADENITIS LEFT INNER THIGH;  Surgeon: Abigail Miyamoto, MD;  Location: [facility] OR;  Service: General;  Laterality: Left;   HYDROCELE EXCISION  2012   REPAIR   RIGHT/LEFT HEART CATH AND CORONARY ANGIOGRAPHY N/A 05/19/2020   Procedure: RIGHT/LEFT HEART CATH AND CORONARY ANGIOGRAPHY;  Surgeon: Corky Crafts, MD;  Location: [facility] INVASIVE CV LAB;  Service: Cardiovascular;  Laterality: N/A;   THYROIDECTOMY      Current Medications: Current Meds  Medication Sig   ACCU-CHEK GUIDE test strip 1 (ONE) STRIP IN VITRO TWO TIMES DAILY   Accu-Chek Softclix Lancets lancets SMARTSIG:Topical   acetaminophen (TYLENOL) 500 MG tablet Take 1,000 mg by mouth every 6 (six) hours as needed for moderate pain or headache.   ALPRAZolam (XANAX) 1 MG tablet Take 0.5 mg by mouth at bedtime as needed for anxiety.    apixaban (ELIQUIS) 2.5 MG TABS tablet Take 2.5 mg by mouth 2 (two) times daily.   Ascorbic Acid (VITAMIN C PO) Take by mouth.   Blood Glucose Monitoring Suppl (ACCU-CHEK GUIDE ME) w/Device KIT 1 (ONE) KIT AS DIRECETD   buPROPion (WELLBUTRIN XL) 300 MG 24 hr tablet Take 300 mg by mouth daily.   Calcium Citrate-Vitamin D (CALCIUM CITRATE + PO) Take by mouth.   carvedilol (COREG) 3.125 MG tablet TAKE 1 TABLET BY MOUTH TWICE  DAILY WITH MEALS   cyanocobalamin (,VITAMIN B-12,) 1000 MCG/ML injection Inject 1,000 mcg into the muscle every 30 (thirty) days.   doxycycline (VIBRAMYCIN) 100 MG capsule PLEASE SEE ATTACHED FOR DETAILED DIRECTIONS   finasteride (PROSCAR) 5 MG tablet Take 5 mg by mouth at bedtime.   HYDROcodone-acetaminophen (NORCO/VICODIN) 5-325 MG tablet Take 1 tablet by mouth every 4 (four) hours as needed. (Patient taking differently: Take 1 tablet by mouth every 4 (four) hours as needed for moderate pain (pain score 4-6).)   JARDIANCE 10 MG TABS tablet Take 10 mg by mouth daily.   levothyroxine (SYNTHROID,  LEVOTHROID) 175 MCG tablet Take 175 mcg by mouth See admin instructions. Take Monday - Saturday   midodrine (PROAMATINE) 2.5 MG tablet Take 1 tablet (2.5 mg total) by mouth 3 (three) times daily with meals. Pt needs to keep upcoming appt in Oct for further refills   Multiple Vitamins-Minerals (PRESERVISION AREDS) TABS Take 1 tablet by mouth 2 (two) times daily.    Semaglutide (OZEMPIC, 0.25 OR 0.5 MG/DOSE, [place])    tamsulosin (FLOMAX) 0.4 MG CAPS capsule Take 0.4 mg by mouth at bedtime.   traMADol (ULTRAM) 50 MG tablet Take 1 tablet (50 mg total) by mouth every 6 (six) hours as needed for moderate pain or severe pain.   VITAMIN D PO Take 1 capsule by mouth daily.     Allergies:   Minocycline and Codeine   Social History   Socioeconomic History   Marital status: Married    Spouse name: Not on file   Number of children: Not on file   Years of education: Not on file   Highest education level: Not on file  Occupational History   Not on file  Tobacco Use   Smoking status: Never    Passive exposure: Never   Smokeless tobacco: Never  Vaping Use   Vaping status: Never Used  Substance and Sexual Activity   Alcohol use: Yes    Alcohol/week: 7.0 standard drinks of alcohol    Types: 7 Glasses of wine per week   Drug use: Never   Sexual activity: Not on file  Other Topics Concern   Not on file  Social History Narrative   Not on file   Social Determinants of Health   Financial Resource Strain: Low Risk  (04/03/2023)   Received from [place], [employer]    Do you have any trouble paying for your medications, or do you think you might in the future?: No  Does your family have trouble paying for medicine? (Household - for ages 0-17 years): Not on file  Food Insecurity: Unknown (04/03/2023)   Received from [place], [facility]   Hunger Vital Sign    Worried About Programme researcher, broadcasting/film/video in the Last Year: Never true    Ran Out of Food in the Last Year: Not on file   Transportation Needs: No Transportation Needs (04/03/2023)   Received from [employer] [employer] Needs    Do you have trouble getting a ride to medical visits or work? (Adult - for ages 18 years and over): Not on file    Does your family have a hard time getting a ride to doctors' visits? (Household - for ages 0-17 years): Not on file    Has lack of transportation kept you from medical appointments, meetings, work, or from getting things needed for daily living? Check all that apply.: No    Do you (or your family) have trouble finding or paying for a ride (transportation)? (Household - for ages 0-17 years): Not on file  Physical Activity: Not on file  Stress: Not on file  Social Connections: Socially Integrated (04/03/2023)   Received from [place], [place]   Social Connections    How often do you feel lonely or isolated from those around you?: Rarely     Family History: The patient's family history includes Heart disease in his father.  ROS:   Please see the history of present illness.     All other systems reviewed and are negative.  EKGs/Labs/Other Studies Reviewed:    The following studies were reviewed today:     Recent Labs: No results found for requested labs within last 365 days.  Recent Lipid Panel No results found for: "CHOL", "TRIG", "HDL", "CHOLHDL", "VLDL", "LDLCALC", "LDLDIRECT"    Physical Exam: Blood pressure 102/62, pulse 96, height 5\' 11"  (1.803 m), weight 235 lb (106.6 kg), SpO2 93%.       GEN:  elderly male,  in no acute distress HEENT: Normal NECK: No JVD; No carotid bruits LYMPHATICS: No lymphadenopathy CARDIAC: RRR , no murmurs, rubs, gallops RESPIRATORY:  Clear to auscultation without rales, wheezing or rhonchi  ABDOMEN: Soft, non-tender, non-distended MUSCULOSKELETAL:  No edema; No deformity  SKIN: Warm and dry NEUROLOGIC:  Alert and oriented x 3   EKG:  EKG Interpretation Date/Time:  Tuesday August 28 2023 13:14:55  EST Ventricular Rate:  96 PR Interval:  130 QRS Duration:  112 QT Interval:  366 QTC Calculation: 462 R Axis:   -35  Text Interpretation: Atrial-sensed ventricular-paced rhythm When compared with ECG of 04-Jul-2020 10:56, Electronic ventricular pacemaker has replaced Sinus rhythm Confirmed by Kristeen Miss ([id number]) on 08/28/2023 2:04:06 PM     ASSESSMENT:    1. Follow-up exam   2. Acute pulmonary embolism without acute cor pulmonale, unspecified pulmonary embolism type (HCC)   3. Acute on chronic systolic heart failure (HCC)   4. Hypercoagulable state (HCC)      PLAN:       1.  Chronic systolic congestive heart failure:     Steven Beard has had progressive decline in his overall wellbeing since the spring.  When he went up to his summer place in [place] he had acute worsening shortness of breath.  He was found to have bilateral pulmonary emboli.  Echo also really revealed severe LV dysfunction with EF of less than 20%.  He had a new left bundle branch block.  He was started on Eliquis.  He had an biventricular pacer/ICD placed.  Follow-up echo several weeks later showed that the EF has not improved.  He was tried on low-dose Eliquis but this caused severe hypotension.  His blood pressure is too low to try any additional medications. Will have him follow-up in the advanced heart failure clinic.    2.  Acute bilateral pulmonary emboli: Steven Beard appears to have had an unprovoked DVT/pulmonary embolus.  He is on Eliquis currently.  He did have a trip to [facility] but I am not sure that this would be enough to have caused pulmonary emboli.  I would like for him to see hematology for further evaluation.       Medication Adjustments/Labs and Tests Ordered: Current medicines are reviewed at length with the patient today.  Concerns regarding medicines are outlined above.  Orders Placed This Encounter  Procedures   AMB referral to CHF clinic   Ambulatory  referral to Hematology / Oncology   EKG 12-Lead   No orders of the defined types were placed in this encounter.     Patient Instructions  Testing/Procedures: Ambulatory Referral to [facility] Organ for hypercoagulability workup  Ambulatory referral to Heart Failure (Dr Gala Romney)  Follow-Up: At [facility], you and your health needs are our priority.  As part of our continuing mission to provide you with exceptional heart care, we have created designated Provider Care Teams.  These Care Teams include your primary Cardiologist (physician) and Advanced Practice Providers (APPs -  Physician Assistants and Nurse Practitioners) who all work together to provide you with the care you need, when you need it.  Your next appointment:   3 month(s)  Provider:   Kristeen Miss, MD        Signed, Kristeen Miss, MD  08/28/2023 5:28 PM    [facility]

## 2023-08-28 ENCOUNTER — Encounter: Payer: Self-pay | Admitting: Cardiovascular Disease

## 2023-08-28 ENCOUNTER — Ambulatory Visit: Payer: Medicare Other | Attending: Cardiovascular Disease | Admitting: Cardiovascular Disease

## 2023-08-28 VITALS — BP 102/62 | HR 96 | Ht 71.0 in | Wt 235.0 lb

## 2023-08-28 DIAGNOSIS — I2699 Other pulmonary embolism without acute cor pulmonale: Secondary | ICD-10-CM | POA: Diagnosis not present

## 2023-08-28 DIAGNOSIS — Z09 Encounter for follow-up examination after completed treatment for conditions other than malignant neoplasm: Secondary | ICD-10-CM | POA: Diagnosis not present

## 2023-08-28 DIAGNOSIS — I5023 Acute on chronic systolic (congestive) heart failure: Secondary | ICD-10-CM | POA: Diagnosis not present

## 2023-08-28 DIAGNOSIS — D6859 Other primary thrombophilia: Secondary | ICD-10-CM

## 2023-08-28 NOTE — Patient Instructions (Signed)
Testing/Procedures: Ambulatory Referral to [facility] Organ for hypercoagulability workup  Ambulatory referral to Heart Failure (Dr Gala Romney)  Follow-Up: At [facility], you and your health needs are our priority.  As part of our continuing mission to provide you with exceptional heart care, we have created designated Provider Care Teams.  These Care Teams include your primary Cardiologist (physician) and Advanced Practice Providers (APPs -  Physician Assistants and Nurse Practitioners) who all work together to provide you with the care you need, when you need it.  Your next appointment:   3 month(s)  Provider:   Kristeen Miss, MD

## 2023-08-30 ENCOUNTER — Encounter: Payer: Self-pay | Admitting: Cardiovascular Disease

## 2023-08-30 ENCOUNTER — Ambulatory Visit: Payer: Medicare Other | Attending: Cardiovascular Disease | Admitting: Cardiovascular Disease

## 2023-08-30 ENCOUNTER — Telehealth (HOSPITAL_COMMUNITY): Payer: Self-pay

## 2023-08-30 VITALS — BP 120/66 | HR 74 | Ht 71.0 in | Wt 235.8 lb

## 2023-08-30 DIAGNOSIS — I5022 Chronic systolic (congestive) heart failure: Secondary | ICD-10-CM

## 2023-08-30 DIAGNOSIS — I5021 Acute systolic (congestive) heart failure: Secondary | ICD-10-CM | POA: Insufficient documentation

## 2023-08-30 DIAGNOSIS — I428 Other cardiomyopathies: Secondary | ICD-10-CM | POA: Diagnosis present

## 2023-08-30 DIAGNOSIS — Z95810 Presence of automatic (implantable) cardiac defibrillator: Secondary | ICD-10-CM

## 2023-08-30 NOTE — Patient Instructions (Signed)
Medication Instructions:  Your physician recommends that you continue on your current medications as directed. Please refer to the Current Medication list given to you today. *If you need a refill on your cardiac medications before your next appointment, please call your pharmacy*   Follow-Up: At [facility], you and your health needs are our priority.  As part of our continuing mission to provide you with exceptional heart care, we have created designated Provider Care Teams.  These Care Teams include your primary Cardiologist (physician) and Advanced Practice Providers (APPs -  Physician Assistants and Nurse Practitioners) who all work together to provide you with the care you need, when you need it.  We recommend signing up for the patient portal called "MyChart".  Sign up information is provided on this After Visit Summary.  MyChart is used to connect with patients for Virtual Visits (Telemedicine).  Patients are able to view lab/test results, encounter notes, upcoming appointments, etc.  Non-urgent messages can be sent to your provider as well.   To learn more about what you can do with MyChart, go to [email or web address].    Your next appointment:   As needed follow up  Provider:   York Pellant, MD

## 2023-08-30 NOTE — Progress Notes (Signed)
  Electrophysiology Office Note:    Date:  08/30/2023   ID:  Steven Beard, DOB [birth year], MRN [id number]  PCP:  Aviva Kluver   [facility] Providers Cardiologist:  Kristeen Miss, MD Electrophysiologist:  Maurice Small, MD  Sleep Medicine:  Armanda Magic, MD     Referring MD: Elease Hashimoto Deloris Ping, MD   History of Present Illness:    Steven Beard is a 77 y.o. male with a medical history significant for nonischemic cardiomyopathy (EF 35%), BiV-ICD, DM referred for device management.     I discussed the use of AI scribe software for clinical note transcription with the patient, who gave verbal consent to proceed.  In November 2024 he was admitted in [facility] with acute PE.  He was also noted to have exacerbation of heart failure and a new left bundle branch block.  BiV ICD was implanted during the hospitalization.   He has been following Dr. Elease Hashimoto for years.  He is device is followed by Dr. Arthur Holms in [place].      Today, he reports that he is at baseline and doing reasonably well. he has no device related complaints -- no new tenderness, drainage, redness.   EKGs/Labs/Other Studies Reviewed Today:       Advanced imaging:  Cardiac MRI October 2021 LVEF 44%.  Very small area of subendocardial apical inferior LGE.    EKG:       ECG from 11/19 reviewed - V-paced with LBBAP morphology   Physical Exam:    VS:  BP 120/66 (BP Location: Right Arm, Patient Position: Sitting, Cuff Size: Large)   Pulse 74   Ht 5\' 11"  (1.803 m)   Wt 235 lb 12.8 oz (107 kg)   SpO2 96%   BMI 32.89 kg/m     Wt Readings from Last 3 Encounters:  08/30/23 235 lb 12.8 oz (107 kg)  08/28/23 235 lb (106.6 kg)  01/08/23 250 lb (113.4 kg)     GEN: Well nourished, well developed in no acute distress CARDIAC: RRR, no murmurs, rubs, gallops The device site is normal -- no tenderness, edema, drainage, redness, threatened erosion.  RESPIRATORY:  Normal work of  breathing MUSCULOSKELETAL: no edema    ASSESSMENT & PLAN:     CRT ICD With left bundle branch lead QRS is narrow Nearly 100% V paced. He is not device dependent today Interrogation today was reviewed.  See Paceart for details  CHFrEF Appears euvolemic and compensated today Continue carvedilol 3.125, Jardiance 10 Medical therapy is limited due to hypotension  He is uncertain whether he wants to continue device follow-up here or in .  If he chooses to transfer monitoring here, I will see him annually.  Otherwise, I will be available for emergencies or any device needs while he is in [place].    Signed, Maurice Small, MD  08/30/2023 10:59 AM    [facility]

## 2023-08-30 NOTE — Telephone Encounter (Signed)
  Called pt to confirm appt for 09/03/23 at 1030. Gave pt instructions for appt, what to wear, office address, eating/taking meds before, and if sick to call and reschedule. Pt voiced understanding, all questions answered.   Health history completed? Yes   Jonna Coup, MS, ACSM-CEP 08/30/2023 3:58 PM

## 2023-09-03 ENCOUNTER — Encounter (HOSPITAL_COMMUNITY)
Admission: RE | Admit: 2023-09-03 | Discharge: 2023-09-03 | Disposition: A | Discharge status: Home / Self Care | Payer: Medicare Other | Source: Ambulatory Visit | Attending: Cardiovascular Disease | Admitting: Cardiovascular Disease

## 2023-09-03 VITALS — BP 96/58 | HR 85 | Ht 71.0 in | Wt 236.8 lb

## 2023-09-03 DIAGNOSIS — I5022 Chronic systolic (congestive) heart failure: Secondary | ICD-10-CM | POA: Diagnosis present

## 2023-09-03 DIAGNOSIS — I428 Other cardiomyopathies: Secondary | ICD-10-CM | POA: Diagnosis not present

## 2023-09-03 DIAGNOSIS — Z7901 Long term (current) use of anticoagulants: Secondary | ICD-10-CM | POA: Diagnosis not present

## 2023-09-03 LAB — GLUCOSE, CAPILLARY: Glucose-Capillary: 104 mg/dL — ABNORMAL HIGH (ref 70–99)

## 2023-09-03 NOTE — Progress Notes (Signed)
Cardiac Individual Treatment Plan  Patient Details  Name: Steven Beard MRN: [id number] Date of Birth: Dec 05, 1945 Referring Provider:   Flowsheet Row INTENSIVE CARDIAC REHAB ORIENT from 09/03/2023 in [facility]  Referring Provider Kristeen Miss, MD       Initial Encounter Date:  Flowsheet Row INTENSIVE CARDIAC REHAB ORIENT from 09/03/2023 in [facility] [place] [facility]  Date 09/03/23       Visit Diagnosis: Heart failure, chronic systolic (HCC)  Patient's Home Medications on Admission:  Current Outpatient Medications:    ACCU-CHEK GUIDE test strip, 1 (ONE) STRIP IN VITRO TWO TIMES DAILY, Disp: , Rfl:    Accu-Chek Softclix Lancets lancets, SMARTSIG:Topical, Disp: , Rfl:    acetaminophen (TYLENOL) 500 MG tablet, Take 1,000 mg by mouth every 6 (six) hours as needed for moderate pain or headache., Disp: , Rfl:    ALPRAZolam (XANAX) 1 MG tablet, Take 0.5 mg by mouth at bedtime as needed for anxiety. , Disp: , Rfl:    apixaban (ELIQUIS) 2.5 MG TABS tablet, Take 2.5 mg by mouth 2 (two) times daily., Disp: , Rfl:    Ascorbic Acid (VITAMIN C PO), Take by mouth., Disp: , Rfl:    Blood Glucose Monitoring Suppl (ACCU-CHEK GUIDE ME) w/Device KIT, 1 (ONE) KIT AS DIRECETD, Disp: , Rfl:    buPROPion (WELLBUTRIN XL) 300 MG 24 hr tablet, Take 300 mg by mouth daily., Disp: , Rfl:    Calcium Citrate-Vitamin D (CALCIUM CITRATE + PO), Take by mouth., Disp: , Rfl:    carvedilol (COREG) 3.125 MG tablet, TAKE 1 TABLET BY MOUTH TWICE  DAILY WITH MEALS, Disp: 180 tablet, Rfl: 0   cyanocobalamin (,VITAMIN B-12,) 1000 MCG/ML injection, Inject 1,000 mcg into the muscle every 30 (thirty) days., Disp: , Rfl:    finasteride (PROSCAR) 5 MG tablet, Take 5 mg by mouth at bedtime., Disp: , Rfl:    HYDROcodone-acetaminophen (NORCO/VICODIN) 5-325 MG tablet, Take 1 tablet by mouth every 4 (four) hours as needed.  (Patient taking differently: Take 1 tablet by mouth every 4 (four) hours as needed for moderate pain (pain score 4-6).), Disp: 10 tablet, Rfl: 0   JARDIANCE 10 MG TABS tablet, Take 10 mg by mouth daily., Disp: , Rfl:    levothyroxine (SYNTHROID, LEVOTHROID) 175 MCG tablet, Take 175 mcg by mouth See admin instructions. Take Monday - Saturday, Disp: , Rfl:    midodrine (PROAMATINE) 2.5 MG tablet, Take 1 tablet (2.5 mg total) by mouth 3 (three) times daily with meals. Pt needs to keep upcoming appt in Oct for further refills, Disp: 270 tablet, Rfl: 2   Multiple Vitamins-Minerals (PRESERVISION AREDS) TABS, Take 1 tablet by mouth 2 (two) times daily. , Disp: , Rfl:    OZEMPIC, 0.25 OR 0.5 MG/DOSE, 2 MG/3ML SOPN, Inject 0.5 mg into the skin once a week., Disp: , Rfl:    tamsulosin (FLOMAX) 0.4 MG CAPS capsule, Take 0.4 mg by mouth at bedtime., Disp: , Rfl:    traMADol (ULTRAM) 50 MG tablet, Take 1 tablet (50 mg total) by mouth every 6 (six) hours as needed for moderate pain or severe pain., Disp: 25 tablet, Rfl: 0   VITAMIN D PO, Take 1 capsule by mouth daily., Disp: , Rfl:    doxycycline (VIBRAMYCIN) 100 MG capsule, PLEASE SEE ATTACHED FOR DETAILED DIRECTIONS (Patient not taking: Reported on 09/03/2023), Disp: , Rfl:    meloxicam (MOBIC) 7.5 MG tablet, Take 7.5  mg by mouth daily. (Patient not taking: Reported on 08/29/2022), Disp: , Rfl:    metFORMIN (GLUCOPHAGE-XR) 500 MG 24 hr tablet, 1,000 mg 2 (two) times daily., Disp: , Rfl:   Past Medical History: Past Medical History:  Diagnosis Date   Hidradenitis    History of BPH    benign prostatic hypertrophy   History of hydrocele    in right testicle    Hypothyroidism    NICM (nonischemic cardiomyopathy) (HCC)    OSA (obstructive sleep apnea) 12/08/2020   Severe OSA with an AHI of 47.3/hr with mild central sleep apnea with pAHIc of 6.7/hr and nocturnal hypoxemia with a minimum O2 sat of 78% and < 88% for 15 minutes c/w nocturnal hypoxemia.   uses cpap  nightly   S/P gastric bypass     Tobacco Use: Social History   Tobacco Use  Smoking Status Never   Passive exposure: Never  Smokeless Tobacco Never    Labs: Review Flowsheet       Latest Ref Rng & Units 05/19/2020  Labs for ITP Cardiac and Pulmonary Rehab  PH, Arterial 7.350 - 7.450 7.383   PCO2 arterial 32.0 - 48.0 mmHg 42.6   Bicarbonate 20.0 - 28.0 mmol/L 26.9  25.3   TCO2 22 - 32 mmol/L 28  27   O2 Saturation % 66.0  90.0     Details       Multiple values from one day are sorted in reverse-chronological order         Capillary Blood Glucose: Lab Results  Component Value Date   GLUCAP 104 (H) 09/03/2023   GLUCAP 120 (H) 05/19/2020   GLUCAP 125 (H) 05/19/2020     Exercise Target Goals: Exercise Program Goal: Individual exercise prescription set using results from initial 6 min walk test and THRR while considering  patient's activity barriers and safety.   Exercise Prescription Goal: Initial exercise prescription builds to 30-45 minutes a day of aerobic activity, 2-3 days per week.  Home exercise guidelines will be given to patient during program as part of exercise prescription that the participant will acknowledge.  Activity Barriers & Risk Stratification:  Activity Barriers & Cardiac Risk Stratification - 09/03/23 1311       Activity Barriers & Cardiac Risk Stratification   Activity Barriers Deconditioning;Decreased Ventricular Function;Balance Concerns;Back Problems    Cardiac Risk Stratification High   <5 METs on            6 Minute Walk:  6 Minute Walk     Row Name 09/03/23 1310         6 Minute Walk   Phase Initial     Distance 600 feet     Walk Time 6 minutes     # of Rest Breaks 3  1:30-2:45, 3:15-4:00, 5:15-6:00 due to over THRR     MPH 1.14     METS 1.07     RPE 8     Perceived Dyspnea  0     VO2 Peak 3.73     Symptoms Yes (comment)     Comments 3 rest breaks due to exceeding THRR, no pain     Resting HR 85 bpm      Resting BP 96/58     Resting Oxygen Saturation  95 %     Exercise Oxygen Saturation  during 6 min walk 96 %     Max Ex. HR 119 bpm     Max Ex. BP 117/60     2  Minute Post BP 113/75              Oxygen Initial Assessment:   Oxygen Re-Evaluation:   Oxygen Discharge (Final Oxygen Re-Evaluation):   Initial Exercise Prescription:  Initial Exercise Prescription - 09/03/23 1300       Date of Initial Exercise RX and Referring Provider   Date 09/03/23    Referring Provider Kristeen Miss, MD    Expected Discharge Date 11/21/23      Recumbant Bike   Level 1    RPM 50    Watts 35    Minutes 15    METs 2      Track   Laps 13    Minutes 15    METs 2      Prescription Details   Frequency (times per week) 3    Duration Progress to 30 minutes of continuous aerobic without signs/symptoms of physical distress      Intensity   THRR 40-80% of Max Heartrate 57-114    Ratings of Perceived Exertion 11-13    Perceived Dyspnea 0-4      Progression   Progression Continue progressive overload as per policy without signs/symptoms or physical distress.      Resistance Training   Training Prescription Yes    Weight 3    Reps 10-15             Perform Capillary Blood Glucose checks as needed.  Exercise Prescription Changes:   Exercise Comments:   Exercise Goals and Review:   Exercise Goals     Row Name 09/03/23 1118             Exercise Goals   Increase Physical Activity Yes       Intervention Provide advice, education, support and counseling about physical activity/exercise needs.;Develop an individualized exercise prescription for aerobic and resistive training based on initial evaluation findings, risk stratification, comorbidities and participant's personal goals.       Expected Outcomes Short Term: Attend rehab on a regular basis to increase amount of physical activity.;Long Term: Exercising regularly at least 3-5 days a week.;Long Term: Add in home  exercise to make exercise part of routine and to increase amount of physical activity.       Increase Strength and Stamina Yes       Intervention Provide advice, education, support and counseling about physical activity/exercise needs.;Develop an individualized exercise prescription for aerobic and resistive training based on initial evaluation findings, risk stratification, comorbidities and participant's personal goals.       Expected Outcomes Short Term: Perform resistance training exercises routinely during rehab and add in resistance training at home;Long Term: Improve cardiorespiratory fitness, muscular endurance and strength as measured by increased METs and functional capacity ( );Short Term: Increase workloads from initial exercise prescription for resistance, speed, and METs.       Able to understand and use rate of perceived exertion (RPE) scale Yes       Intervention Provide education and explanation on how to use RPE scale       Expected Outcomes Short Term: Able to use RPE daily in rehab to express subjective intensity level;Long Term:  Able to use RPE to guide intensity level when exercising independently       Knowledge and understanding of Target Heart Rate Range (THRR) Yes       Intervention Provide education and explanation of THRR including how the numbers were predicted and where they are located for reference  Expected Outcomes Short Term: Able to state/look up THRR;Short Term: Able to use daily as guideline for intensity in rehab;Long Term: Able to use THRR to govern intensity when exercising independently       Understanding of Exercise Prescription Yes       Intervention Provide education, explanation, and written materials on patient's individual exercise prescription       Expected Outcomes Short Term: Able to explain program exercise prescription;Long Term: Able to explain home exercise prescription to exercise independently                Exercise Goals  Re-Evaluation :   Discharge Exercise Prescription (Final Exercise Prescription Changes):   Nutrition:  Target Goals: Understanding of nutrition guidelines, daily intake of sodium 1500mg , cholesterol 200mg , calories 30% from fat and 7% or less from saturated fats, daily to have 5 or more servings of fruits and vegetables.  Biometrics:  Pre Biometrics - 09/03/23 1121       Pre Biometrics   Waist Circumference 48 inches    Hip Circumference 53 inches    Waist to Hip Ratio 0.91 %    Triceps Skinfold 27 mm    % Body Fat 35.7 %    Grip Strength 30 kg    Flexibility 0 in   not done due to back problems   Single Leg Stand 1.5 seconds              Nutrition Therapy Plan and Nutrition Goals:   Nutrition Assessments:  MEDIFICTS Score Key: >=70 Need to make dietary changes  40-70 Heart Healthy Diet <= 40 Therapeutic Level Cholesterol Diet    Picture Your Plate Scores: <16 Unhealthy dietary pattern with much room for improvement. 41-50 Dietary pattern unlikely to meet recommendations for good health and room for improvement. 51-60 More healthful dietary pattern, with some room for improvement.  >60 Healthy dietary pattern, although there may be some specific behaviors that could be improved.    Nutrition Goals Re-Evaluation:   Nutrition Goals Re-Evaluation:   Nutrition Goals Discharge (Final Nutrition Goals Re-Evaluation):   Psychosocial: Target Goals: Acknowledge presence or absence of significant depression and/or stress, maximize coping skills, provide positive support system. Participant is able to verbalize types and ability to use techniques and skills needed for reducing stress and depression.  Initial Review & Psychosocial Screening:  Initial Psych Review & Screening - 09/03/23 1131       Initial Review   Current issues with Current Depression;Current Stress Concerns    Source of Stress Concerns Chronic Illness    Comments Steven Beard shared that he has had  some depression about his recent decline in heart function. He stated that he has been on Wellbutrin for quite a while, however doesn't feel it is working well. Encouraged to discuss with PCP. Steven Beard denies any need for additional resources, stating "at the end of the day, it's up to me to fix".      Family Dynamics   Good Support System? Yes   Steven Beard has his wife for support     Barriers   Psychosocial barriers to participate in program The patient should benefit from training in stress management and relaxation.      Screening Interventions   Interventions Provide feedback about the scores to participant;To provide support and resources with identified psychosocial needs;Encouraged to exercise    Expected Outcomes Short Term goal: Identification and review with participant of any Quality of Life or Depression concerns found by scoring the questionnaire.;Long Term Goal:  Stressors or current issues are controlled or eliminated.;Long Term goal: The participant improves quality of Life and PHQ9 Scores as seen by post scores and/or verbalization of changes             Quality of Life Scores:  Quality of Life - 09/03/23 1143       Quality of Life   Select Quality of Life      Quality of Life Scores   Health/Function Pre 20.07 %    Socioeconomic Pre 24.83 %    Psych/Spiritual Pre 21.86 %    Family Pre 30 %    GLOBAL Pre 22.82 %            Scores of 19 and below usually indicate a poorer quality of life in these areas.  A difference of  2-3 points is a clinically meaningful difference.  A difference of 2-3 points in the total score of the Quality of Life Index has been associated with significant improvement in overall quality of life, self-image, physical symptoms, and general health in studies assessing change in quality of life.  PHQ-9: Review Flowsheet       09/03/2023  Depression screen PHQ 2/9  Decreased Interest 1  Down, Depressed, Hopeless 1  PHQ - 2 Score 2  Altered  sleeping 1  Tired, decreased energy 3  Change in appetite 0  Feeling bad or failure about yourself  0  Trouble concentrating 0  Moving slowly or fidgety/restless 0  Suicidal thoughts 0  PHQ-9 Score 6  Difficult doing work/chores Not difficult at all    Details           Interpretation of Total Score  Total Score Depression Severity:  1-4 = Minimal depression, 5-9 = Mild depression, 10-14 = Moderate depression, 15-19 = Moderately severe depression, 20-27 = Severe depression   Psychosocial Evaluation and Intervention:   Psychosocial Re-Evaluation:   Psychosocial Discharge (Final Psychosocial Re-Evaluation):   Vocational Rehabilitation: Provide vocational rehab assistance to qualifying candidates.   Vocational Rehab Evaluation & Intervention:  Vocational Rehab - 09/03/23 1120       Initial Vocational Rehab Evaluation & Intervention   Assessment shows need for Vocational Rehabilitation No   Tailor is retired            Education: Education Goals: Education classes will be provided on a weekly basis, covering required topics. Participant will state understanding/return demonstration of topics presented.     Core Videos: Exercise    Move It!  Clinical staff conducted group or individual video education with verbal and written material and guidebook.  Patient learns the recommended Pritikin exercise program. Exercise with the goal of living a long, healthy life. Some of the health benefits of exercise include controlled diabetes, healthier blood pressure levels, improved cholesterol levels, improved heart and lung capacity, improved sleep, and better body composition. Everyone should speak with their doctor before starting or changing an exercise routine.  Biomechanical Limitations Clinical staff conducted group or individual video education with verbal and written material and guidebook.  Patient learns how biomechanical limitations can impact exercise and how we  can mitigate and possibly overcome limitations to have an impactful and balanced exercise routine.  Body Composition Clinical staff conducted group or individual video education with verbal and written material and guidebook.  Patient learns that body composition (ratio of muscle mass to fat mass) is a key component to assessing overall fitness, rather than body weight alone. Increased fat mass, especially visceral belly fat, can put  Steven Beard at increased risk for metabolic syndrome, type 2 diabetes, heart disease, and even death. It is recommended to combine diet and exercise (cardiovascular and resistance training) to improve your body composition. Seek guidance from your physician and exercise physiologist before implementing an exercise routine.  Exercise Action Plan Clinical staff conducted group or individual video education with verbal and written material and guidebook.  Patient learns the recommended strategies to achieve and enjoy long-term exercise adherence, including variety, self-motivation, self-efficacy, and positive decision making. Benefits of exercise include fitness, good health, weight management, more energy, better sleep, less stress, and overall well-being.  Medical   Heart Disease Risk Reduction Clinical staff conducted group or individual video education with verbal and written material and guidebook.  Patient learns our heart is our most vital organ as it circulates oxygen, nutrients, white blood cells, and hormones throughout the entire body, and carries waste away. Data supports a plant-based eating plan like the Pritikin Program for its effectiveness in slowing progression of and reversing heart disease. The video provides a number of recommendations to address heart disease.   Metabolic Syndrome and Belly Fat  Clinical staff conducted group or individual video education with verbal and written material and guidebook.  Patient learns what metabolic syndrome is, how it leads  to heart disease, and how one can reverse it and keep it from coming back. You have metabolic syndrome if you have 3 of the following 5 criteria: abdominal obesity, high blood pressure, high triglycerides, low HDL cholesterol, and high blood sugar.  Hypertension and Heart Disease Clinical staff conducted group or individual video education with verbal and written material and guidebook.  Patient learns that high blood pressure, or hypertension, is very common in the [place]. Hypertension is largely due to excessive salt intake, but other important risk factors include being overweight, physical inactivity, drinking too much alcohol, smoking, and not eating enough potassium from fruits and vegetables. High blood pressure is a leading risk factor for heart attack, stroke, congestive heart failure, dementia, kidney failure, and premature death. Long-term effects of excessive salt intake include stiffening of the arteries and thickening of heart muscle and organ damage. Recommendations include ways to reduce hypertension and the risk of heart disease.  Diseases of Our Time - Focusing on Diabetes Clinical staff conducted group or individual video education with verbal and written material and guidebook.  Patient learns why the best way to stop diseases of our time is prevention, through food and other lifestyle changes. Medicine (such as prescription pills and surgeries) is often only a Band-Aid on the problem, not a long-term solution. Most common diseases of our time include obesity, type 2 diabetes, hypertension, heart disease, and cancer. The Pritikin Program is recommended and has been proven to help reduce, reverse, and/or prevent the damaging effects of metabolic syndrome.  Nutrition   Overview of the Pritikin Eating Plan  Clinical staff conducted group or individual video education with verbal and written material and guidebook.  Patient learns about the Pritikin Eating Plan for disease risk  reduction. The Pritikin Eating Plan emphasizes a wide variety of unrefined, minimally-processed carbohydrates, like fruits, vegetables, whole grains, and legumes. Go, Caution, and Stop food choices are explained. Plant-based and lean animal proteins are emphasized. Rationale provided for low sodium intake for blood pressure control, low added sugars for blood sugar stabilization, and low added fats and oils for coronary artery disease risk reduction and weight management.  Calorie Density  Clinical staff conducted group or individual video education  with verbal and written material and guidebook.  Patient learns about calorie density and how it impacts the Pritikin Eating Plan. Knowing the characteristics of the food you choose will help you decide whether those foods will lead to weight gain or weight loss, and whether you want to consume more or less of them. Weight loss is usually a side effect of the Pritikin Eating Plan because of its focus on low calorie-dense foods.  Label Reading  Clinical staff conducted group or individual video education with verbal and written material and guidebook.  Patient learns about the Pritikin recommended label reading guidelines and corresponding recommendations regarding calorie density, added sugars, sodium content, and whole grains.  Dining Out - Part 1  Clinical staff conducted group or individual video education with verbal and written material and guidebook.  Patient learns that restaurant meals can be sabotaging because they can be so high in calories, fat, sodium, and/or sugar. Patient learns recommended strategies on how to positively address this and avoid unhealthy pitfalls.  Facts on Fats  Clinical staff conducted group or individual video education with verbal and written material and guidebook.  Patient learns that lifestyle modifications can be just as effective, if not more so, as many medications for lowering your risk of heart disease. A  Pritikin lifestyle can help to reduce your risk of inflammation and atherosclerosis (cholesterol build-up, or plaque, in the artery walls). Lifestyle interventions such as dietary choices and physical activity address the cause of atherosclerosis. A review of the types of fats and their impact on blood cholesterol levels, along with dietary recommendations to reduce fat intake is also included.  Nutrition Action Plan  Clinical staff conducted group or individual video education with verbal and written material and guidebook.  Patient learns how to incorporate Pritikin recommendations into their lifestyle. Recommendations include planning and keeping personal health goals in mind as an important part of their success.  Healthy Mind-Set    Healthy Minds, Bodies, Hearts  Clinical staff conducted group or individual video education with verbal and written material and guidebook.  Patient learns how to identify when they are stressed. Video will discuss the impact of that stress, as well as the many benefits of stress management. Patient will also be introduced to stress management techniques. The way we think, act, and feel has an impact on our hearts.  How Our Thoughts Can Heal Our Hearts  Clinical staff conducted group or individual video education with verbal and written material and guidebook.  Patient learns that negative thoughts can cause depression and anxiety. This can result in negative lifestyle behavior and serious health problems. Cognitive behavioral therapy is an effective method to help control our thoughts in order to change and improve our emotional outlook.  Additional Videos:  Exercise    Improving Performance  Clinical staff conducted group or individual video education with verbal and written material and guidebook.  Patient learns to use a non-linear approach by alternating intensity levels and lengths of time spent exercising to help burn more calories and lose more body fat.  Cardiovascular exercise helps improve heart health, metabolism, hormonal balance, blood sugar control, and recovery from fatigue. Resistance training improves strength, endurance, balance, coordination, reaction time, metabolism, and muscle mass. Flexibility exercise improves circulation, posture, and balance. Seek guidance from your physician and exercise physiologist before implementing an exercise routine and learn your capabilities and proper form for all exercise.  Introduction to Yoga  Clinical staff conducted group or individual video education with verbal and  written material and guidebook.  Patient learns about yoga, a discipline of the coming together of mind, breath, and body. The benefits of yoga include improved flexibility, improved range of motion, better posture and core strength, increased lung function, weight loss, and positive self-image. Yoga's heart health benefits include lowered blood pressure, healthier heart rate, decreased cholesterol and triglyceride levels, improved immune function, and reduced stress. Seek guidance from your physician and exercise physiologist before implementing an exercise routine and learn your capabilities and proper form for all exercise.  Medical   Aging: Enhancing Your Quality of Life  Clinical staff conducted group or individual video education with verbal and written material and guidebook.  Patient learns key strategies and recommendations to stay in good physical health and enhance quality of life, such as prevention strategies, having an advocate, securing a Health Care Proxy and Power of Attorney, and keeping a list of medications and system for tracking them. It also discusses how to avoid risk for bone loss.  Biology of Weight Control  Clinical staff conducted group or individual video education with verbal and written material and guidebook.  Patient learns that weight gain occurs because we consume more calories than we burn (eating more,  moving less). Even if your body weight is normal, you may have higher ratios of fat compared to muscle mass. Too much body fat puts you at increased risk for cardiovascular disease, heart attack, stroke, type 2 diabetes, and obesity-related cancers. In addition to exercise, following the Pritikin Eating Plan can help reduce your risk.  Decoding Lab Results  Clinical staff conducted group or individual video education with verbal and written material and guidebook.  Patient learns that lab test reflects one measurement whose values change over time and are influenced by many factors, including medication, stress, sleep, exercise, food, hydration, pre-existing medical conditions, and more. It is recommended to use the knowledge from this video to become more involved with your lab results and evaluate your numbers to speak with your doctor.   Diseases of Our Time - Overview  Clinical staff conducted group or individual video education with verbal and written material and guidebook.  Patient learns that according to the CDC, 50% to 70% of chronic diseases (such as obesity, type 2 diabetes, elevated lipids, hypertension, and heart disease) are avoidable through lifestyle improvements including healthier food choices, listening to satiety cues, and increased physical activity.  Sleep Disorders Clinical staff conducted group or individual video education with verbal and written material and guidebook.  Patient learns how good quality and duration of sleep are important to overall health and well-being. Patient also learns about sleep disorders and how they impact health along with recommendations to address them, including discussing with a physician.  Nutrition  Dining Out - Part 2 Clinical staff conducted group or individual video education with verbal and written material and guidebook.  Patient learns how to plan ahead and communicate in order to maximize their dining experience in a healthy and  nutritious manner. Included are recommended food choices based on the type of restaurant the patient is visiting.   Fueling a Banker conducted group or individual video education with verbal and written material and guidebook.  There is a strong connection between our food choices and our health. Diseases like obesity and type 2 diabetes are very prevalent and are in large-part due to lifestyle choices. The Pritikin Eating Plan provides plenty of food and hunger-curbing satisfaction. It is easy to follow, affordable, and helps  reduce health risks.  Menu Workshop  Clinical staff conducted group or individual video education with verbal and written material and guidebook.  Patient learns that restaurant meals can sabotage health goals because they are often packed with calories, fat, sodium, and sugar. Recommendations include strategies to plan ahead and to communicate with the manager, chef, or server to help order a healthier meal.  Planning Your Eating Strategy  Clinical staff conducted group or individual video education with verbal and written material and guidebook.  Patient learns about the Pritikin Eating Plan and its benefit of reducing the risk of disease. The Pritikin Eating Plan does not focus on calories. Instead, it emphasizes high-quality, nutrient-rich foods. By knowing the characteristics of the foods, we choose, we can determine their calorie density and make informed decisions.  Targeting Your Nutrition Priorities  Clinical staff conducted group or individual video education with verbal and written material and guidebook.  Patient learns that lifestyle habits have a tremendous impact on disease risk and progression. This video provides eating and physical activity recommendations based on your personal health goals, such as reducing LDL cholesterol, losing weight, preventing or controlling type 2 diabetes, and reducing high blood pressure.  Vitamins and  Minerals  Clinical staff conducted group or individual video education with verbal and written material and guidebook.  Patient learns different ways to obtain key vitamins and minerals, including through a recommended healthy diet. It is important to discuss all supplements you take with your doctor.   Healthy Mind-Set    Smoking Cessation  Clinical staff conducted group or individual video education with verbal and written material and guidebook.  Patient learns that cigarette smoking and tobacco addiction pose a serious health risk which affects millions of people. Stopping smoking will significantly reduce the risk of heart disease, lung disease, and many forms of cancer. Recommended strategies for quitting are covered, including working with your doctor to develop a successful plan.  Culinary   Becoming a Set designer conducted group or individual video education with verbal and written material and guidebook.  Patient learns that cooking at home can be healthy, cost-effective, quick, and puts them in control. Keys to cooking healthy recipes will include looking at your recipe, assessing your equipment needs, planning ahead, making it simple, choosing cost-effective seasonal ingredients, and limiting the use of added fats, salts, and sugars.  Cooking - Breakfast and Snacks  Clinical staff conducted group or individual video education with verbal and written material and guidebook.  Patient learns how important breakfast is to satiety and nutrition through the entire day. Recommendations include key foods to eat during breakfast to help stabilize blood sugar levels and to prevent overeating at meals later in the day. Planning ahead is also a key component.  Cooking - Educational psychologist conducted group or individual video education with verbal and written material and guidebook.  Patient learns eating strategies to improve overall health, including an approach  to cook more at home. Recommendations include thinking of animal protein as a side on your plate rather than center stage and focusing instead on lower calorie dense options like vegetables, fruits, whole grains, and plant-based proteins, such as beans. Making sauces in large quantities to freeze for later and leaving the skin on your vegetables are also recommended to maximize your experience.  Cooking - Healthy Salads and Dressing Clinical staff conducted group or individual video education with verbal and written material and guidebook.  Patient learns that vegetables, fruits,  whole grains, and legumes are the foundations of the Pritikin Eating Plan. Recommendations include how to incorporate each of these in flavorful and healthy salads, and how to create homemade salad dressings. Proper handling of ingredients is also covered. Cooking - Soups and [employer] - Soups and Desserts Clinical staff conducted group or individual video education with verbal and written material and guidebook.  Patient learns that Pritikin soups and desserts make for easy, nutritious, and delicious snacks and meal components that are low in sodium, fat, sugar, and calorie density, while high in vitamins, minerals, and filling fiber. Recommendations include simple and healthy ideas for soups and desserts.   Overview     The Pritikin Solution Program Overview Clinical staff conducted group or individual video education with verbal and written material and guidebook.  Patient learns that the results of the Pritikin Program have been documented in more than 100 articles published in peer-reviewed journals, and the benefits include reducing risk factors for (and, in some cases, even reversing) high cholesterol, high blood pressure, type 2 diabetes, obesity, and more! An overview of the three key pillars of the Pritikin Program will be covered: eating well, doing regular exercise, and having a healthy  mind-set.  WORKSHOPS  Exercise: Exercise Basics: Building Your Action Plan Clinical staff led group instruction and group discussion with PowerPoint presentation and patient guidebook. To enhance the learning environment the use of posters, models and videos may be added. At the conclusion of this workshop, patients will comprehend the difference between physical activity and exercise, as well as the benefits of incorporating both, into their routine. Patients will understand the FITT (Frequency, Intensity, Time, and Type) principle and how to use it to build an exercise action plan. In addition, safety concerns and other considerations for exercise and cardiac rehab will be addressed by the presenter. The purpose of this lesson is to promote a comprehensive and effective weekly exercise routine in order to improve patients' overall level of fitness.   Managing Heart Disease: Your Path to a Healthier Heart Clinical staff led group instruction and group discussion with PowerPoint presentation and patient guidebook. To enhance the learning environment the use of posters, models and videos may be added.At the conclusion of this workshop, patients will understand the anatomy and physiology of the heart. Additionally, they will understand how Pritikin's three pillars impact the risk factors, the progression, and the management of heart disease.  The purpose of this lesson is to provide a high-level overview of the heart, heart disease, and how the Pritikin lifestyle positively impacts risk factors.  Exercise Biomechanics Clinical staff led group instruction and group discussion with PowerPoint presentation and patient guidebook. To enhance the learning environment the use of posters, models and videos may be added. Patients will learn how the structural parts of their bodies function and how these functions impact their daily activities, movement, and exercise. Patients will learn how to promote a  neutral spine, learn how to manage pain, and identify ways to improve their physical movement in order to promote healthy living. The purpose of this lesson is to expose patients to common physical limitations that impact physical activity. Participants will learn practical ways to adapt and manage aches and pains, and to minimize their effect on regular exercise. Patients will learn how to maintain good posture while sitting, walking, and lifting.  Balance Training and Fall Prevention  Clinical staff led group instruction and group discussion with PowerPoint presentation and patient guidebook. To enhance the learning environment  the use of posters, models and videos may be added. At the conclusion of this workshop, patients will understand the importance of their sensorimotor skills (vision, proprioception, and the vestibular system) in maintaining their ability to balance as they age. Patients will apply a variety of balancing exercises that are appropriate for their current level of function. Patients will understand the common causes for poor balance, possible solutions to these problems, and ways to modify their physical environment in order to minimize their fall risk. The purpose of this lesson is to teach patients about the importance of maintaining balance as they age and ways to minimize their risk of falling.  WORKSHOPS   Nutrition:  Fueling a Ship broker led group instruction and group discussion with PowerPoint presentation and patient guidebook. To enhance the learning environment the use of posters, models and videos may be added. Patients will review the foundational principles of the Pritikin Eating Plan and understand what constitutes a serving size in each of the food groups. Patients will also learn Pritikin-friendly foods that are better choices when away from home and review make-ahead meal and snack options. Calorie density will be reviewed and applied to  three nutrition priorities: weight maintenance, weight loss, and weight gain. The purpose of this lesson is to reinforce (in a group setting) the key concepts around what patients are recommended to eat and how to apply these guidelines when away from home by planning and selecting Pritikin-friendly options. Patients will understand how calorie density may be adjusted for different weight management goals.  Mindful Eating  Clinical staff led group instruction and group discussion with PowerPoint presentation and patient guidebook. To enhance the learning environment the use of posters, models and videos may be added. Patients will briefly review the concepts of the Pritikin Eating Plan and the importance of low-calorie dense foods. The concept of mindful eating will be introduced as well as the importance of paying attention to internal hunger signals. Triggers for non-hunger eating and techniques for dealing with triggers will be explored. The purpose of this lesson is to provide patients with the opportunity to review the basic principles of the Pritikin Eating Plan, discuss the value of eating mindfully and how to measure internal cues of hunger and fullness using the Hunger Scale. Patients will also discuss reasons for non-hunger eating and learn strategies to use for controlling emotional eating.  Targeting Your Nutrition Priorities Clinical staff led group instruction and group discussion with PowerPoint presentation and patient guidebook. To enhance the learning environment the use of posters, models and videos may be added. Patients will learn how to determine their genetic susceptibility to disease by reviewing their family history. Patients will gain insight into the importance of diet as part of an overall healthy lifestyle in mitigating the impact of genetics and other environmental insults. The purpose of this lesson is to provide patients with the opportunity to assess their personal nutrition  priorities by looking at their family history, their own health history and current risk factors. Patients will also be able to discuss ways of prioritizing and modifying the Pritikin Eating Plan for their highest risk areas  Menu  Clinical staff led group instruction and group discussion with PowerPoint presentation and patient guidebook. To enhance the learning environment the use of posters, models and videos may be added. Using menus brought in from [employer], or printed from [employer], patients will apply the Pritikin dining out guidelines that were presented in the [employer]  video. Patients will also be able to practice these guidelines in a variety of provided scenarios. The purpose of this lesson is to provide patients with the opportunity to practice hands-on learning of the Pritikin Dining Out guidelines with actual menus and practice scenarios.  Label Reading Clinical staff led group instruction and group discussion with PowerPoint presentation and patient guidebook. To enhance the learning environment the use of posters, models and videos may be added. Patients will review and discuss the Pritikin label reading guidelines presented in Pritikin's Label Reading Educational series video. Using fool labels brought in from local grocery stores and markets, patients will apply the label reading guidelines and determine if the packaged food meet the Pritikin guidelines. The purpose of this lesson is to provide patients with the opportunity to review, discuss, and practice hands-on learning of the Pritikin Label Reading guidelines with actual packaged food labels. Cooking School  Pritikin's [employer] are designed to teach patients ways to prepare quick, simple, and affordable recipes at home. The importance of nutrition's role in chronic disease risk reduction is reflected in its emphasis in the overall Pritikin program. By learning how to prepare essential  core Pritikin Eating Plan recipes, patients will increase control over what they eat; be able to customize the flavor of foods without the use of added salt, sugar, or fat; and improve the quality of the food they consume. By learning a set of core recipes which are easily assembled, quickly prepared, and affordable, patients are more likely to prepare more healthy foods at home. These workshops focus on convenient breakfasts, simple entres, side dishes, and desserts which can be prepared with minimal effort and are consistent with nutrition recommendations for cardiovascular risk reduction. Cooking [employer] are taught by a Armed forces logistics/support/administrative officer (RD) who has been trained by the [employer]. The chef or RD has a clear understanding of the importance of minimizing - if not completely eliminating - added fat, sugar, and sodium in recipes. Throughout the series of Cooking School Workshop sessions, patients will learn about healthy ingredients and efficient methods of cooking to build confidence in their capability to prepare    Cooking School weekly topics:  Adding Flavor- Sodium-Free  Fast and Healthy Breakfasts  Powerhouse Plant-Based Proteins  Satisfying Salads and Dressings  Simple Sides and Sauces  International Cuisine-Spotlight on the [employer] Zones  Delicious Desserts  Savory Soups  [employer] - Meals in a Astronomer Appetizers and Snacks  Comforting Weekend Breakfasts  One-Pot Wonders   Fast Evening Meals  Landscape architect Your Pritikin Plate  WORKSHOPS   Healthy Mindset (Psychosocial):  Focused Goals, Sustainable Changes Clinical staff led group instruction and group discussion with PowerPoint presentation and patient guidebook. To enhance the learning environment the use of posters, models and videos may be added. Patients will be able to apply effective goal setting strategies to establish at least one personal goal, and then take  consistent, meaningful action toward that goal. They will learn to identify common barriers to achieving personal goals and develop strategies to overcome them. Patients will also gain an understanding of how our mind-set can impact our ability to achieve goals and the importance of cultivating a positive and growth-oriented mind-set. The purpose of this lesson is to provide patients with a deeper understanding of how to set and achieve personal goals, as well as the tools and strategies needed to overcome common obstacles which may arise along the way.  From [facility]  to Heart: The Power of a Healthy Outlook  Clinical staff led group instruction and group discussion with PowerPoint presentation and patient guidebook. To enhance the learning environment the use of posters, models and videos may be added. Patients will be able to recognize and describe the impact of emotions and mood on physical health. They will discover the importance of self-care and explore self-care practices which may work for them. Patients will also learn how to utilize the 4 C's to cultivate a healthier outlook and better manage stress and challenges. The purpose of this lesson is to demonstrate to patients how a healthy outlook is an essential part of maintaining good health, especially as they continue their cardiac rehab journey.  Healthy Sleep for a Healthy Heart Clinical staff led group instruction and group discussion with PowerPoint presentation and patient guidebook. To enhance the learning environment the use of posters, models and videos may be added. At the conclusion of this workshop, patients will be able to demonstrate knowledge of the importance of sleep to overall health, well-being, and quality of life. They will understand the symptoms of, and treatments for, common sleep disorders. Patients will also be able to identify daytime and nighttime behaviors which impact sleep, and they will be able to apply these tools to help  manage sleep-related challenges. The purpose of this lesson is to provide patients with a general overview of sleep and outline the importance of quality sleep. Patients will learn about a few of the most common sleep disorders. Patients will also be introduced to the concept of "sleep hygiene," and discover ways to self-manage certain sleeping problems through simple daily behavior changes. Finally, the workshop will motivate patients by clarifying the links between quality sleep and their goals of heart-healthy living.   Recognizing and Reducing Stress Clinical staff led group instruction and group discussion with PowerPoint presentation and patient guidebook. To enhance the learning environment the use of posters, models and videos may be added. At the conclusion of this workshop, patients will be able to understand the types of stress reactions, differentiate between acute and chronic stress, and recognize the impact that chronic stress has on their health. They will also be able to apply different coping mechanisms, such as reframing negative self-talk. Patients will have the opportunity to practice a variety of stress management techniques, such as deep abdominal breathing, progressive muscle relaxation, and/or guided imagery.  The purpose of this lesson is to educate patients on the role of stress in their lives and to provide healthy techniques for coping with it.  Learning Barriers/Preferences:  Learning Barriers/Preferences - 09/03/23 1119       Learning Barriers/Preferences   Learning Barriers Sight   wears glasses   Learning Preferences Audio;Group Instruction;Individual Instruction;Pictoral;Skilled Demonstration;Verbal Instruction;Written Material;Computer/Internet;Video             Education Topics:  Knowledge Questionnaire Score:  Knowledge Questionnaire Score - 09/03/23 1120       Knowledge Questionnaire Score   Pre Score 21/24             Core Components/Risk  Factors/Patient Goals at Admission:  Personal Goals and Risk Factors at Admission - 09/03/23 1120       Core Components/Risk Factors/Patient Goals on Admission    Weight Management Yes;Obesity;Weight Loss    Intervention Weight Management: Develop a combined nutrition and exercise program designed to reach desired caloric intake, while maintaining appropriate intake of nutrient and fiber, sodium and fats, and appropriate energy expenditure required for the weight goal.;Weight  Management/Obesity: Establish reasonable short term and long term weight goals.;Weight Management: Provide education and appropriate resources to help participant work on and attain dietary goals.;Obesity: Provide education and appropriate resources to help participant work on and attain dietary goals.    Expected Outcomes Short Term: Continue to assess and modify interventions until short term weight is achieved;Long Term: Adherence to nutrition and physical activity/exercise program aimed toward attainment of established weight goal;Understanding recommendations for meals to include 15-35% energy as protein, 25-35% energy from fat, 35-60% energy from carbohydrates, less than 200mg  of dietary cholesterol, 20-35 gm of total fiber daily;Weight Loss: Understanding of general recommendations for a balanced deficit meal plan, which promotes 1-2 lb weight loss per week and includes a negative energy balance of [phone] kcal/d;Understanding of distribution of calorie intake throughout the day with the consumption of 4-5 meals/snacks    Diabetes Yes    Intervention Provide education about signs/symptoms and action to take for hypo/hyperglycemia.;Provide education about proper nutrition, including hydration, and aerobic/resistive exercise prescription along with prescribed medications to achieve blood glucose in normal ranges: Fasting glucose 65-99 mg/dL    Expected Outcomes Short Term: Participant verbalizes understanding of the  signs/symptoms and immediate care of hyper/hypoglycemia, proper foot care and importance of medication, aerobic/resistive exercise and nutrition plan for blood glucose control.;Long Term: Attainment of HbA1C < 7%.    Heart Failure Yes    Intervention Provide a combined exercise and nutrition program that is supplemented with education, support and counseling about heart failure. Directed toward relieving symptoms such as shortness of breath, decreased exercise tolerance, and extremity edema.    Expected Outcomes Long term: Adoption of self-care skills and reduction of barriers for early signs and symptoms recognition and intervention leading to self-care maintenance.;Short term: Daily weights obtained and reported for increase. Utilizing diuretic protocols set by physician.;Short term: Attendance in program 2-3 days a week with increased exercise capacity. Reported lower sodium intake. Reported increased fruit and vegetable intake. Reports medication compliance.;Improve functional capacity of life    Hypertension Yes    Intervention Provide education on lifestyle modifcations including regular physical activity/exercise, weight management, moderate sodium restriction and increased consumption of fresh fruit, vegetables, and low fat dairy, alcohol moderation, and smoking cessation.;Monitor prescription use compliance.    Expected Outcomes Short Term: Continued assessment and intervention until BP is < 140/84mm HG in hypertensive participants. < 130/49mm HG in hypertensive participants with diabetes, heart failure or chronic kidney disease.;Long Term: Maintenance of blood pressure at goal levels.    Stress Yes    Intervention Offer individual and/or small group education and counseling on adjustment to heart disease, stress management and health-related lifestyle change. Teach and support self-help strategies.;Refer participants experiencing significant psychosocial distress to appropriate mental health  specialists for further evaluation and treatment. When possible, include family members and significant others in education/counseling sessions.    Expected Outcomes Short Term: Participant demonstrates changes in health-related behavior, relaxation and other stress management skills, ability to obtain effective social support, and compliance with psychotropic medications if prescribed.;Long Term: Emotional wellbeing is indicated by absence of clinically significant psychosocial distress or social isolation.             Core Components/Risk Factors/Patient Goals Review:    Core Components/Risk Factors/Patient Goals at Discharge (Final Review):    ITP Comments:  ITP Comments     Row Name 09/03/23 1115           ITP Comments Dr. Armanda Magic medical director. Introduction to [facility] education/intensive cardiac rehab.  Initial orientation packet reviewed with patient.                Comments: Participant attended orientation for the cardiac rehabilitation program on  09/03/2023  to perform initial intake and exercise walk test. Patient introduced to the [facility] education and orientation packet was reviewed. Completed 6-minute walk test, measurements, initial ITP, and exercise prescription. Vital signs stable. Telemetry-normal sinus rhythm, asymptomatic.   Service time was from 1029 to 1225.  Jonna Coup, MS, ACSM-CEP 09/03/2023 1:19 PM

## 2023-09-03 NOTE — Progress Notes (Signed)
Cardiac Rehab Medication Review   Does the patient  feel that his/her medications are working for him/her?  YES  Has the patient been experiencing any side effects to the medications prescribed?  YES  Does the patient measure his/her own blood pressure or blood glucose at home?  YES    Does the patient have any problems obtaining medications due to transportation or finances?   NO  Understanding of regimen: excellent Understanding of indications: excellent Potential of compliance: excellent    Comments: Righteous understands his medications and regime well. He does not regularly check his CBGs and occasionally checks his BP.    Jonna Coup, MS, ACSM-CEP 09/03/2023 11:17 AM

## 2023-09-10 ENCOUNTER — Encounter (HOSPITAL_COMMUNITY)
Admission: RE | Admit: 2023-09-10 | Discharge: 2023-09-10 | Disposition: A | Discharge status: Home / Self Care | Payer: Medicare Other | Source: Ambulatory Visit | Attending: Cardiovascular Disease | Admitting: Cardiovascular Disease

## 2023-09-10 DIAGNOSIS — Z5189 Encounter for other specified aftercare: Secondary | ICD-10-CM | POA: Diagnosis not present

## 2023-09-10 DIAGNOSIS — I5022 Chronic systolic (congestive) heart failure: Secondary | ICD-10-CM | POA: Diagnosis present

## 2023-09-10 LAB — GLUCOSE, CAPILLARY
Glucose-Capillary: 117 mg/dL — ABNORMAL HIGH (ref 70–99)
Glucose-Capillary: 108 mg/dL — ABNORMAL HIGH (ref 70–99)

## 2023-09-10 NOTE — Progress Notes (Signed)
Daily Session Note  Patient Details  Name: Steven Beard MRN: [id number] Date of Birth: 1946/08/08 Referring Provider:   Flowsheet Row INTENSIVE CARDIAC REHAB ORIENT from 09/03/2023 in [facility]  Referring Provider Kristeen Miss, MD       Encounter Date: 09/10/2023  Check In:  Session Check In - 09/10/23 1015       Check-In   Supervising physician immediately available to respond to emergencies [facility] MD immediately available    Physician(s) Eligha Bridegroom, NP    Location [facility]    Staff Present Raford Pitcher, MS, ACSM-CEP, Exercise Physiologist;Mary Bastin, RN, Marton Redwood, MS, ACSM-CEP, CCRP, Exercise Physiologist;Olinty Peggye Pitt, MS, ACSM-CEP, Exercise Physiologist;Ladarius Seubert, RN, BSN;Jetta Walker BS, ACSM-CEP, Exercise Physiologist    Virtual Visit No    Medication changes reported     No    Fall or balance concerns reported    No    Tobacco Cessation No Change    Warm-up and Cool-down Performed as group-led instruction    Resistance Training Performed Yes    VAD Patient? No    PAD/SET Patient? No      Pain Assessment   Currently in Pain? No/denies    Pain Score 0-No pain    Multiple Pain Sites No             Capillary Blood Glucose: Results for orders placed or performed during the hospital encounter of 09/10/23 (from the past 24 hour(s))  Glucose, capillary     Status: Abnormal   Collection Time: 09/10/23 10:30 AM  Result Value Ref Range   Glucose-Capillary 117 (H) 70 - 99 mg/dL  Glucose, capillary     Status: Abnormal   Collection Time: 09/10/23 11:29 AM  Result Value Ref Range   Glucose-Capillary 108 (H) 70 - 99 mg/dL     Exercise Prescription Changes - 09/10/23 1029       Response to Exercise   Blood Pressure (Admit) 102/68    Blood Pressure (Exercise) 112/66    Blood Pressure (Exit) 92/70    Heart Rate (Admit) 73 bpm    Heart Rate (Exercise) 130 bpm     Heart Rate (Exit) 78 bpm    Rating of Perceived Exertion (Exercise) 11    Symptoms None    Comments Elevated heart rate with walking. Moved to NuStep.    Duration Progress to 30 minutes of  aerobic without signs/symptoms of physical distress    Intensity THRR unchanged      Progression   Progression Continue to progress workloads to maintain intensity without signs/symptoms of physical distress.    Average METs 2      Resistance Training   Training Prescription Yes    Weight 3    Reps 10-15    Time 10 Minutes      Interval Training   Interval Training No      NuStep   Level 1    SPM 75    Minutes 20    METs 1.9      Track   Laps 4   880 ft   Minutes 7    METs 2.09             Social History   Tobacco Use  Smoking Status Never   Passive exposure: Never  Smokeless Tobacco Never    Goals Met:  Exercise tolerated well No report of concerns or symptoms today Strength training completed today  Goals Unmet:  Not Applicable  Comments: Pt started cardiac rehab today.  Pt tolerated light exercise without difficulty. VSS, telemetry-Sinus Rhythm, asymptomatic.  Medication list reconciled. Pt denies barriers to medicaiton compliance.  PSYCHOSOCIAL ASSESSMENT:  PHQ-7. Pt reports feeling depressed. Steven Beard says he is depressed regarding his heart health.  heart health. Will discuss PHQ2-9 in the upcoming week Pt enjoys spending time on the internet, sports. Spending time at his summer home in [place].   Pt oriented to exercise equipment and routine.  Crist is deconditioned. Exceeded target heart rate. Patient was switched to nustep.   Understanding verbalized.Thayer Headings RN BSN    Dr. Armanda Magic is Medical Director for Cardiac Rehab at [facility].

## 2023-09-11 ENCOUNTER — Encounter (HOSPITAL_COMMUNITY): Payer: Self-pay | Admitting: Cardiology

## 2023-09-11 ENCOUNTER — Ambulatory Visit (HOSPITAL_COMMUNITY)
Admission: RE | Admit: 2023-09-11 | Discharge: 2023-09-11 | Disposition: A | Discharge status: Home / Self Care | Payer: Medicare Other | Source: Ambulatory Visit | Attending: Cardiology | Admitting: Cardiology

## 2023-09-11 VITALS — BP 110/70 | HR 83 | Wt 234.6 lb

## 2023-09-11 DIAGNOSIS — I951 Orthostatic hypotension: Secondary | ICD-10-CM | POA: Insufficient documentation

## 2023-09-11 DIAGNOSIS — I2699 Other pulmonary embolism without acute cor pulmonale: Secondary | ICD-10-CM | POA: Insufficient documentation

## 2023-09-11 DIAGNOSIS — Z95810 Presence of automatic (implantable) cardiac defibrillator: Secondary | ICD-10-CM | POA: Diagnosis not present

## 2023-09-11 DIAGNOSIS — Z7901 Long term (current) use of anticoagulants: Secondary | ICD-10-CM | POA: Diagnosis not present

## 2023-09-11 DIAGNOSIS — E119 Type 2 diabetes mellitus without complications: Secondary | ICD-10-CM | POA: Insufficient documentation

## 2023-09-11 DIAGNOSIS — Z79899 Other long term (current) drug therapy: Secondary | ICD-10-CM | POA: Diagnosis not present

## 2023-09-11 DIAGNOSIS — G4733 Obstructive sleep apnea (adult) (pediatric): Secondary | ICD-10-CM | POA: Diagnosis not present

## 2023-09-11 DIAGNOSIS — I428 Other cardiomyopathies: Secondary | ICD-10-CM | POA: Insufficient documentation

## 2023-09-11 DIAGNOSIS — I5022 Chronic systolic (congestive) heart failure: Secondary | ICD-10-CM

## 2023-09-11 DIAGNOSIS — Z9884 Bariatric surgery status: Secondary | ICD-10-CM | POA: Insufficient documentation

## 2023-09-11 DIAGNOSIS — I5023 Acute on chronic systolic (congestive) heart failure: Secondary | ICD-10-CM | POA: Diagnosis present

## 2023-09-11 MED ORDER — MIDODRINE HCL 2.5 MG PO TABS: 5.0000 mg | ORAL_TABLET | Freq: Three times a day (TID) | ORAL | 2 refills | Status: DC

## 2023-09-11 NOTE — Progress Notes (Signed)
ADVANCED HEART FAILURE FOLLOW UP CLINIC NOTE  Referring Physician: Vesta Mixer, MD  Primary Care: Pcp, No Primary Cardiologist:  HPI: Steven Beard is a 77 y.o. 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      Previously followed by Steven Beard in [place] and moved to [place] and established with Steven Beard in 2017.   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 First told he had a low EF in 2005 after gastric bypass. Was referred to Steven Beard. Did not have a cath at that time. Moved to [place] in 2017.  He was originally followed by Steven Beard for congestive heart failure, his main symptoms being hypotension and fatigue.  Given his profound orthostatic hypotension he underwent workup for TTR amyloid which was unremarkable.  He was started on midodrine with improvement in his symptoms.  MRI showed ejection fraction 44% with minimal LGE and he overall has been doing well.  Patient was hospitalized for pulmonary embolism in 2024 and at that time was found to have a severely reduced ejection fraction along with new left bundle branch block.  A biventricular defibrillator was placed and he returned to [facility] for further follow-up.     SUBJECTIVE:  Patient reports that overall he has been doing well.  He is able to ambulate fairly well with frequent stops for shortness of breath.  He reports that he gets fatigued if he sits or stands for a long period of time.  He reports that his orthostatic hypotension symptoms have significantly improved on midodrine.  He has not felt much benefit since the biventricular ICD was implanted.  He is trying to be more active.  PMH, current medications, allergies, social history, and family history reviewed in epic.  PHYSICAL EXAM: Vitals:    09/11/23 1423  BP: 110/70  Pulse: 83  SpO2: 97%   GENERAL: Well nourished and in no apparent distress at rest.  HEENT: The mucous membranes are pink and moist.   PULM:  Normal work of breathing, clear to auscultation bilaterally. Respirations are unlabored.  CARDIAC:  JVP: Not Elevated         Normal rate with regular rhythm. No murmurs, rubs or gallops.  No lower extremity edema.  ABDOMEN: Soft, non-tender, non-distended. NEUROLOGIC: Patient is oriented x3 with no focal or lateralizing neurologic deficits.  PSYCH: Patients affect is appropriate, there is no evidence of anxiety or depression.  SKIN: Warm and dry; no lesions or wounds. Warm and well perfused extremities.  DATA REVIEW  ECG: Atrially sensed, biventricularly paced rhythm  ECHO: Echo 4/21: EF 30-35% RV ok.   Echo 2024: Reported from [place], evidently EF now 20 to 25% with moderate LV dilation  CATH: Cath 05/19/20: EF 25-35% Minimal nonobstructive CAD RA = 7 PA = 40/17 (26)  PCWP 15 CO/CI 7.8/3.4   cMRI 10/21: EF 44%, RV ok, very small area of subendocardial apical inferior LGE ? 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  Had been doing fairly well but was hospitalized in [place] with drop in ejection fraction and CRT-D placement.  He does report fatigue with mild exertion, euvolemic on exam and without concerning anginal symptoms so  left heart cath of limited utility.  Will proceed with CPX as patient otherwise has good renal function, support and would be a an LVAD candidate if significantly limited. - CPX to evaluate ongoing fatigue (bike) - Inability to tolerate GDMT, on midodrine - Continue carvedilol 3.125 mg twice daily and Jardiance 10 mg daily - Increase midodrine as below - Repeat echocardiogram at next visit while on BiV therapy  Orthostatic hypotension: Significantly improved on midodrine but still has dizziness and fatigued with standing or sitting for long periods of time.  Suspect largely due to poor vascular tone and venous pooling.  Orthostatic hypotension can be seen in around 5% of patients following bariatric surgery, workup for amyloid was negative. -Increase midodrine to 5 mg 3 times daily -Not on diuretics as above  Pulmonary embolism: Noted given worsening shortness of breath in [place].  On anticoagulation. -Continue apixaban, follow-up with hematology  Severe OSA - Sleep Study on 10/21 AHA 47.3/hr - awaiting CPAP - followed by Steven Beard  DM2 - Per PCP   Morbid obesity s/p gastric bypass    Steven Hasten, MD Advanced Heart Failure Mechanical Circulatory Support 09/12/23

## 2023-09-11 NOTE — Patient Instructions (Signed)
Good to see you today!  INCREASE Midodrine to 5 mg  3 x a day  Your physician has recommended that you have a cardiopulmonary stress test (CPX). CPX testing is a non-invasive measurement of heart and lung function. It replaces a traditional treadmill stress test. This type of test provides a tremendous amount of information that relates not only to your present condition but also for future outcomes. This test combines measurements of you ventilation, respiratory gas exchange in the lungs, electrocardiogram (EKG), blood pressure and physical response before, during, and following an exercise protocol.  We will call you to schedule this test  Your physician recommends that you schedule a follow-up appointment in: 2 months(February 2025 ) Call office in January to schedule an appointment  If you have any questions or concerns before your next appointment please send [place] a message through [place] or call our office at [phone].    TO LEAVE A MESSAGE FOR THE NURSE SELECT OPTION 2, PLEASE LEAVE A MESSAGE INCLUDING: YOUR NAME DATE OF BIRTH CALL BACK NUMBER REASON FOR CALL**this is important as we prioritize the call backs  YOU WILL RECEIVE A CALL BACK THE SAME DAY AS LONG AS YOU CALL BEFORE 4:00 PM  At the [facility], you and your health needs are our priority. As part of our continuing mission to provide you with exceptional heart care, we have created designated Provider Care Teams. These Care Teams include your primary Cardiologist (physician) and Advanced Practice Providers (APPs- Physician Assistants and Nurse Practitioners) who all work together to provide you with the care you need, when you need it.   You may see any of the following providers on your designated Care Team at your next follow up: Dr Arvilla Meres Dr Marca Ancona Dr. Dorthula Nettles Dr. Clearnce Hasten Amy Filbert Schilder, NP Robbie Lis, [place] [facility] [place], [place] Brynda Peon,  NP [place] Lee, NP Karle Plumber, PharmD   Please be sure to bring in all your medications bottles to every appointment.    Thank you for choosing [facility]

## 2023-09-12 ENCOUNTER — Telehealth (HOSPITAL_COMMUNITY): Payer: Self-pay | Admitting: *Deleted

## 2023-09-12 ENCOUNTER — Encounter (HOSPITAL_COMMUNITY)
Admission: RE | Admit: 2023-09-12 | Discharge: 2023-09-12 | Disposition: A | Discharge status: Home / Self Care | Payer: Medicare Other | Source: Ambulatory Visit | Attending: Cardiovascular Disease | Admitting: Cardiovascular Disease

## 2023-09-12 DIAGNOSIS — I5022 Chronic systolic (congestive) heart failure: Secondary | ICD-10-CM

## 2023-09-12 LAB — GLUCOSE, CAPILLARY
Glucose-Capillary: 109 mg/dL — ABNORMAL HIGH (ref 70–99)
Glucose-Capillary: 94 mg/dL (ref 70–99)

## 2023-09-12 NOTE — Telephone Encounter (Signed)
-----   Message from Romie Minus sent at 09/12/2023  8:30 AM EST ----- Elvina Sidle, sorry just saw him for the first time yesterday. Should be totally fine to increase his heart rate up to the 130s as long as he's doing well.   Thanks, Romeo Apple ----- Message ----- From: Cammy Copa, RN Sent: 09/11/2023   9:27 AM EST To: Romie Minus, MD  Good morning Dr Elwyn Lade,  Mr Siggins started cardiac rehab yesterday. Mr Birke exceeded his target heart rate of 114 yesterday. Highest heart rate was noted at 130 while walking on the walking track. Mr Ohlinger has an appointment with you this afternoon.   Can Mr Difranco increase his target heart rate to 129 for his age during exercise at cardiac rehab.  We appreciate your input!  Sincerely, Gladstone Lighter RN Cardiac Rehab

## 2023-09-14 ENCOUNTER — Encounter (HOSPITAL_COMMUNITY): Payer: Medicare Other

## 2023-09-17 ENCOUNTER — Encounter (HOSPITAL_COMMUNITY)
Admission: RE | Admit: 2023-09-17 | Discharge: 2023-09-17 | Disposition: A | Discharge status: Home / Self Care | Payer: Medicare Other | Source: Ambulatory Visit | Attending: Cardiovascular Disease | Admitting: Cardiovascular Disease

## 2023-09-17 DIAGNOSIS — I5022 Chronic systolic (congestive) heart failure: Secondary | ICD-10-CM | POA: Diagnosis not present

## 2023-09-18 NOTE — Progress Notes (Signed)
Cardiac Individual Treatment Plan  Patient Details  Name: Steven Beard MRN: [id number] Date of Birth: May 26, 1946 Referring Provider:   Flowsheet Row INTENSIVE CARDIAC REHAB ORIENT from 09/03/2023 in [facility]  Referring Provider Kristeen Miss, MD       Initial Encounter Date:  Flowsheet Row INTENSIVE CARDIAC REHAB ORIENT from 09/03/2023 in [facility]  Date 09/03/23       Visit Diagnosis: Heart failure, chronic systolic (HCC)  Patient's Home Medications on Admission:  Current Outpatient Medications:    ACCU-CHEK GUIDE test strip, 1 (ONE) STRIP IN VITRO TWO TIMES DAILY, Disp: , Rfl:    Accu-Chek Softclix Lancets lancets, SMARTSIG:Topical, Disp: , Rfl:    acetaminophen (TYLENOL) 500 MG tablet, Take 1,000 mg by mouth every 6 (six) hours as needed for moderate pain or headache., Disp: , Rfl:    ALPRAZolam (XANAX) 1 MG tablet, Take 1 mg by mouth at bedtime., Disp: , Rfl:    apixaban (ELIQUIS) 2.5 MG TABS tablet, Take 2.5 mg by mouth 2 (two) times daily., Disp: , Rfl:    Ascorbic Acid (VITAMIN C PO), Take by mouth., Disp: , Rfl:    Blood Glucose Monitoring Suppl (ACCU-CHEK GUIDE ME) w/Device KIT, 1 (ONE) KIT AS DIRECETD, Disp: , Rfl:    buPROPion (WELLBUTRIN XL) 300 MG 24 hr tablet, Take 300 mg by mouth daily., Disp: , Rfl:    Calcium Citrate-Vitamin D (CALCIUM CITRATE + PO), Take by mouth., Disp: , Rfl:    carvedilol (COREG) 3.125 MG tablet, TAKE 1 TABLET BY MOUTH TWICE  DAILY WITH MEALS, Disp: 180 tablet, Rfl: 0   cyanocobalamin (,VITAMIN B-12,) 1000 MCG/ML injection, Inject 1,000 mcg into the muscle every 30 (thirty) days., Disp: , Rfl:    finasteride (PROSCAR) 5 MG tablet, Take 5 mg by mouth at bedtime., Disp: , Rfl:    HYDROcodone-acetaminophen (NORCO/VICODIN) 5-325 MG tablet, Take 1 tablet by mouth every 4 (four) hours as needed., Disp: 10 tablet, Rfl: 0    JARDIANCE 10 MG TABS tablet, Take 10 mg by mouth daily., Disp: , Rfl:    levothyroxine (SYNTHROID, LEVOTHROID) 175 MCG tablet, Take 175 mcg by mouth See admin instructions. Take Monday - Saturday, Disp: , Rfl:    midodrine (PROAMATINE) 2.5 MG tablet, Take 2 tablets (5 mg total) by mouth 3 (three) times daily with meals. Pt needs to keep upcoming appt in Oct for further refills, Disp: 270 tablet, Rfl: 2   Multiple Vitamins-Minerals (PRESERVISION AREDS) TABS, Take 1 tablet by mouth 2 (two) times daily. , Disp: , Rfl:    OZEMPIC, 0.25 OR 0.5 MG/DOSE, 2 MG/3ML SOPN, Inject 0.5 mg into the skin once a week., Disp: , Rfl:    tamsulosin (FLOMAX) 0.4 MG CAPS capsule, Take 0.4 mg by mouth at bedtime., Disp: , Rfl:    traMADol (ULTRAM) 50 MG tablet, Take 1 tablet (50 mg total) by mouth every 6 (six) hours as needed for moderate pain or severe pain., Disp: 25 tablet, Rfl: 0   VITAMIN D PO, Take 1 capsule by mouth daily., Disp: , Rfl:   Past Medical History: Past Medical History:  Diagnosis Date   Hidradenitis    History of BPH    benign prostatic hypertrophy   History of hydrocele    in right testicle    Hypothyroidism    NICM (nonischemic cardiomyopathy) (HCC)    OSA (obstructive sleep apnea) 12/08/2020  Severe OSA with an AHI of 47.3/hr with mild central sleep apnea with pAHIc of 6.7/hr and nocturnal hypoxemia with a minimum O2 sat of 78% and < 88% for 15 minutes c/w nocturnal hypoxemia.   uses cpap nightly   S/P gastric bypass     Tobacco Use: Social History   Tobacco Use  Smoking Status Never   Passive exposure: Never  Smokeless Tobacco Never    Labs: Review Flowsheet       Latest Ref Rng & Units 05/19/2020  Labs for ITP Cardiac and Pulmonary Rehab  PH, Arterial 7.350 - 7.450 7.383   PCO2 arterial 32.0 - 48.0 mmHg 42.6   Bicarbonate 20.0 - 28.0 mmol/L 26.9  25.3   TCO2 22 - 32 mmol/L 28  27   O2 Saturation % 66.0  90.0     Details       Multiple values from one day are sorted  in reverse-chronological order         Capillary Blood Glucose: Lab Results  Component Value Date   GLUCAP 94 09/12/2023   GLUCAP 109 (H) 09/12/2023   GLUCAP 108 (H) 09/10/2023   GLUCAP 117 (H) 09/10/2023   GLUCAP 104 (H) 09/03/2023     Exercise Target Goals: Exercise Program Goal: Individual exercise prescription set using results from initial 6 min walk test and THRR while considering  patient's activity barriers and safety.   Exercise Prescription Goal: Initial exercise prescription builds to 30-45 minutes a day of aerobic activity, 2-3 days per week.  Home exercise guidelines will be given to patient during program as part of exercise prescription that the participant will acknowledge.  Activity Barriers & Risk Stratification:  Activity Barriers & Cardiac Risk Stratification - 09/03/23 1311       Activity Barriers & Cardiac Risk Stratification   Activity Barriers Deconditioning;Decreased Ventricular Function;Balance Concerns;Back Problems    Cardiac Risk Stratification High   <5 METs on            6 Minute Walk:  6 Minute Walk     Row Name 09/03/23 1310         6 Minute Walk   Phase Initial     Distance 600 feet     Walk Time 6 minutes     # of Rest Breaks 3  1:30-2:45, 3:15-4:00, 5:15-6:00 due to over THRR     MPH 1.14     METS 1.07     RPE 8     Perceived Dyspnea  0     VO2 Peak 3.73     Symptoms Yes (comment)     Comments 3 rest breaks due to exceeding THRR, no pain     Resting HR 85 bpm     Resting BP 96/58     Resting Oxygen Saturation  95 %     Exercise Oxygen Saturation  during 6 min walk 96 %     Max Ex. HR 119 bpm     Max Ex. BP 117/60     2 Minute Post BP 113/75              Oxygen Initial Assessment:   Oxygen Re-Evaluation:   Oxygen Discharge (Final Oxygen Re-Evaluation):   Initial Exercise Prescription:  Initial Exercise Prescription - 09/03/23 1300       Date of Initial Exercise RX and Referring Provider   Date  09/03/23    Referring Provider Kristeen Miss, MD    Expected Discharge Date 11/21/23  Recumbant Bike   Level 1    RPM 50    Watts 35    Minutes 15    METs 2      Track   Laps 13    Minutes 15    METs 2      Prescription Details   Frequency (times per week) 3    Duration Progress to 30 minutes of continuous aerobic without signs/symptoms of physical distress      Intensity   THRR 40-80% of Max Heartrate 57-114    Ratings of Perceived Exertion 11-13    Perceived Dyspnea 0-4      Progression   Progression Continue progressive overload as per policy without signs/symptoms or physical distress.      Resistance Training   Training Prescription Yes    Weight 3    Reps 10-15             Perform Capillary Blood Glucose checks as needed.  Exercise Prescription Changes:   Exercise Prescription Changes     Row Name 09/10/23 1029 09/12/23 1021           Response to Exercise   Blood Pressure (Admit) 102/68 108/66      Blood Pressure (Exercise) 112/66 118/62      Blood Pressure (Exit) 92/70 111/73      Heart Rate (Admit) 73 bpm 85 bpm      Heart Rate (Exercise) 130 bpm 107 bpm      Heart Rate (Exit) 78 bpm 78 bpm      Rating of Perceived Exertion (Exercise) 11 11      Symptoms None None      Comments Elevated heart rate with walking. Moved to NuStep. Moved to NuStep for 30 minutes. Modified warm-up to seated so as not to get too tired.      Duration Progress to 30 minutes of  aerobic without signs/symptoms of physical distress Progress to 30 minutes of  aerobic without signs/symptoms of physical distress      Intensity THRR unchanged THRR New  Up to 130 bpm.        Progression   Progression Continue to progress workloads to maintain intensity without signs/symptoms of physical distress. Continue to progress workloads to maintain intensity without signs/symptoms of physical distress.      Average METs 2 2.1        Resistance Training   Training Prescription Yes  No  relaxation day, no weights.      Weight 3 --      Reps 10-15 --      Time 10 Minutes --        Interval Training   Interval Training No No        NuStep   Level 1 1      SPM 75 93      Minutes 20 30      METs 1.9 2.1        Track   Laps 4  880 ft --      Minutes 7 --      METs 2.09 --               Exercise Comments:   Exercise Comments     Row Name 09/10/23 1136 09/12/23 1021         Exercise Comments Undra tolerated low intensity exercise fair without symptoms. Aubry's heart rate was elevated walking the track. Switched to the NuStep. Will continue to monitor. Switched patient to NuStep 30 minutes.  Exercise Goals and Review:   Exercise Goals     Row Name 09/03/23 1118             Exercise Goals   Increase Physical Activity Yes       Intervention Provide advice, education, support and counseling about physical activity/exercise needs.;Develop an individualized exercise prescription for aerobic and resistive training based on initial evaluation findings, risk stratification, comorbidities and participant's personal goals.       Expected Outcomes Short Term: Attend rehab on a regular basis to increase amount of physical activity.;Long Term: Exercising regularly at least 3-5 days a week.;Long Term: Add in home exercise to make exercise part of routine and to increase amount of physical activity.       Increase Strength and Stamina Yes       Intervention Provide advice, education, support and counseling about physical activity/exercise needs.;Develop an individualized exercise prescription for aerobic and resistive training based on initial evaluation findings, risk stratification, comorbidities and participant's personal goals.       Expected Outcomes Short Term: Perform resistance training exercises routinely during rehab and add in resistance training at home;Long Term: Improve cardiorespiratory fitness, muscular endurance and strength as  measured by increased METs and functional capacity ( );Short Term: Increase workloads from initial exercise prescription for resistance, speed, and METs.       Able to understand and use rate of perceived exertion (RPE) scale Yes       Intervention Provide education and explanation on how to use RPE scale       Expected Outcomes Short Term: Able to use RPE daily in rehab to express subjective intensity level;Long Term:  Able to use RPE to guide intensity level when exercising independently       Knowledge and understanding of Target Heart Rate Range (THRR) Yes       Intervention Provide education and explanation of THRR including how the numbers were predicted and where they are located for reference       Expected Outcomes Short Term: Able to state/look up THRR;Short Term: Able to use daily as guideline for intensity in rehab;Long Term: Able to use THRR to govern intensity when exercising independently       Understanding of Exercise Prescription Yes       Intervention Provide education, explanation, and written materials on patient's individual exercise prescription       Expected Outcomes Short Term: Able to explain program exercise prescription;Long Term: Able to explain home exercise prescription to exercise independently                Exercise Goals Re-Evaluation :  Exercise Goals Re-Evaluation     Row Name 09/10/23 1136 09/12/23 1333 09/17/23 1028         Exercise Goal Re-Evaluation   Exercise Goals Review Increase Physical Activity;Increase Strength and Stamina;Able to understand and use rate of perceived exertion (RPE) scale Increase Physical Activity;Increase Strength and Stamina;Able to understand and use rate of perceived exertion (RPE) scale Increase Physical Activity;Increase Strength and Stamina;Able to understand and use rate of perceived exertion (RPE) scale     Comments Wing was able to understand and use RPE scale appropriately. Received clearance to increase THRR   up to 130s. Patient had been on the NuStep for 30 minutes due to elevated heart rate with walking. Received clearance from his cardiologist to increase THRR and resumed walking the track today. He tolerated fair, stopping early due to fatigue.     Expected Outcomes Increase duration  to achieve 30 minutes. Progress workloads as tolerated to help increase cardiorespiratory fitness. Will progress workloads to keep heart rate within limits. Will continue to progress as tolerated.              Discharge Exercise Prescription (Final Exercise Prescription Changes):  Exercise Prescription Changes - 09/12/23 1021       Response to Exercise   Blood Pressure (Admit) 108/66    Blood Pressure (Exercise) 118/62    Blood Pressure (Exit) 111/73    Heart Rate (Admit) 85 bpm    Heart Rate (Exercise) 107 bpm    Heart Rate (Exit) 78 bpm    Rating of Perceived Exertion (Exercise) 11    Symptoms None    Comments Moved to NuStep for 30 minutes. Modified warm-up to seated so as not to get too tired.    Duration Progress to 30 minutes of  aerobic without signs/symptoms of physical distress    Intensity THRR New   Up to 130 bpm.     Progression   Progression Continue to progress workloads to maintain intensity without signs/symptoms of physical distress.    Average METs 2.1      Resistance Training   Training Prescription No   relaxation day, no weights.     Interval Training   Interval Training No      NuStep   Level 1    SPM 93    Minutes 30    METs 2.1             Nutrition:  Target Goals: Understanding of nutrition guidelines, daily intake of sodium 1500mg , cholesterol 200mg , calories 30% from fat and 7% or less from saturated fats, daily to have 5 or more servings of fruits and vegetables.  Biometrics:  Pre Biometrics - 09/03/23 1121       Pre Biometrics   Waist Circumference 48 inches    Hip Circumference 53 inches    Waist to Hip Ratio 0.91 %    Triceps Skinfold 27 mm    %  Body Fat 35.7 %    Grip Strength 30 kg    Flexibility 0 in   not done due to back problems   Single Leg Stand 1.5 seconds              Nutrition Therapy Plan and Nutrition Goals:   Nutrition Assessments:  MEDIFICTS Score Key: >=70 Need to make dietary changes  40-70 Heart Healthy Diet <= 40 Therapeutic Level Cholesterol Diet    Picture Your Plate Scores: <40 Unhealthy dietary pattern with much room for improvement. 41-50 Dietary pattern unlikely to meet recommendations for good health and room for improvement. 51-60 More healthful dietary pattern, with some room for improvement.  >60 Healthy dietary pattern, although there may be some specific behaviors that could be improved.    Nutrition Goals Re-Evaluation:   Nutrition Goals Re-Evaluation:   Nutrition Goals Discharge (Final Nutrition Goals Re-Evaluation):   Psychosocial: Target Goals: Acknowledge presence or absence of significant depression and/or stress, maximize coping skills, provide positive support system. Participant is able to verbalize types and ability to use techniques and skills needed for reducing stress and depression.  Initial Review & Psychosocial Screening:  Initial Psych Review & Screening - 09/03/23 1131       Initial Review   Current issues with Current Depression;Current Stress Concerns    Source of Stress Concerns Chronic Illness    Comments Jeremie shared that he has had some depression about his recent decline in  heart function. He stated that he has been on Wellbutrin for quite a while, however doesn't feel it is working well. Encouraged to discuss with PCP. Vitor denies any need for additional resources, stating "at the end of the day, it's up to me to fix".      Family Dynamics   Good Support System? Yes   Cari has his wife for support     Barriers   Psychosocial barriers to participate in program The patient should benefit from training in stress management and relaxation.       Screening Interventions   Interventions Provide feedback about the scores to participant;To provide support and resources with identified psychosocial needs;Encouraged to exercise    Expected Outcomes Short Term goal: Identification and review with participant of any Quality of Life or Depression concerns found by scoring the questionnaire.;Long Term Goal: Stressors or current issues are controlled or eliminated.;Long Term goal: The participant improves quality of Life and PHQ9 Scores as seen by post scores and/or verbalization of changes             Quality of Life Scores:  Quality of Life - 09/03/23 1143       Quality of Life   Select Quality of Life      Quality of Life Scores   Health/Function Pre 20.07 %    Socioeconomic Pre 24.83 %    Psych/Spiritual Pre 21.86 %    Family Pre 30 %    GLOBAL Pre 22.82 %            Scores of 19 and below usually indicate a poorer quality of life in these areas.  A difference of  2-3 points is a clinically meaningful difference.  A difference of 2-3 points in the total score of the Quality of Life Index has been associated with significant improvement in overall quality of life, self-image, physical symptoms, and general health in studies assessing change in quality of life.  PHQ-9: Review Flowsheet       09/03/2023  Depression screen PHQ 2/9  Decreased Interest 1  Down, Depressed, Hopeless 1  PHQ - 2 Score 2  Altered sleeping 1  Tired, decreased energy 3  Change in appetite 0  Feeling bad or failure about yourself  0  Trouble concentrating 0  Moving slowly or fidgety/restless 0  Suicidal thoughts 0  PHQ-9 Score 6  Difficult doing work/chores Not difficult at all    Details           Interpretation of Total Score  Total Score Depression Severity:  1-4 = Minimal depression, 5-9 = Mild depression, 10-14 = Moderate depression, 15-19 = Moderately severe depression, 20-27 = Severe depression   Psychosocial Evaluation and  Intervention:   Psychosocial Re-Evaluation:  Psychosocial Re-Evaluation     Row Name 09/11/23 0905 09/18/23 1337           Psychosocial Re-Evaluation   Current issues with Current Stress Concerns;Current Depression Current Stress Concerns;Current Depression      Comments Sadie did not voice any increased concerns or stressors on his first day of exercise. Will discuss PHq2-9 in the upcoming week Jahfari says he is dissatisfied with his health due to his heart failure diagnosis.      Expected Outcomes Haven will have controlled or decreased depression/ stress upon completion of cardiac rehab Sebastian will have controlled or decreased depression/ stress upon completion of cardiac rehab      Interventions Stress management education;Encouraged to attend Cardiac Rehabilitation for the exercise;Relaxation  education Stress management education;Encouraged to attend Cardiac Rehabilitation for the exercise;Relaxation education      Continue Psychosocial Services  No Follow up required Follow up required by staff        Initial Review   Source of Stress Concerns Chronic Illness;Unable to participate in former interests or hobbies;Unable to perform yard/household activities Chronic Illness;Unable to participate in former interests or hobbies;Unable to perform yard/household activities      Comments Will continue to monitor and offer support as needed. Will continue to monitor and offer support as needed.               Psychosocial Discharge (Final Psychosocial Re-Evaluation):  Psychosocial Re-Evaluation - 09/18/23 1337       Psychosocial Re-Evaluation   Current issues with Current Stress Concerns;Current Depression    Comments Eilert says he is dissatisfied with his health due to his heart failure diagnosis.    Expected Outcomes Damariae will have controlled or decreased depression/ stress upon completion of cardiac rehab    Interventions Stress management education;Encouraged to attend Cardiac  Rehabilitation for the exercise;Relaxation education    Continue Psychosocial Services  Follow up required by staff      Initial Review   Source of Stress Concerns Chronic Illness;Unable to participate in former interests or hobbies;Unable to perform yard/household activities    Comments Will continue to monitor and offer support as needed.             Vocational Rehabilitation: Provide vocational rehab assistance to qualifying candidates.   Vocational Rehab Evaluation & Intervention:  Vocational Rehab - 09/03/23 1120       Initial Vocational Rehab Evaluation & Intervention   Assessment shows need for Vocational Rehabilitation No   Lunden is retired            Education: Education Goals: Education classes will be provided on a weekly basis, covering required topics. Participant will state understanding/return demonstration of topics presented.     Core Videos: Exercise    Move It!  Clinical staff conducted group or individual video education with verbal and written material and guidebook.  Patient learns the recommended Pritikin exercise program. Exercise with the goal of living a long, healthy life. Some of the health benefits of exercise include controlled diabetes, healthier blood pressure levels, improved cholesterol levels, improved heart and lung capacity, improved sleep, and better body composition. Everyone should speak with their doctor before starting or changing an exercise routine.  Biomechanical Limitations Clinical staff conducted group or individual video education with verbal and written material and guidebook.  Patient learns how biomechanical limitations can impact exercise and how we can mitigate and possibly overcome limitations to have an impactful and balanced exercise routine.  Body Composition Clinical staff conducted group or individual video education with verbal and written material and guidebook.  Patient learns that body composition (ratio of  muscle mass to fat mass) is a key component to assessing overall fitness, rather than body weight alone. Increased fat mass, especially visceral belly fat, can put [place] at increased risk for metabolic syndrome, type 2 diabetes, heart disease, and even death. It is recommended to combine diet and exercise (cardiovascular and resistance training) to improve your body composition. Seek guidance from your physician and exercise physiologist before implementing an exercise routine.  Exercise Action Plan Clinical staff conducted group or individual video education with verbal and written material and guidebook.  Patient learns the recommended strategies to achieve and enjoy long-term exercise adherence, including variety, self-motivation, self-efficacy,  and positive decision making. Benefits of exercise include fitness, good health, weight management, more energy, better sleep, less stress, and overall well-being.  Medical   Heart Disease Risk Reduction Clinical staff conducted group or individual video education with verbal and written material and guidebook.  Patient learns our heart is our most vital organ as it circulates oxygen, nutrients, white blood cells, and hormones throughout the entire body, and carries waste away. Data supports a plant-based eating plan like the Pritikin Program for its effectiveness in slowing progression of and reversing heart disease. The video provides a number of recommendations to address heart disease.   Metabolic Syndrome and Belly Fat  Clinical staff conducted group or individual video education with verbal and written material and guidebook.  Patient learns what metabolic syndrome is, how it leads to heart disease, and how one can reverse it and keep it from coming back. You have metabolic syndrome if you have 3 of the following 5 criteria: abdominal obesity, high blood pressure, high triglycerides, low HDL cholesterol, and high blood sugar.  Hypertension and Heart  Disease Clinical staff conducted group or individual video education with verbal and written material and guidebook.  Patient learns that high blood pressure, or hypertension, is very common in the [place]. Hypertension is largely due to excessive salt intake, but other important risk factors include being overweight, physical inactivity, drinking too much alcohol, smoking, and not eating enough potassium from fruits and vegetables. High blood pressure is a leading risk factor for heart attack, stroke, congestive heart failure, dementia, kidney failure, and premature death. Long-term effects of excessive salt intake include stiffening of the arteries and thickening of heart muscle and organ damage. Recommendations include ways to reduce hypertension and the risk of heart disease.  Diseases of Our Time - Focusing on Diabetes Clinical staff conducted group or individual video education with verbal and written material and guidebook.  Patient learns why the best way to stop diseases of our time is prevention, through food and other lifestyle changes. Medicine (such as prescription pills and surgeries) is often only a Band-Aid on the problem, not a long-term solution. Most common diseases of our time include obesity, type 2 diabetes, hypertension, heart disease, and cancer. The Pritikin Program is recommended and has been proven to help reduce, reverse, and/or prevent the damaging effects of metabolic syndrome.  Nutrition   Overview of the Pritikin Eating Plan  Clinical staff conducted group or individual video education with verbal and written material and guidebook.  Patient learns about the Pritikin Eating Plan for disease risk reduction. The Pritikin Eating Plan emphasizes a wide variety of unrefined, minimally-processed carbohydrates, like fruits, vegetables, whole grains, and legumes. Go, Caution, and Stop food choices are explained. Plant-based and lean animal proteins are emphasized. Rationale  provided for low sodium intake for blood pressure control, low added sugars for blood sugar stabilization, and low added fats and oils for coronary artery disease risk reduction and weight management.  Calorie Density  Clinical staff conducted group or individual video education with verbal and written material and guidebook.  Patient learns about calorie density and how it impacts the Pritikin Eating Plan. Knowing the characteristics of the food you choose will help you decide whether those foods will lead to weight gain or weight loss, and whether you want to consume more or less of them. Weight loss is usually a side effect of the Pritikin Eating Plan because of its focus on low calorie-dense foods.  Label Reading  Clinical staff  conducted group or individual video education with verbal and written material and guidebook.  Patient learns about the Pritikin recommended label reading guidelines and corresponding recommendations regarding calorie density, added sugars, sodium content, and whole grains.  Dining Out - Part 1  Clinical staff conducted group or individual video education with verbal and written material and guidebook.  Patient learns that restaurant meals can be sabotaging because they can be so high in calories, fat, sodium, and/or sugar. Patient learns recommended strategies on how to positively address this and avoid unhealthy pitfalls.  Facts on Fats  Clinical staff conducted group or individual video education with verbal and written material and guidebook.  Patient learns that lifestyle modifications can be just as effective, if not more so, as many medications for lowering your risk of heart disease. A Pritikin lifestyle can help to reduce your risk of inflammation and atherosclerosis (cholesterol build-up, or plaque, in the artery walls). Lifestyle interventions such as dietary choices and physical activity address the cause of atherosclerosis. A review of the types of fats and  their impact on blood cholesterol levels, along with dietary recommendations to reduce fat intake is also included.  Nutrition Action Plan  Clinical staff conducted group or individual video education with verbal and written material and guidebook.  Patient learns how to incorporate Pritikin recommendations into their lifestyle. Recommendations include planning and keeping personal health goals in mind as an important part of their success.  Healthy Mind-Set    Healthy Minds, Bodies, Hearts  Clinical staff conducted group or individual video education with verbal and written material and guidebook.  Patient learns how to identify when they are stressed. Video will discuss the impact of that stress, as well as the many benefits of stress management. Patient will also be introduced to stress management techniques. The way we think, act, and feel has an impact on our hearts.  How Our Thoughts Can Heal Our Hearts  Clinical staff conducted group or individual video education with verbal and written material and guidebook.  Patient learns that negative thoughts can cause depression and anxiety. This can result in negative lifestyle behavior and serious health problems. Cognitive behavioral therapy is an effective method to help control our thoughts in order to change and improve our emotional outlook.  Additional Videos:  Exercise    Improving Performance  Clinical staff conducted group or individual video education with verbal and written material and guidebook.  Patient learns to use a non-linear approach by alternating intensity levels and lengths of time spent exercising to help burn more calories and lose more body fat. Cardiovascular exercise helps improve heart health, metabolism, hormonal balance, blood sugar control, and recovery from fatigue. Resistance training improves strength, endurance, balance, coordination, reaction time, metabolism, and muscle mass. Flexibility exercise improves  circulation, posture, and balance. Seek guidance from your physician and exercise physiologist before implementing an exercise routine and learn your capabilities and proper form for all exercise.  Introduction to Yoga  Clinical staff conducted group or individual video education with verbal and written material and guidebook.  Patient learns about yoga, a discipline of the coming together of mind, breath, and body. The benefits of yoga include improved flexibility, improved range of motion, better posture and core strength, increased lung function, weight loss, and positive self-image. Yoga's heart health benefits include lowered blood pressure, healthier heart rate, decreased cholesterol and triglyceride levels, improved immune function, and reduced stress. Seek guidance from your physician and exercise physiologist before implementing an exercise routine and learn  your capabilities and proper form for all exercise.  Medical   Aging: Enhancing Your Quality of Life  Clinical staff conducted group or individual video education with verbal and written material and guidebook.  Patient learns key strategies and recommendations to stay in good physical health and enhance quality of life, such as prevention strategies, having an advocate, securing a Health Care Proxy and Power of Attorney, and keeping a list of medications and system for tracking them. It also discusses how to avoid risk for bone loss.  Biology of Weight Control  Clinical staff conducted group or individual video education with verbal and written material and guidebook.  Patient learns that weight gain occurs because we consume more calories than we burn (eating more, moving less). Even if your body weight is normal, you may have higher ratios of fat compared to muscle mass. Too much body fat puts you at increased risk for cardiovascular disease, heart attack, stroke, type 2 diabetes, and obesity-related cancers. In addition to exercise,  following the Pritikin Eating Plan can help reduce your risk.  Decoding Lab Results  Clinical staff conducted group or individual video education with verbal and written material and guidebook.  Patient learns that lab test reflects one measurement whose values change over time and are influenced by many factors, including medication, stress, sleep, exercise, food, hydration, pre-existing medical conditions, and more. It is recommended to use the knowledge from this video to become more involved with your lab results and evaluate your numbers to speak with your doctor.   Diseases of Our Time - Overview  Clinical staff conducted group or individual video education with verbal and written material and guidebook.  Patient learns that according to the CDC, 50% to 70% of chronic diseases (such as obesity, type 2 diabetes, elevated lipids, hypertension, and heart disease) are avoidable through lifestyle improvements including healthier food choices, listening to satiety cues, and increased physical activity.  Sleep Disorders Clinical staff conducted group or individual video education with verbal and written material and guidebook.  Patient learns how good quality and duration of sleep are important to overall health and well-being. Patient also learns about sleep disorders and how they impact health along with recommendations to address them, including discussing with a physician.  Nutrition  Dining Out - Part 2 Clinical staff conducted group or individual video education with verbal and written material and guidebook.  Patient learns how to plan ahead and communicate in order to maximize their dining experience in a healthy and nutritious manner. Included are recommended food choices based on the type of restaurant the patient is visiting.   Fueling a Banker conducted group or individual video education with verbal and written material and guidebook.  There is a strong  connection between our food choices and our health. Diseases like obesity and type 2 diabetes are very prevalent and are in large-part due to lifestyle choices. The Pritikin Eating Plan provides plenty of food and hunger-curbing satisfaction. It is easy to follow, affordable, and helps reduce health risks.  Menu Workshop  Clinical staff conducted group or individual video education with verbal and written material and guidebook.  Patient learns that restaurant meals can sabotage health goals because they are often packed with calories, fat, sodium, and sugar. Recommendations include strategies to plan ahead and to communicate with the manager, chef, or server to help order a healthier meal.  Planning Your Eating Strategy  Clinical staff conducted group or individual video education with verbal and  written material and guidebook.  Patient learns about the Pritikin Eating Plan and its benefit of reducing the risk of disease. The Pritikin Eating Plan does not focus on calories. Instead, it emphasizes high-quality, nutrient-rich foods. By knowing the characteristics of the foods, we choose, we can determine their calorie density and make informed decisions.  Targeting Your Nutrition Priorities  Clinical staff conducted group or individual video education with verbal and written material and guidebook.  Patient learns that lifestyle habits have a tremendous impact on disease risk and progression. This video provides eating and physical activity recommendations based on your personal health goals, such as reducing LDL cholesterol, losing weight, preventing or controlling type 2 diabetes, and reducing high blood pressure.  Vitamins and Minerals  Clinical staff conducted group or individual video education with verbal and written material and guidebook.  Patient learns different ways to obtain key vitamins and minerals, including through a recommended healthy diet. It is important to discuss all supplements  you take with your doctor.   Healthy Mind-Set    Smoking Cessation  Clinical staff conducted group or individual video education with verbal and written material and guidebook.  Patient learns that cigarette smoking and tobacco addiction pose a serious health risk which affects millions of people. Stopping smoking will significantly reduce the risk of heart disease, lung disease, and many forms of cancer. Recommended strategies for quitting are covered, including working with your doctor to develop a successful plan.  Culinary   Becoming a Set designer conducted group or individual video education with verbal and written material and guidebook.  Patient learns that cooking at home can be healthy, cost-effective, quick, and puts them in control. Keys to cooking healthy recipes will include looking at your recipe, assessing your equipment needs, planning ahead, making it simple, choosing cost-effective seasonal ingredients, and limiting the use of added fats, salts, and sugars.  Cooking - Breakfast and Snacks  Clinical staff conducted group or individual video education with verbal and written material and guidebook.  Patient learns how important breakfast is to satiety and nutrition through the entire day. Recommendations include key foods to eat during breakfast to help stabilize blood sugar levels and to prevent overeating at meals later in the day. Planning ahead is also a key component.  Cooking - Educational psychologist conducted group or individual video education with verbal and written material and guidebook.  Patient learns eating strategies to improve overall health, including an approach to cook more at home. Recommendations include thinking of animal protein as a side on your plate rather than center stage and focusing instead on lower calorie dense options like vegetables, fruits, whole grains, and plant-based proteins, such as beans. Making sauces in large  quantities to freeze for later and leaving the skin on your vegetables are also recommended to maximize your experience.  Cooking - Healthy Salads and Dressing Clinical staff conducted group or individual video education with verbal and written material and guidebook.  Patient learns that vegetables, fruits, whole grains, and legumes are the foundations of the Pritikin Eating Plan. Recommendations include how to incorporate each of these in flavorful and healthy salads, and how to create homemade salad dressings. Proper handling of ingredients is also covered. Cooking - Soups and [employer] - Soups and Desserts Clinical staff conducted group or individual video education with verbal and written material and guidebook.  Patient learns that Pritikin soups and desserts make for easy, nutritious, and delicious snacks and  meal components that are low in sodium, fat, sugar, and calorie density, while high in vitamins, minerals, and filling fiber. Recommendations include simple and healthy ideas for soups and desserts.   Overview     The Pritikin Solution Program Overview Clinical staff conducted group or individual video education with verbal and written material and guidebook.  Patient learns that the results of the Pritikin Program have been documented in more than 100 articles published in peer-reviewed journals, and the benefits include reducing risk factors for (and, in some cases, even reversing) high cholesterol, high blood pressure, type 2 diabetes, obesity, and more! An overview of the three key pillars of the Pritikin Program will be covered: eating well, doing regular exercise, and having a healthy mind-set.  WORKSHOPS  Exercise: Exercise Basics: Building Your Action Plan Clinical staff led group instruction and group discussion with PowerPoint presentation and patient guidebook. To enhance the learning environment the use of posters, models and videos may be added. At the conclusion of  this workshop, patients will comprehend the difference between physical activity and exercise, as well as the benefits of incorporating both, into their routine. Patients will understand the FITT (Frequency, Intensity, Time, and Type) principle and how to use it to build an exercise action plan. In addition, safety concerns and other considerations for exercise and cardiac rehab will be addressed by the presenter. The purpose of this lesson is to promote a comprehensive and effective weekly exercise routine in order to improve patients' overall level of fitness.   Managing Heart Disease: Your Path to a Healthier Heart Clinical staff led group instruction and group discussion with PowerPoint presentation and patient guidebook. To enhance the learning environment the use of posters, models and videos may be added.At the conclusion of this workshop, patients will understand the anatomy and physiology of the heart. Additionally, they will understand how Pritikin's three pillars impact the risk factors, the progression, and the management of heart disease.  The purpose of this lesson is to provide a high-level overview of the heart, heart disease, and how the Pritikin lifestyle positively impacts risk factors.  Exercise Biomechanics Clinical staff led group instruction and group discussion with PowerPoint presentation and patient guidebook. To enhance the learning environment the use of posters, models and videos may be added. Patients will learn how the structural parts of their bodies function and how these functions impact their daily activities, movement, and exercise. Patients will learn how to promote a neutral spine, learn how to manage pain, and identify ways to improve their physical movement in order to promote healthy living. The purpose of this lesson is to expose patients to common physical limitations that impact physical activity. Participants will learn practical ways to adapt and  manage aches and pains, and to minimize their effect on regular exercise. Patients will learn how to maintain good posture while sitting, walking, and lifting.  Balance Training and Fall Prevention  Clinical staff led group instruction and group discussion with PowerPoint presentation and patient guidebook. To enhance the learning environment the use of posters, models and videos may be added. At the conclusion of this workshop, patients will understand the importance of their sensorimotor skills (vision, proprioception, and the vestibular system) in maintaining their ability to balance as they age. Patients will apply a variety of balancing exercises that are appropriate for their current level of function. Patients will understand the common causes for poor balance, possible solutions to these problems, and ways to modify their physical environment in order to  minimize their fall risk. The purpose of this lesson is to teach patients about the importance of maintaining balance as they age and ways to minimize their risk of falling.  WORKSHOPS   Nutrition:  Fueling a Ship broker led group instruction and group discussion with PowerPoint presentation and patient guidebook. To enhance the learning environment the use of posters, models and videos may be added. Patients will review the foundational principles of the Pritikin Eating Plan and understand what constitutes a serving size in each of the food groups. Patients will also learn Pritikin-friendly foods that are better choices when away from home and review make-ahead meal and snack options. Calorie density will be reviewed and applied to three nutrition priorities: weight maintenance, weight loss, and weight gain. The purpose of this lesson is to reinforce (in a group setting) the key concepts around what patients are recommended to eat and how to apply these guidelines when away from home by planning and selecting Pritikin-friendly  options. Patients will understand how calorie density may be adjusted for different weight management goals.  Mindful Eating  Clinical staff led group instruction and group discussion with PowerPoint presentation and patient guidebook. To enhance the learning environment the use of posters, models and videos may be added. Patients will briefly review the concepts of the Pritikin Eating Plan and the importance of low-calorie dense foods. The concept of mindful eating will be introduced as well as the importance of paying attention to internal hunger signals. Triggers for non-hunger eating and techniques for dealing with triggers will be explored. The purpose of this lesson is to provide patients with the opportunity to review the basic principles of the Pritikin Eating Plan, discuss the value of eating mindfully and how to measure internal cues of hunger and fullness using the Hunger Scale. Patients will also discuss reasons for non-hunger eating and learn strategies to use for controlling emotional eating.  Targeting Your Nutrition Priorities Clinical staff led group instruction and group discussion with PowerPoint presentation and patient guidebook. To enhance the learning environment the use of posters, models and videos may be added. Patients will learn how to determine their genetic susceptibility to disease by reviewing their family history. Patients will gain insight into the importance of diet as part of an overall healthy lifestyle in mitigating the impact of genetics and other environmental insults. The purpose of this lesson is to provide patients with the opportunity to assess their personal nutrition priorities by looking at their family history, their own health history and current risk factors. Patients will also be able to discuss ways of prioritizing and modifying the Pritikin Eating Plan for their highest risk areas  Menu  Clinical staff led group instruction and group discussion with  PowerPoint presentation and patient guidebook. To enhance the learning environment the use of posters, models and videos may be added. Using menus brought in from [employer], or printed from [employer], patients will apply the Pritikin dining out guidelines that were presented in the [employer] video. Patients will also be able to practice these guidelines in a variety of provided scenarios. The purpose of this lesson is to provide patients with the opportunity to practice hands-on learning of the Pritikin Dining Out guidelines with actual menus and practice scenarios.  Label Reading Clinical staff led group instruction and group discussion with PowerPoint presentation and patient guidebook. To enhance the learning environment the use of posters, models and videos may be added. Patients will review and discuss  the Pritikin label reading guidelines presented in Pritikin's Label Reading Educational series video. Using fool labels brought in from local grocery stores and markets, patients will apply the label reading guidelines and determine if the packaged food meet the Pritikin guidelines. The purpose of this lesson is to provide patients with the opportunity to review, discuss, and practice hands-on learning of the Pritikin Label Reading guidelines with actual packaged food labels. Cooking School  Pritikin's [employer] are designed to teach patients ways to prepare quick, simple, and affordable recipes at home. The importance of nutrition's role in chronic disease risk reduction is reflected in its emphasis in the overall Pritikin program. By learning how to prepare essential core Pritikin Eating Plan recipes, patients will increase control over what they eat; be able to customize the flavor of foods without the use of added salt, sugar, or fat; and improve the quality of the food they consume. By learning a set of core recipes which are easily assembled, quickly  prepared, and affordable, patients are more likely to prepare more healthy foods at home. These workshops focus on convenient breakfasts, simple entres, side dishes, and desserts which can be prepared with minimal effort and are consistent with nutrition recommendations for cardiovascular risk reduction. Cooking [employer] are taught by a Armed forces logistics/support/administrative officer (RD) who has been trained by the [employer]. The chef or RD has a clear understanding of the importance of minimizing - if not completely eliminating - added fat, sugar, and sodium in recipes. Throughout the series of Cooking School Workshop sessions, patients will learn about healthy ingredients and efficient methods of cooking to build confidence in their capability to prepare    Cooking School weekly topics:  Adding Flavor- Sodium-Free  Fast and Healthy Breakfasts  Powerhouse Plant-Based Proteins  Satisfying Salads and Dressings  Simple Sides and Sauces  International Cuisine-Spotlight on the [employer] Zones  Delicious Desserts  Savory Soups  [employer] - Meals in a Astronomer Appetizers and Snacks  Comforting Weekend Breakfasts  One-Pot Wonders   Fast Evening Meals  Landscape architect Your Pritikin Plate  WORKSHOPS   Healthy Mindset (Psychosocial):  Focused Goals, Sustainable Changes Clinical staff led group instruction and group discussion with PowerPoint presentation and patient guidebook. To enhance the learning environment the use of posters, models and videos may be added. Patients will be able to apply effective goal setting strategies to establish at least one personal goal, and then take consistent, meaningful action toward that goal. They will learn to identify common barriers to achieving personal goals and develop strategies to overcome them. Patients will also gain an understanding of how our mind-set can impact our ability to achieve goals and the importance of  cultivating a positive and growth-oriented mind-set. The purpose of this lesson is to provide patients with a deeper understanding of how to set and achieve personal goals, as well as the tools and strategies needed to overcome common obstacles which may arise along the way.  From Head to Heart: The Power of a Healthy Outlook  Clinical staff led group instruction and group discussion with PowerPoint presentation and patient guidebook. To enhance the learning environment the use of posters, models and videos may be added. Patients will be able to recognize and describe the impact of emotions and mood on physical health. They will discover the importance of self-care and explore self-care practices which may work for them. Patients will also learn how to utilize the 4 C's  to cultivate a healthier outlook and better manage stress and challenges. The purpose of this lesson is to demonstrate to patients how a healthy outlook is an essential part of maintaining good health, especially as they continue their cardiac rehab journey.  Healthy Sleep for a Healthy Heart Clinical staff led group instruction and group discussion with PowerPoint presentation and patient guidebook. To enhance the learning environment the use of posters, models and videos may be added. At the conclusion of this workshop, patients will be able to demonstrate knowledge of the importance of sleep to overall health, well-being, and quality of life. They will understand the symptoms of, and treatments for, common sleep disorders. Patients will also be able to identify daytime and nighttime behaviors which impact sleep, and they will be able to apply these tools to help manage sleep-related challenges. The purpose of this lesson is to provide patients with a general overview of sleep and outline the importance of quality sleep. Patients will learn about a few of the most common sleep disorders. Patients will also be introduced to the concept of  "sleep hygiene," and discover ways to self-manage certain sleeping problems through simple daily behavior changes. Finally, the workshop will motivate patients by clarifying the links between quality sleep and their goals of heart-healthy living.   Recognizing and Reducing Stress Clinical staff led group instruction and group discussion with PowerPoint presentation and patient guidebook. To enhance the learning environment the use of posters, models and videos may be added. At the conclusion of this workshop, patients will be able to understand the types of stress reactions, differentiate between acute and chronic stress, and recognize the impact that chronic stress has on their health. They will also be able to apply different coping mechanisms, such as reframing negative self-talk. Patients will have the opportunity to practice a variety of stress management techniques, such as deep abdominal breathing, progressive muscle relaxation, and/or guided imagery.  The purpose of this lesson is to educate patients on the role of stress in their lives and to provide healthy techniques for coping with it.  Learning Barriers/Preferences:  Learning Barriers/Preferences - 09/03/23 1119       Learning Barriers/Preferences   Learning Barriers Sight   wears glasses   Learning Preferences Audio;Group Instruction;Individual Instruction;Pictoral;Skilled Demonstration;Verbal Instruction;Written Material;Computer/Internet;Video             Education Topics:  Knowledge Questionnaire Score:  Knowledge Questionnaire Score - 09/03/23 1120       Knowledge Questionnaire Score   Pre Score 21/24             Core Components/Risk Factors/Patient Goals at Admission:  Personal Goals and Risk Factors at Admission - 09/03/23 1120       Core Components/Risk Factors/Patient Goals on Admission    Weight Management Yes;Obesity;Weight Loss    Intervention Weight Management: Develop a combined nutrition and  exercise program designed to reach desired caloric intake, while maintaining appropriate intake of nutrient and fiber, sodium and fats, and appropriate energy expenditure required for the weight goal.;Weight Management/Obesity: Establish reasonable short term and long term weight goals.;Weight Management: Provide education and appropriate resources to help participant work on and attain dietary goals.;Obesity: Provide education and appropriate resources to help participant work on and attain dietary goals.    Expected Outcomes Short Term: Continue to assess and modify interventions until short term weight is achieved;Long Term: Adherence to nutrition and physical activity/exercise program aimed toward attainment of established weight goal;Understanding recommendations for meals to include 15-35% energy as  protein, 25-35% energy from fat, 35-60% energy from carbohydrates, less than 200mg  of dietary cholesterol, 20-35 gm of total fiber daily;Weight Loss: Understanding of general recommendations for a balanced deficit meal plan, which promotes 1-2 lb weight loss per week and includes a negative energy balance of [phone] kcal/d;Understanding of distribution of calorie intake throughout the day with the consumption of 4-5 meals/snacks    Diabetes Yes    Intervention Provide education about signs/symptoms and action to take for hypo/hyperglycemia.;Provide education about proper nutrition, including hydration, and aerobic/resistive exercise prescription along with prescribed medications to achieve blood glucose in normal ranges: Fasting glucose 65-99 mg/dL    Expected Outcomes Short Term: Participant verbalizes understanding of the signs/symptoms and immediate care of hyper/hypoglycemia, proper foot care and importance of medication, aerobic/resistive exercise and nutrition plan for blood glucose control.;Long Term: Attainment of HbA1C < 7%.    Heart Failure Yes    Intervention Provide a combined exercise and  nutrition program that is supplemented with education, support and counseling about heart failure. Directed toward relieving symptoms such as shortness of breath, decreased exercise tolerance, and extremity edema.    Expected Outcomes Long term: Adoption of self-care skills and reduction of barriers for early signs and symptoms recognition and intervention leading to self-care maintenance.;Short term: Daily weights obtained and reported for increase. Utilizing diuretic protocols set by physician.;Short term: Attendance in program 2-3 days a week with increased exercise capacity. Reported lower sodium intake. Reported increased fruit and vegetable intake. Reports medication compliance.;Improve functional capacity of life    Hypertension Yes    Intervention Provide education on lifestyle modifcations including regular physical activity/exercise, weight management, moderate sodium restriction and increased consumption of fresh fruit, vegetables, and low fat dairy, alcohol moderation, and smoking cessation.;Monitor prescription use compliance.    Expected Outcomes Short Term: Continued assessment and intervention until BP is < 140/25mm HG in hypertensive participants. < 130/18mm HG in hypertensive participants with diabetes, heart failure or chronic kidney disease.;Long Term: Maintenance of blood pressure at goal levels.    Stress Yes    Intervention Offer individual and/or small group education and counseling on adjustment to heart disease, stress management and health-related lifestyle change. Teach and support self-help strategies.;Refer participants experiencing significant psychosocial distress to appropriate mental health specialists for further evaluation and treatment. When possible, include family members and significant others in education/counseling sessions.    Expected Outcomes Short Term: Participant demonstrates changes in health-related behavior, relaxation and other stress management skills,  ability to obtain effective social support, and compliance with psychotropic medications if prescribed.;Long Term: Emotional wellbeing is indicated by absence of clinically significant psychosocial distress or social isolation.             Core Components/Risk Factors/Patient Goals Review:   Goals and Risk Factor Review     Row Name 09/11/23 0910 09/18/23 1339           Core Components/Risk Factors/Patient Goals Review   Personal Goals Review Weight Management/Obesity;Lipids;Stress;Heart Failure;Diabetes;Hypertension Weight Management/Obesity;Lipids;Stress;Heart Failure;Diabetes;Hypertension      Review Tkai did fair with exercise for his fitness level. vital signs and CBG's were stable. Louay did exceed his target heart rate when walking the track and was switched to the nustep. Kratos is deconditioned. Quatavius has a follow an appointment with Dr Elwyn Lade on 09/11/23. Pace did fair with exercise for his fitness level. vital signs have bbenstable.  patient is on Midodrine. Daril did receive a  target heart rate increase per Dr Elwyn Lade. Cielo remains deconditioned.  Will continue  to monitor.      Expected Outcomes Roberta will continue to participate in cardiac rehab for exercise, nutrition and lifestyle modifications. Corrin will continue to participate in cardiac rehab for exercise, nutrition and lifestyle modifications.               Core Components/Risk Factors/Patient Goals at Discharge (Final Review):   Goals and Risk Factor Review - 09/18/23 1339       Core Components/Risk Factors/Patient Goals Review   Personal Goals Review Weight Management/Obesity;Lipids;Stress;Heart Failure;Diabetes;Hypertension    Review Vanny did fair with exercise for his fitness level. vital signs have bbenstable.  patient is on Midodrine. Jeancarlo did receive a  target heart rate increase per Dr Elwyn Lade. Landrey remains deconditioned.  Will continue to monitor.    Expected Outcomes Sumanth will continue to  participate in cardiac rehab for exercise, nutrition and lifestyle modifications.             ITP Comments:  ITP Comments     Row Name 09/03/23 1115 09/11/23 0902 09/18/23 1330       ITP Comments Dr. Armanda Magic medical director. Introduction to pritikin education/intensive cardiac rehab. Initial orientation packet reviewed with patient. 30 Day ITP Review. Lelynd started cardiac rehab on 09/10/23. Mercy did well with exercise for his fitness level as Brylin is somewhat decondtioned. 30 Day ITP Review. Cabot is off to a good start to  exercise for his fitness level              Comments: See ITP Comments

## 2023-09-19 ENCOUNTER — Telehealth (HOSPITAL_COMMUNITY): Payer: Self-pay

## 2023-09-19 ENCOUNTER — Encounter (HOSPITAL_COMMUNITY): Payer: Medicare Other

## 2023-09-19 NOTE — Telephone Encounter (Signed)
PT called and canceled his appointment today due to having a fall on Monday night. He explained EMS had to come help get him up as he could not get up out of the floor and he has a bruised tail bone.

## 2023-09-20 ENCOUNTER — Telehealth (HOSPITAL_COMMUNITY): Payer: Self-pay

## 2023-09-20 ENCOUNTER — Encounter: Payer: Self-pay | Admitting: Cardiovascular Disease

## 2023-09-20 NOTE — Telephone Encounter (Signed)
Called pt in regards to yesterdays cancellation. LM on VM pt has to have note to return

## 2023-09-20 NOTE — Telephone Encounter (Signed)
Returned pt phone call in regards to CR, adv pt he will need to complete a f/u appt due to fall. Pt stated he will contact his PCP and schedule appt. He also stated he will like to hold off with CR until 12/30 for his tail bone to heal.

## 2023-09-21 ENCOUNTER — Encounter (HOSPITAL_COMMUNITY): Payer: Medicare Other

## 2023-09-24 ENCOUNTER — Encounter (HOSPITAL_COMMUNITY): Payer: Medicare Other

## 2023-09-26 ENCOUNTER — Telehealth (HOSPITAL_COMMUNITY): Payer: Self-pay | Admitting: *Deleted

## 2023-09-26 ENCOUNTER — Encounter (HOSPITAL_COMMUNITY): Payer: Medicare Other

## 2023-09-26 NOTE — Telephone Encounter (Signed)
Message left on departmental voicemail for cardiac rehab.  Pt request call back - no reason given.  Phone number provided.  Message forward to the case manager RN for phase II. Alanson Aly, BSN Cardiac and Emergency planning/management officer

## 2023-09-26 NOTE — Telephone Encounter (Signed)
Patient called concerned about hearing "alarm"in his chest from his ?device. AT the time he was working with his CPAP machine.  Unclear if the device was alarming or his CPAP machine.  Pt ws asymptomatic however concerned as he called to his EP provider in [place] that he saw before he relocated. He was informed to call to his provider on [address]. He called and received message that the office was closed he was unsure of what to do next so he called Cardiac Rehab. We attempted to call the [address] office unable to connect with the answering service. Talked to Bernadene Person NP who is the supervising provider today for Cardiac Rehab.  Explained what had happened and sought advice.  Irving Burton contacted the covering provider who was not getting the calls.  Irving Burton suggested that since the device clinic was not available to contact the EP provider on call.  Looked at [facility] - Dr. Nelly Laurence is listed.  Pt seen on 11/21 to establish care with Dr. Nelly Laurence. Paged Dr.  Nelly Laurence EP, received call back.  Updated with the events.  Advised that the pt did send a manual transmission. Dr. Nelly Laurence is unable to view transmission until transmitted by the device clinic therefore he will be unable to view strips until tomorrow. Alanson Aly, BSN Cardiac and Emergency planning/management officer

## 2023-09-26 NOTE — Telephone Encounter (Signed)
Mr. Buth called regarding his ICD device alarming. We consulted with the onsite provider Bernadene Person, NP and the department nurse navigator who paged Dr. Nelly Laurence for advice. I relayed to patient the message provided. If his device alarms again, and he's asymptomatic to send another transmission. If patient receives a shock and/or is symptomatic to call 911. Welch was very appreciative of the assistance and verbalizes understanding of instructions provided.  Artist Pais, MS, ACSM CEP

## 2023-09-26 NOTE — Telephone Encounter (Signed)
Attempted to call back did not leave a message.Thayer Headings RN BSN

## 2023-09-27 ENCOUNTER — Telehealth (HOSPITAL_COMMUNITY): Payer: Self-pay

## 2023-09-27 NOTE — Telephone Encounter (Signed)
Pt called on 12/17 to advise [place] that he has a f/u with doc for clearance for cardiac rehab on 12/27.

## 2023-09-28 ENCOUNTER — Encounter (HOSPITAL_COMMUNITY): Payer: Medicare Other

## 2023-10-01 ENCOUNTER — Encounter (HOSPITAL_COMMUNITY): Payer: Medicare Other

## 2023-10-05 ENCOUNTER — Encounter (HOSPITAL_COMMUNITY): Payer: Medicare Other

## 2023-10-08 ENCOUNTER — Encounter (HOSPITAL_COMMUNITY): Payer: Medicare Other

## 2023-10-08 ENCOUNTER — Telehealth (HOSPITAL_COMMUNITY): Payer: Self-pay | Admitting: *Deleted

## 2023-10-08 ENCOUNTER — Telehealth (HOSPITAL_COMMUNITY): Payer: Self-pay

## 2023-10-08 NOTE — Telephone Encounter (Signed)
Spoke with Dr Roselyn Reef office received letter stating cleared to do a cardiopulomary stress test. Will need clearance letter clearing the patient to return to cardiac rehab at [facility]. Provider fax number [phone].Thayer Headings RN BSN

## 2023-10-08 NOTE — Telephone Encounter (Signed)
Recv'ed clearance letter from Dr. Oleh Genin, printed and placed on Byrd Hesselbach (RN) for review.

## 2023-10-09 ENCOUNTER — Telehealth (HOSPITAL_COMMUNITY): Payer: Self-pay | Admitting: *Deleted

## 2023-10-09 NOTE — Telephone Encounter (Signed)
 Spoke with [employer]. Awaiting clearance note from Dr Roselyn Reef office to return to cardiac rehab. Patient states understanding.Thayer Headings RN BSN

## 2023-10-11 ENCOUNTER — Inpatient Hospital Stay: Payer: Medicare Other | Attending: Hematology & Oncology

## 2023-10-11 ENCOUNTER — Encounter: Payer: Self-pay | Admitting: Hematology & Oncology

## 2023-10-11 ENCOUNTER — Inpatient Hospital Stay: Payer: Medicare Other | Admitting: Hematology & Oncology

## 2023-10-11 VITALS — BP 111/71 | HR 99 | Temp 97.6°F | Resp 20 | Ht 71.0 in | Wt 232.0 lb

## 2023-10-11 DIAGNOSIS — I2699 Other pulmonary embolism without acute cor pulmonale: Secondary | ICD-10-CM

## 2023-10-11 DIAGNOSIS — Z7901 Long term (current) use of anticoagulants: Secondary | ICD-10-CM

## 2023-10-11 DIAGNOSIS — I2693 Single subsegmental pulmonary embolism without acute cor pulmonale: Secondary | ICD-10-CM | POA: Insufficient documentation

## 2023-10-11 DIAGNOSIS — G4733 Obstructive sleep apnea (adult) (pediatric): Secondary | ICD-10-CM

## 2023-10-11 DIAGNOSIS — Z950 Presence of cardiac pacemaker: Secondary | ICD-10-CM | POA: Insufficient documentation

## 2023-10-11 DIAGNOSIS — R5383 Other fatigue: Secondary | ICD-10-CM | POA: Insufficient documentation

## 2023-10-11 DIAGNOSIS — I509 Heart failure, unspecified: Secondary | ICD-10-CM | POA: Insufficient documentation

## 2023-10-11 DIAGNOSIS — Z7289 Other problems related to lifestyle: Secondary | ICD-10-CM

## 2023-10-11 DIAGNOSIS — R0602 Shortness of breath: Secondary | ICD-10-CM

## 2023-10-11 DIAGNOSIS — I428 Other cardiomyopathies: Secondary | ICD-10-CM

## 2023-10-11 DIAGNOSIS — E119 Type 2 diabetes mellitus without complications: Secondary | ICD-10-CM | POA: Insufficient documentation

## 2023-10-11 DIAGNOSIS — Z885 Allergy status to narcotic agent status: Secondary | ICD-10-CM | POA: Diagnosis not present

## 2023-10-11 DIAGNOSIS — Z8249 Family history of ischemic heart disease and other diseases of the circulatory system: Secondary | ICD-10-CM | POA: Insufficient documentation

## 2023-10-11 DIAGNOSIS — M7989 Other specified soft tissue disorders: Secondary | ICD-10-CM | POA: Insufficient documentation

## 2023-10-11 DIAGNOSIS — R002 Palpitations: Secondary | ICD-10-CM | POA: Diagnosis not present

## 2023-10-11 DIAGNOSIS — Z79899 Other long term (current) drug therapy: Secondary | ICD-10-CM | POA: Diagnosis not present

## 2023-10-11 DIAGNOSIS — I2609 Other pulmonary embolism with acute cor pulmonale: Secondary | ICD-10-CM

## 2023-10-11 DIAGNOSIS — I82403 Acute embolism and thrombosis of unspecified deep veins of lower extremity, bilateral: Secondary | ICD-10-CM

## 2023-10-11 DIAGNOSIS — Z9089 Acquired absence of other organs: Secondary | ICD-10-CM | POA: Diagnosis not present

## 2023-10-11 LAB — CMP (CANCER CENTER ONLY)
ALT: 16 U/L (ref 0–44)
AST: 15 U/L (ref 15–41)
Albumin: 3.8 g/dL (ref 3.5–5.0)
Alkaline Phosphatase: 100 U/L (ref 38–126)
Anion gap: 10 (ref 5–15)
BUN: 23 mg/dL (ref 8–23)
CO2: 25 mmol/L (ref 22–32)
Calcium: 8.8 mg/dL — ABNORMAL LOW (ref 8.9–10.3)
Chloride: 107 mmol/L (ref 98–111)
Creatinine: 1.4 mg/dL — ABNORMAL HIGH (ref 0.61–1.24)
GFR, Estimated: 52 mL/min — ABNORMAL LOW (ref >60)
Glucose, Bld: 126 mg/dL — ABNORMAL HIGH (ref 70–99)
Potassium: 4.4 mmol/L (ref 3.5–5.1)
Sodium: 142 mmol/L (ref 135–145)
Total Bilirubin: 0.6 mg/dL (ref 0.0–1.2)
Total Protein: 6.4 g/dL — ABNORMAL LOW (ref 6.5–8.1)

## 2023-10-11 LAB — CBC WITH DIFFERENTIAL (CANCER CENTER ONLY)
Abs Immature Granulocytes: 0.04 10*3/uL (ref 0.00–0.07)
Basophils Absolute: 0 10*3/uL (ref 0.0–0.1)
Basophils Relative: 0 %
Eosinophils Absolute: 0.1 10*3/uL (ref 0.0–0.5)
Eosinophils Relative: 1 %
HCT: 42.2 % (ref 39.0–52.0)
Hemoglobin: 13.5 g/dL (ref 13.0–17.0)
Immature Granulocytes: 0 %
Lymphocytes Relative: 23 %
Lymphs Abs: 2.4 10*3/uL (ref 0.7–4.0)
MCH: 29.3 pg (ref 26.0–34.0)
MCHC: 32 g/dL (ref 30.0–36.0)
MCV: 91.5 fL (ref 80.0–100.0)
Monocytes Absolute: 0.7 10*3/uL (ref 0.1–1.0)
Monocytes Relative: 7 %
Neutro Abs: 6.9 10*3/uL (ref 1.7–7.7)
Neutrophils Relative %: 69 %
Platelet Count: 262 10*3/uL (ref 150–400)
RBC: 4.61 MIL/uL (ref 4.22–5.81)
RDW: 15.2 % (ref 11.5–15.5)
WBC Count: 10.1 10*3/uL (ref 4.0–10.5)
nRBC: 0 % (ref 0.0–0.2)

## 2023-10-11 LAB — FERRITIN: Ferritin: 17 ng/mL — ABNORMAL LOW (ref 24–336)

## 2023-10-11 NOTE — Progress Notes (Signed)
 Referral MD  Reason for Referral: Bilateral pulmonary emboli-segmental and subsegmental ; bilateral lower extremity DVT ; congestive heart failure   Chief Complaint  Patient presents with   New Patient (Initial Visit)    I had blood clots and doctor wants to check for coagulation problem  : I have blood clots in my lung and legs.  HPI: Steven Beard is incredibly interesting 78 year old white male.  He comes in with his wife.  He has a history of congestive heart failure.  He is followed by the heart failure clinic.  I think his last ejection fraction he said was in the 20s.  He and his wife have a house up in [place]  that they go to the summertime.  In June, they drove up there.  They pretty much going nonstop.  When he got up there, he has some more shortness of breath.  He did not note any leg swelling.  This worsened.  He then went to the [place] clinic.  He underwent a CT angiogram of the chest which showed segmental and subsegmental pulmonary emboli bilaterally.  He also had Dopplers of his legs.  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  He was placed ultimately, on Eliquis .  He is followed by Dr. Alveta of Cardiology.  Dr. Alveta felt that he needed to have hematology evaluation.  There is no one who has a history of blood clots in his family.  He has never smoked.  I think has rare alcohol use.  He has had past surgeries without any difficulty.  He has never had COVID.  He does have diabetes.  He is on Ozempic and Jardiance .  He has not had any follow-up for his thromboembolism since he was diagnosed back in June.  He does have a pacemaker.  I think this was placed while he was up in [place]  this past Summer.  Overall, I would say that his performance status is probably ECOG 1-2.    Past Medical History:  Diagnosis Date   Hidradenitis    History of BPH    benign prostatic hypertrophy   History of hydrocele    in right testicle     Hypothyroidism    NICM (nonischemic cardiomyopathy) (HCC)    OSA (obstructive sleep apnea) 12/08/2020   Severe OSA with an AHI of 47.3/hr with mild central sleep apnea with pAHIc of 6.7/hr and nocturnal hypoxemia with a minimum O2 sat of 78% and < 88% for 15 minutes c/w nocturnal hypoxemia.   uses cpap nightly   S/P gastric bypass   :   Past Surgical History:  Procedure Laterality Date   CARDIAC CATHETERIZATION  05/19/2020   CARDIOVASCULAR STRESS TEST     CARDIOLYTE ST LATE 2000's AT [facility] OFFICE REPORTEDLY NEGATIVE FOR ISCHEMIA   ECHO DOPPLER COMPLETE(TRANSTHOR) ([facility] HX)     11/2010 HT ECHO W/COLORFLOW/SPECTRAL ([facility] HISTORICAL RESULT) LVEF 35%, GLOBAL HK, MILD MR/TR/AR, 2013 STABLE LVEF 35% GLOBAL HK, NO CHANGE C/W 2012, LVEF 40% WITH GLOBAL HK; TRIVIAL MR/AR, NO EFFUSION   GASTRIC BYPASS     hx   HYDRADENITIS EXCISION Left 12/30/2020   Procedure: EXCISION HIDRADENITIS LEFT INNER THIGH;  Surgeon: Vernetta Berg, MD;  Location: [facility] OR;  Service: General;  Laterality: Left;   HYDROCELE EXCISION  2012   REPAIR   RIGHT/LEFT HEART CATH AND CORONARY ANGIOGRAPHY N/A 05/19/2020   Procedure: RIGHT/LEFT HEART CATH AND CORONARY ANGIOGRAPHY;  Surgeon: Dann Candyce RAMAN, MD;  Location: [facility] INVASIVE [facility];  Service:  Cardiovascular;  Laterality: N/A;   THYROIDECTOMY    :   Current Outpatient Medications:    acetaminophen  (TYLENOL ) 500 MG tablet, Take 1,000 mg by mouth every 6 (six) hours as needed for moderate pain or headache., Disp: , Rfl:    ALPRAZolam (XANAX) 1 MG tablet, Take 1 mg by mouth at bedtime., Disp: , Rfl:    apixaban  (ELIQUIS ) 2.5 MG TABS tablet, Take 2.5 mg by mouth 2 (two) times daily., Disp: , Rfl:    Ascorbic Acid (VITAMIN C PO), Take by mouth daily., Disp: , Rfl:    buPROPion (WELLBUTRIN XL) 150 MG 24 hr tablet, Take 150 mg by mouth daily., Disp: , Rfl:    buPROPion (WELLBUTRIN XL) 300 MG 24 hr tablet, Take 300 mg by mouth daily., Disp: , Rfl:    Calcium Citrate-Vitamin D  (CALCIUM CITRATE + PO), Take by mouth daily., Disp: , Rfl:    carvedilol  (COREG ) 3.125 MG tablet, TAKE 1 TABLET BY MOUTH TWICE  DAILY WITH MEALS, Disp: 180 tablet, Rfl: 0   Cholecalciferol (VITAMIN D3) 50 MCG (2000 UT) capsule, Take 1 capsule by mouth every morning., Disp: , Rfl:    cyanocobalamin (,VITAMIN B-12,) 1000 MCG/ML injection, Inject 1,000 mcg into the muscle every 30 (thirty) days., Disp: , Rfl:    finasteride (PROSCAR) 5 MG tablet, Take 5 mg by mouth at bedtime., Disp: , Rfl:    fluorouracil (EFUDEX) 5 % cream, Apply topically 2 (two) times daily., Disp: , Rfl:    JARDIANCE  10 MG TABS tablet, Take 10 mg by mouth daily., Disp: , Rfl:    levothyroxine (SYNTHROID, LEVOTHROID) 175 MCG tablet, Take 175 mcg by mouth See admin instructions. Take Monday - Saturday, Disp: , Rfl:    midodrine  (PROAMATINE ) 2.5 MG tablet, Take 2 tablets (5 mg total) by mouth 3 (three) times daily with meals. Pt needs to keep upcoming appt in Oct for further refills, Disp: 270 tablet, Rfl: 2   Multiple Vitamins-Minerals (PRESERVISION AREDS) TABS, Take 1 tablet by mouth 2 (two) times daily. , Disp: , Rfl:    OZEMPIC, 0.25 OR 0.5 MG/DOSE, 2 MG/3ML SOPN, Inject 0.5 mg into the skin once a week., Disp: , Rfl:    tamsulosin (FLOMAX) 0.4 MG CAPS capsule, Take 0.4 mg by mouth at bedtime., Disp: , Rfl:    traMADol  (ULTRAM ) 50 MG tablet, Take 1 tablet (50 mg total) by mouth every 6 (six) hours as needed for moderate pain or severe pain., Disp: 25 tablet, Rfl: 0   ACCU-CHEK GUIDE test strip, 1 (ONE) STRIP IN VITRO TWO TIMES DAILY (Patient not taking: Reported on 10/11/2023), Disp: , Rfl:    Accu-Chek Softclix Lancets lancets, SMARTSIG:Topical (Patient not taking: Reported on 10/11/2023), Disp: , Rfl:    Blood Glucose Monitoring Suppl (ACCU-CHEK GUIDE ME) w/Device KIT, 1 (ONE) KIT AS DIRECETD (Patient not taking: Reported on 10/11/2023), Disp: , Rfl:    HYDROcodone -acetaminophen  (NORCO/VICODIN) 5-325 MG tablet, Take 1 tablet by mouth  every 4 (four) hours as needed. (Patient not taking: Reported on 10/11/2023), Disp: 10 tablet, Rfl: 0   ondansetron  (ZOFRAN -ODT) 4 MG disintegrating tablet, Take 1 tablet by mouth 2 (two) times daily as needed. (Patient not taking: Reported on 10/11/2023), Disp: , Rfl: :  :   Allergies  Allergen Reactions   Minocycline Hives   Codeine Itching  :   Family History  Problem Relation Age of Onset   Heart disease Father        family hx; unsure what side   :  Social History   Socioeconomic History   Marital status: Married    Spouse name: Not on file   Number of children: Not on file   Years of education: Not on file   Highest education level: Not on file  Occupational History   Not on file  Tobacco Use   Smoking status: Never    Passive exposure: Never   Smokeless tobacco: Never  Vaping Use   Vaping status: Never Used  Substance and Sexual Activity   Alcohol use: Yes    Alcohol/week: 4.0 standard drinks of alcohol    Types: 4 Glasses of wine per week   Drug use: Never   Sexual activity: Not Currently  Other Topics Concern   Not on file  Social History Narrative   Not on file   Social Drivers of Health   Financial Resource Strain: Low Risk  (04/03/2023)   Received from [place], [employer]    Do you have any trouble paying for your medications, or do you think you might in the future?: No    Does your family have trouble paying for medicine? (Household - for ages 0-17 years): Not on file  Food Insecurity: Unknown (04/03/2023)   Received from [place], [facility]   Hunger Vital Sign    Worried About Programme Researcher, Broadcasting/film/video in the Last Year: Never true    Ran Out of Food in the Last Year: Not on file  Transportation Needs: No Transportation Needs (04/03/2023)   Received from [employer], [employer] Needs    Do you have trouble getting a ride to medical visits or work? (Adult - for ages 18 years and over): Not on file    Does your  family have a hard time getting a ride to doctors' visits? (Household - for ages 0-17 years): Not on file    Has lack of transportation kept you from medical appointments, meetings, work, or from getting things needed for daily living? Check all that apply.: No    Do you (or your family) have trouble finding or paying for a ride (transportation)? (Household - for ages 0-17 years): Not on file  Physical Activity: Insufficiently Active (10/11/2023)   Exercise Vital Sign    Days of Exercise per Week: 5 days    Minutes of Exercise per Session: 10 min  Stress: No Stress Concern Present (10/11/2023)   [employer]ational [employer] - Occupational Stress Questionnaire    Feeling of Stress : Not at all  Social Connections: Socially Integrated (04/03/2023)   Received from [place], [place]   Social Connections    How often do you feel lonely or isolated from those around you?: Rarely  Intimate Partner Violence: Not At Risk (10/11/2023)   Humiliation, Afraid, Rape, and Kick questionnaire    Fear of Current or Ex-Partner: No    Emotionally Abused: No    Physically Abused: No    Sexually Abused: No  :  Review of Systems  Constitutional:  Positive for malaise/fatigue.  HENT: Negative.    Eyes: Negative.   Respiratory:  Positive for shortness of breath.   Cardiovascular:  Positive for palpitations and leg swelling.  Gastrointestinal: Negative.   Genitourinary: Negative.   Musculoskeletal: Negative.   Skin: Negative.   Neurological: Negative.   Endo/Heme/Allergies: Negative.   Psychiatric/Behavioral: Negative.       Exam: Temperature 97.6.  Pulse 99.  Blood pressure 111/71.  Weight is 232 pounds.  @IPVITALS @ Physical Exam Vitals reviewed.  HENT:     Head: Normocephalic and atraumatic.  Eyes:     Pupils: Pupils are equal, round, and reactive to light.  Cardiovascular:     Rate and Rhythm: Normal rate and regular rhythm.     Heart sounds: Normal heart sounds.  Pulmonary:      Effort: Pulmonary effort is normal.     Breath sounds: Normal breath sounds.  Abdominal:     General: Bowel sounds are normal.     Palpations: Abdomen is soft.  Musculoskeletal:        General: No tenderness or deformity. Normal range of motion.     Cervical back: Normal range of motion.  Lymphadenopathy:     Cervical: No cervical adenopathy.  Skin:    General: Skin is warm and dry.     Findings: No erythema or rash.  Neurological:     Mental Status: He is alert and oriented to person, place, and time.  Psychiatric:        Behavior: Behavior normal.        Thought Content: Thought content normal.        Judgment: Judgment normal.     Recent Labs    10/11/23 1323  WBC 10.1  HGB 13.5  HCT 42.2  PLT 262    Recent Labs    10/11/23 1323  NA 142  K 4.4  CL 107  CO2 25  GLUCOSE 126*  BUN 23  CREATININE 1.40*  CALCIUM 8.8*    Blood smear review: None  Pathology: None    Assessment and Plan: Steven Beard is a very nice 78 year old white male.  He has a history of congestive heart failure.  Clearly, this is gone he is a long-term problem.  He has a thromboembolic disease.  I have to believe that the thromboembolism is from his congestive heart failure.  It would not surprise me if he had transient episodes of atrial fibrillation.  I am not going to put him through a hypercoagulable workup.  I 78 years old, with no family history of thromboembolism, I really do not think that he is going to have a hypercoagulable state.  He is going need lifelong anticoagulation in my mind.  Again, with his congestive heart failure, he is at risk for thromboembolism.  I had a nice long talk with he and his wife.  I explained my recommendations.  He understands this.  We do need to follow-up with his CT angiogram of the chest and Dopplers of his legs.  I will get these set up in 1 week.  I would like to see him back in about 6 weeks for follow-up.

## 2023-10-12 ENCOUNTER — Telehealth (HOSPITAL_COMMUNITY): Payer: Self-pay

## 2023-10-12 ENCOUNTER — Encounter (HOSPITAL_COMMUNITY): Payer: Medicare Other

## 2023-10-12 LAB — IRON AND IRON BINDING CAPACITY (CC-WL,HP ONLY)
Iron: 66 ug/dL (ref 45–182)
Saturation Ratios: 20 % (ref 17.9–39.5)
TIBC: 336 ug/dL (ref 250–450)
UIBC: 270 ug/dL (ref 117–376)

## 2023-10-12 LAB — ERYTHROPOIETIN: Erythropoietin: 23.6 m[IU]/mL — ABNORMAL HIGH (ref 2.6–18.5)

## 2023-10-12 NOTE — Telephone Encounter (Signed)
 Pt stated he was returning [facility] phone call, was unable to reach Glen Nazar at her desk.   Did check fax and adv pt we have recv'ed his clearance letter to return to [facility] on 10/15/23.

## 2023-10-15 ENCOUNTER — Encounter (HOSPITAL_COMMUNITY)
Admission: RE | Admit: 2023-10-15 | Discharge: 2023-10-15 | Disposition: A | Discharge status: Home / Self Care | Payer: Medicare Other | Source: Ambulatory Visit | Attending: Cardiovascular Disease | Admitting: Cardiovascular Disease

## 2023-10-15 DIAGNOSIS — I5022 Chronic systolic (congestive) heart failure: Secondary | ICD-10-CM | POA: Insufficient documentation

## 2023-10-15 DIAGNOSIS — Z5189 Encounter for other specified aftercare: Secondary | ICD-10-CM | POA: Diagnosis present

## 2023-10-15 LAB — GLUCOSE, CAPILLARY
Glucose-Capillary: 132 mg/dL — ABNORMAL HIGH (ref 70–99)
Glucose-Capillary: 152 mg/dL — ABNORMAL HIGH (ref 70–99)

## 2023-10-15 NOTE — Progress Notes (Signed)
 Jasen returned to cardiac rehab per his PCP and exercised without difficulty. Medication changes noted. Thayer Headings RN BSN

## 2023-10-15 NOTE — Progress Notes (Signed)
 Cardiac Individual Treatment Plan  Patient Details  Name: Steven Beard MRN: [id number] Date of Birth: [birth year] Referring Provider:   Flowsheet Row INTENSIVE CARDIAC REHAB ORIENT from 09/03/2023 in [facility]  Referring Provider Aleene Passe, MD       Initial Encounter Date:  Flowsheet Row INTENSIVE CARDIAC REHAB ORIENT from 09/03/2023 in [facility]  Date 09/03/23       Visit Diagnosis: Heart failure, chronic systolic (HCC)  Patient's Home Medications on Admission:  Current Outpatient Medications:    ACCU-CHEK GUIDE test strip, 1 (ONE) STRIP IN VITRO TWO TIMES DAILY (Patient not taking: Reported on 10/11/2023), Disp: , Rfl:    Accu-Chek Softclix Lancets lancets, SMARTSIG:Topical (Patient not taking: Reported on 10/11/2023), Disp: , Rfl:    acetaminophen  (TYLENOL ) 500 MG tablet, Take 1,000 mg by mouth every 6 (six) hours as needed for moderate pain or headache., Disp: , Rfl:    ALPRAZolam (XANAX) 1 MG tablet, Take 1 mg by mouth at bedtime., Disp: , Rfl:    apixaban  (ELIQUIS ) 2.5 MG TABS tablet, Take 2.5 mg by mouth 2 (two) times daily., Disp: , Rfl:    Ascorbic Acid (VITAMIN C PO), Take by mouth daily., Disp: , Rfl:    Blood Glucose Monitoring Suppl (ACCU-CHEK GUIDE ME) w/Device KIT, 1 (ONE) KIT AS DIRECETD (Patient not taking: Reported on 10/11/2023), Disp: , Rfl:    buPROPion (WELLBUTRIN XL) 150 MG 24 hr tablet, Take 150 mg by mouth daily., Disp: , Rfl:    buPROPion (WELLBUTRIN XL) 300 MG 24 hr tablet, Take 300 mg by mouth daily. Patient says dose was increased to 450 mg once a day, Disp: , Rfl:    Calcium Citrate-Vitamin D (CALCIUM CITRATE + PO), Take by mouth daily., Disp: , Rfl:    carvedilol  (COREG ) 3.125 MG tablet, TAKE 1 TABLET BY MOUTH TWICE  DAILY WITH MEALS, Disp: 180 tablet, Rfl: 0   Cholecalciferol (VITAMIN D3) 50 MCG (2000 UT) capsule, Take 1 capsule by  mouth every morning., Disp: , Rfl:    cyanocobalamin (,VITAMIN B-12,) 1000 MCG/ML injection, Inject 1,000 mcg into the muscle every 30 (thirty) days., Disp: , Rfl:    finasteride (PROSCAR) 5 MG tablet, Take 5 mg by mouth at bedtime., Disp: , Rfl:    fluorouracil (EFUDEX) 5 % cream, Apply topically 2 (two) times daily., Disp: , Rfl:    HYDROcodone -acetaminophen  (NORCO/VICODIN) 5-325 MG tablet, Take 1 tablet by mouth every 4 (four) hours as needed. (Patient not taking: Reported on 10/11/2023), Disp: 10 tablet, Rfl: 0   JARDIANCE  10 MG TABS tablet, Take 10 mg by mouth daily., Disp: , Rfl:    levothyroxine (SYNTHROID, LEVOTHROID) 175 MCG tablet, Take 175 mcg by mouth See admin instructions. Take Monday - Saturday, Disp: , Rfl:    midodrine  (PROAMATINE ) 2.5 MG tablet, Take 2 tablets (5 mg total) by mouth 3 (three) times daily with meals. Pt needs to keep upcoming appt in Oct for further refills, Disp: 270 tablet, Rfl: 2   Multiple Vitamins-Minerals (PRESERVISION AREDS) TABS, Take 1 tablet by mouth 2 (two) times daily. , Disp: , Rfl:    ondansetron  (ZOFRAN -ODT) 4 MG disintegrating tablet, Take 1 tablet by mouth 2 (two) times daily as needed. (Patient not taking: Reported on 10/11/2023), Disp: , Rfl:    OZEMPIC, 0.25 OR 0.5 MG/DOSE, 2 MG/3ML SOPN, Inject 0.5 mg into the skin once a week., Disp: , Rfl:  tamsulosin (FLOMAX) 0.4 MG CAPS capsule, Take 0.4 mg by mouth at bedtime., Disp: , Rfl:    traMADol  (ULTRAM ) 50 MG tablet, Take 1 tablet (50 mg total) by mouth every 6 (six) hours as needed for moderate pain or severe pain., Disp: 25 tablet, Rfl: 0  Past Medical History: Past Medical History:  Diagnosis Date   Hidradenitis    History of BPH    benign prostatic hypertrophy   History of hydrocele    in right testicle    Hypothyroidism    NICM (nonischemic cardiomyopathy) (HCC)    OSA (obstructive sleep apnea) 12/08/2020   Severe OSA with an AHI of 47.3/hr with mild central sleep apnea with pAHIc of 6.7/hr  and nocturnal hypoxemia with a minimum O2 sat of 78% and < 88% for 15 minutes c/w nocturnal hypoxemia.   uses cpap nightly   S/P gastric bypass     Tobacco Use: Social History   Tobacco Use  Smoking Status Never   Passive exposure: Never  Smokeless Tobacco Never    Labs: Review Flowsheet       Latest Ref Rng & Units 05/19/2020  Labs for ITP Cardiac and Pulmonary Rehab  PH, Arterial 7.350 - 7.450 7.383   PCO2 arterial 32.0 - 48.0 mmHg 42.6   Bicarbonate 20.0 - 28.0 mmol/L 26.9  25.3   TCO2 22 - 32 mmol/L 28  27   O2 Saturation % 66.0  90.0     Details       Multiple values from one day are sorted in reverse-chronological order         Capillary Blood Glucose: Lab Results  Component Value Date   GLUCAP 94 09/12/2023   GLUCAP 109 (H) 09/12/2023   GLUCAP 108 (H) 09/10/2023   GLUCAP 117 (H) 09/10/2023   GLUCAP 104 (H) 09/03/2023     Exercise Target Goals: Exercise Program Goal: Individual exercise prescription set using results from initial 6 min walk test and THRR while considering  patient's activity barriers and safety.   Exercise Prescription Goal: Initial exercise prescription builds to 30-45 minutes a day of aerobic activity, 2-3 days per week.  Home exercise guidelines will be given to patient during program as part of exercise prescription that the participant will acknowledge.  Activity Barriers & Risk Stratification:  Activity Barriers & Cardiac Risk Stratification - 09/03/23 1311       Activity Barriers & Cardiac Risk Stratification   Activity Barriers Deconditioning;Decreased Ventricular Function;Balance Concerns;Back Problems    Cardiac Risk Stratification High   <5 METs on            6 Minute Walk:  6 Minute Walk     Row Name 09/03/23 1310         6 Minute Walk   Phase Initial     Distance 600 feet     Walk Time 6 minutes     # of Rest Breaks 3  1:30-2:45, 3:15-4:00, 5:15-6:00 due to over THRR     MPH 1.14     METS 1.07      RPE 8     Perceived Dyspnea  0     VO2 Peak 3.73     Symptoms Yes (comment)     Comments 3 rest breaks due to exceeding THRR, no pain     Resting HR 85 bpm     Resting BP 96/58     Resting Oxygen Saturation  95 %     Exercise Oxygen Saturation  during  6 min walk 96 %     Max Ex. HR 119 bpm     Max Ex. BP 117/60     2 Minute Post BP 113/75              Oxygen Initial Assessment:   Oxygen Re-Evaluation:   Oxygen Discharge (Final Oxygen Re-Evaluation):   Initial Exercise Prescription:  Initial Exercise Prescription - 09/03/23 1300       Date of Initial Exercise RX and Referring Provider   Date 09/03/23    Referring Provider Aleene Passe, MD    Expected Discharge Date 11/21/23      Recumbant Bike   Level 1    RPM 50    Watts 35    Minutes 15    METs 2      Track   Laps 13    Minutes 15    METs 2      Prescription Details   Frequency (times per week) 3    Duration Progress to 30 minutes of continuous aerobic without signs/symptoms of physical distress      Intensity   THRR 40-80% of Max Heartrate 57-114    Ratings of Perceived Exertion 11-13    Perceived Dyspnea 0-4      Progression   Progression Continue progressive overload as per policy without signs/symptoms or physical distress.      Resistance Training   Training Prescription Yes    Weight 3    Reps 10-15             Perform Capillary Blood Glucose checks as needed.  Exercise Prescription Changes:   Exercise Prescription Changes     Row Name 09/10/23 1029 09/12/23 1021 09/17/23 1028 10/15/23 1022       Response to Exercise   Blood Pressure (Admit) 102/68 108/66 120/70 120/70    Blood Pressure (Exercise) 112/66 118/62 120/64 120/68    Blood Pressure (Exit) 92/70 111/73 92/67 92/65     Heart Rate (Admit) 73 bpm 85 bpm 86 bpm 84 bpm    Heart Rate (Exercise) 130 bpm 107 bpm 129 bpm 100 bpm    Heart Rate (Exit) 78 bpm 78 bpm 93 bpm 77 bpm    Rating of Perceived Exertion (Exercise) 11  11 11 11     Symptoms None None Fatigue None    Comments Elevated heart rate with walking. Moved to NuStep. Moved to NuStep for 30 minutes. Modified warm-up to seated so as not to get too tired. Stopped early due to fatigue. --    Duration Progress to 30 minutes of  aerobic without signs/symptoms of physical distress Progress to 30 minutes of  aerobic without signs/symptoms of physical distress Progress to 30 minutes of  aerobic without signs/symptoms of physical distress Progress to 30 minutes of  aerobic without signs/symptoms of physical distress    Intensity THRR unchanged THRR New  Up to 130 bpm. THRR unchanged  Up to 130 bpm. THRR unchanged  Up to 130 bpm.      Progression   Progression Continue to progress workloads to maintain intensity without signs/symptoms of physical distress. Continue to progress workloads to maintain intensity without signs/symptoms of physical distress. Continue to progress workloads to maintain intensity without signs/symptoms of physical distress. Continue to progress workloads to maintain intensity without signs/symptoms of physical distress.    Average METs 2 2.1 2.3 2.4      Resistance Training   Training Prescription Yes No  relaxation day, no weights. Yes Yes  Weight 3 -- 3 lbs 3 lbs    Reps 10-15 -- 10-15 10-15    Time 10 Minutes -- 10 Minutes 10 Minutes      Interval Training   Interval Training No No No No      NuStep   Level 1 1 2 2     SPM 75 93 90 104    Minutes 20 30 15 30     METs 1.9 2.1 2.1 2.4      Track   Laps 4  880 ft -- 10 --    Minutes 7 -- 13 --    METs 2.09 -- 2.47 --             Exercise Comments:   Exercise Comments     Row Name 09/10/23 1136 09/12/23 1021 10/15/23 1123       Exercise Comments Steven Beard tolerated low intensity exercise fair without symptoms. Steven Beard's heart rate was elevated walking the track. Switched to the NuStep. Will continue to monitor. Switched patient to NuStep 30 minutes. Steven Beard returned to  exercise today and tolerated well without symptoms.              Exercise Goals and Review:   Exercise Goals     Row Name 09/03/23 1118             Exercise Goals   Increase Physical Activity Yes       Intervention Provide advice, education, support and counseling about physical activity/exercise needs.;Develop an individualized exercise prescription for aerobic and resistive training based on initial evaluation findings, risk stratification, comorbidities and participant's personal goals.       Expected Outcomes Short Term: Attend rehab on a regular basis to increase amount of physical activity.;Long Term: Exercising regularly at least 3-5 days a week.;Long Term: Add in home exercise to make exercise part of routine and to increase amount of physical activity.       Increase Strength and Stamina Yes       Intervention Provide advice, education, support and counseling about physical activity/exercise needs.;Develop an individualized exercise prescription for aerobic and resistive training based on initial evaluation findings, risk stratification, comorbidities and participant's personal goals.       Expected Outcomes Short Term: Perform resistance training exercises routinely during rehab and add in resistance training at home;Long Term: Improve cardiorespiratory fitness, muscular endurance and strength as measured by increased METs and functional capacity ( );Short Term: Increase workloads from initial exercise prescription for resistance, speed, and METs.       Able to understand and use rate of perceived exertion (RPE) scale Yes       Intervention Provide education and explanation on how to use RPE scale       Expected Outcomes Short Term: Able to use RPE daily in rehab to express subjective intensity level;Long Term:  Able to use RPE to guide intensity level when exercising independently       Knowledge and understanding of Target Heart Rate Range (THRR) Yes       Intervention  Provide education and explanation of THRR including how the numbers were predicted and where they are located for reference       Expected Outcomes Short Term: Able to state/look up THRR;Short Term: Able to use daily as guideline for intensity in rehab;Long Term: Able to use THRR to govern intensity when exercising independently       Understanding of Exercise Prescription Yes       Intervention Provide education, explanation, and written  materials on patient's individual exercise prescription       Expected Outcomes Short Term: Able to explain program exercise prescription;Long Term: Able to explain home exercise prescription to exercise independently                Exercise Goals Re-Evaluation :  Exercise Goals Re-Evaluation     Row Name 09/10/23 1136 09/12/23 1333 09/17/23 1028 10/15/23 1123       Exercise Goal Re-Evaluation   Exercise Goals Review Increase Physical Activity;Increase Strength and Stamina;Able to understand and use rate of perceived exertion (RPE) scale Increase Physical Activity;Increase Strength and Stamina;Able to understand and use rate of perceived exertion (RPE) scale Increase Physical Activity;Increase Strength and Stamina;Able to understand and use rate of perceived exertion (RPE) scale Increase Physical Activity;Increase Strength and Stamina;Able to understand and use rate of perceived exertion (RPE) scale    Comments Steven Beard was able to understand and use RPE scale appropriately. Received clearance to increase THRR  up to 130s. Patient had been on the NuStep for 30 minutes due to elevated heart rate with walking. Received clearance from his cardiologist to increase THRR and resumed walking the track today. He tolerated fair, stopping early due to fatigue. Steven Beard returned to exercise today after being out due to a fall at home on 09/17/23. Tolerated exercise well without symptoms.    Expected Outcomes Increase duration to achieve 30 minutes. Progress workloads as  tolerated to help increase cardiorespiratory fitness. Will progress workloads to keep heart rate within limits. Will continue to progress as tolerated. Progress workloads gradually as tolerated.             Discharge Exercise Prescription (Final Exercise Prescription Changes):  Exercise Prescription Changes - 10/15/23 1022       Response to Exercise   Blood Pressure (Admit) 120/70    Blood Pressure (Exercise) 120/68    Blood Pressure (Exit) 92/65    Heart Rate (Admit) 84 bpm    Heart Rate (Exercise) 100 bpm    Heart Rate (Exit) 77 bpm    Rating of Perceived Exertion (Exercise) 11    Symptoms None    Duration Progress to 30 minutes of  aerobic without signs/symptoms of physical distress    Intensity THRR unchanged   Up to 130 bpm.     Progression   Progression Continue to progress workloads to maintain intensity without signs/symptoms of physical distress.    Average METs 2.4      Resistance Training   Training Prescription Yes    Weight 3 lbs    Reps 10-15    Time 10 Minutes      Interval Training   Interval Training No      NuStep   Level 2    SPM 104    Minutes 30    METs 2.4             Nutrition:  Target Goals: Understanding of nutrition guidelines, daily intake of sodium 1500mg , cholesterol 200mg , calories 30% from fat and 7% or less from saturated fats, daily to have 5 or more servings of fruits and vegetables.  Biometrics:  Pre Biometrics - 09/03/23 1121       Pre Biometrics   Waist Circumference 48 inches    Hip Circumference 53 inches    Waist to Hip Ratio 0.91 %    Triceps Skinfold 27 mm    % Body Fat 35.7 %    Grip Strength 30 kg    Flexibility 0 in  not done due to back problems   Single Leg Stand 1.5 seconds              Nutrition Therapy Plan and Nutrition Goals:   Nutrition Assessments:  MEDIFICTS Score Key: >=70 Need to make dietary changes  40-70 Heart Healthy Diet <= 40 Therapeutic Level Cholesterol Diet     Picture Your Plate Scores: <59 Unhealthy dietary pattern with much room for improvement. 41-50 Dietary pattern unlikely to meet recommendations for good health and room for improvement. 51-60 More healthful dietary pattern, with some room for improvement.  >60 Healthy dietary pattern, although there may be some specific behaviors that could be improved.    Nutrition Goals Re-Evaluation:   Nutrition Goals Re-Evaluation:   Nutrition Goals Discharge (Final Nutrition Goals Re-Evaluation):   Psychosocial: Target Goals: Acknowledge presence or absence of significant depression and/or stress, maximize coping skills, provide positive support system. Participant is able to verbalize types and ability to use techniques and skills needed for reducing stress and depression.  Initial Review & Psychosocial Screening:  Initial Psych Review & Screening - 09/03/23 1131       Initial Review   Current issues with Current Depression;Current Stress Concerns    Source of Stress Concerns Chronic Illness    Comments Steven Beard shared that he has had some depression about his recent decline in heart function. He stated that he has been on Wellbutrin for quite a while, however doesn't feel it is working well. Encouraged to discuss with PCP. Steven Beard denies any need for additional resources, stating at the end of the day, it's up to me to fix.      Family Dynamics   Good Support System? Yes   Steven Beard has his wife for support     Barriers   Psychosocial barriers to participate in program The patient should benefit from training in stress management and relaxation.      Screening Interventions   Interventions Provide feedback about the scores to participant;To provide support and resources with identified psychosocial needs;Encouraged to exercise    Expected Outcomes Short Term goal: Identification and review with participant of any Quality of Life or Depression concerns found by scoring the questionnaire.;Long  Term Goal: Stressors or current issues are controlled or eliminated.;Long Term goal: The participant improves quality of Life and PHQ9 Scores as seen by post scores and/or verbalization of changes             Quality of Life Scores:  Quality of Life - 09/03/23 1143       Quality of Life   Select Quality of Life      Quality of Life Scores   Health/Function Pre 20.07 %    Socioeconomic Pre 24.83 %    Psych/Spiritual Pre 21.86 %    Family Pre 30 %    GLOBAL Pre 22.82 %            Scores of 19 and below usually indicate a poorer quality of life in these areas.  A difference of  2-3 points is a clinically meaningful difference.  A difference of 2-3 points in the total score of the Quality of Life Index has been associated with significant improvement in overall quality of life, self-image, physical symptoms, and general health in studies assessing change in quality of life.  PHQ-9: Review Flowsheet       09/03/2023  Depression screen PHQ 2/9  Decreased Interest 1  Down, Depressed, Hopeless 1  PHQ - 2 Score 2  Altered  sleeping 1  Tired, decreased energy 3  Change in appetite 0  Feeling bad or failure about yourself  0  Trouble concentrating 0  Moving slowly or fidgety/restless 0  Suicidal thoughts 0  PHQ-9 Score 6  Difficult doing work/chores Not difficult at all   Interpretation of Total Score  Total Score Depression Severity:  1-4 = Minimal depression, 5-9 = Mild depression, 10-14 = Moderate depression, 15-19 = Moderately severe depression, 20-27 = Severe depression   Psychosocial Evaluation and Intervention:   Psychosocial Re-Evaluation:  Psychosocial Re-Evaluation     Row Name 09/11/23 0905 09/18/23 1337 10/15/23 1331         Psychosocial Re-Evaluation   Current issues with Current Stress Concerns;Current Depression Current Stress Concerns;Current Depression Current Stress Concerns;Current Depression     Comments Steven Beard did not voice any increased  concerns or stressors on his first day of exercise. Will discuss PHq2-9 in the upcoming week Steven Beard says he is dissatisfied with his health due to his heart failure diagnosis. Steven Beard did not voice any increased concerns or stressors upon return to cardiac rehab on 10/15/23     Expected Outcomes Steven Beard will have controlled or decreased depression/ stress upon completion of cardiac rehab Steven Beard will have controlled or decreased depression/ stress upon completion of cardiac rehab Steven Beard will have controlled or decreased depression/ stress upon completion of cardiac rehab     Interventions Stress management education;Encouraged to attend Cardiac Rehabilitation for the exercise;Relaxation education Stress management education;Encouraged to attend Cardiac Rehabilitation for the exercise;Relaxation education Stress management education;Encouraged to attend Cardiac Rehabilitation for the exercise;Relaxation education     Continue Psychosocial Services  No Follow up required Follow up required by staff Follow up required by staff       Initial Review   Source of Stress Concerns Chronic Illness;Unable to participate in former interests or hobbies;Unable to perform yard/household activities Chronic Illness;Unable to participate in former interests or hobbies;Unable to perform yard/household activities Chronic Illness;Unable to participate in former interests or hobbies;Unable to perform yard/household activities     Comments Will continue to monitor and offer support as needed. Will continue to monitor and offer support as needed. Will continue to monitor and offer support as needed.              Psychosocial Discharge (Final Psychosocial Re-Evaluation):  Psychosocial Re-Evaluation - 10/15/23 1331       Psychosocial Re-Evaluation   Current issues with Current Stress Concerns;Current Depression    Comments Steven Beard did not voice any increased concerns or stressors upon return to cardiac rehab on 10/15/23     Expected Outcomes Steven Beard will have controlled or decreased depression/ stress upon completion of cardiac rehab    Interventions Stress management education;Encouraged to attend Cardiac Rehabilitation for the exercise;Relaxation education    Continue Psychosocial Services  Follow up required by staff      Initial Review   Source of Stress Concerns Chronic Illness;Unable to participate in former interests or hobbies;Unable to perform yard/household activities    Comments Will continue to monitor and offer support as needed.             Vocational Rehabilitation: Provide vocational rehab assistance to qualifying candidates.   Vocational Rehab Evaluation & Intervention:  Vocational Rehab - 09/03/23 1120       Initial Vocational Rehab Evaluation & Intervention   Assessment shows need for Vocational Rehabilitation No   Steven Beard is retired            Education: Education Goals:  Education classes will be provided on a weekly basis, covering required topics. Participant will state understanding/return demonstration of topics presented.     Core Videos: Exercise    Move It!  Clinical staff conducted group or individual video education with verbal and written material and guidebook.  Patient learns the recommended Pritikin exercise program. Exercise with the goal of living a long, healthy life. Some of the health benefits of exercise include controlled diabetes, healthier blood pressure levels, improved cholesterol levels, improved heart and lung capacity, improved sleep, and better body composition. Everyone should speak with their doctor before starting or changing an exercise routine.  Biomechanical Limitations Clinical staff conducted group or individual video education with verbal and written material and guidebook.  Patient learns how biomechanical limitations can impact exercise and how we can mitigate and possibly overcome limitations to have an impactful and balanced exercise  routine.  Body Composition Clinical staff conducted group or individual video education with verbal and written material and guidebook.  Patient learns that body composition (ratio of muscle mass to fat mass) is a key component to assessing overall fitness, rather than body weight alone. Increased fat mass, especially visceral belly fat, can put us  at increased risk for metabolic syndrome, type 2 diabetes, heart disease, and even death. It is recommended to combine diet and exercise (cardiovascular and resistance training) to improve your body composition. Seek guidance from your physician and exercise physiologist before implementing an exercise routine.  Exercise Action Plan Clinical staff conducted group or individual video education with verbal and written material and guidebook.  Patient learns the recommended strategies to achieve and enjoy long-term exercise adherence, including variety, self-motivation, self-efficacy, and positive decision making. Benefits of exercise include fitness, good health, weight management, more energy, better sleep, less stress, and overall well-being.  Medical   Heart Disease Risk Reduction Clinical staff conducted group or individual video education with verbal and written material and guidebook.  Patient learns our heart is our most vital organ as it circulates oxygen, nutrients, white blood cells, and hormones throughout the entire body, and carries waste away. Data supports a plant-based eating plan like the Pritikin Program for its effectiveness in slowing progression of and reversing heart disease. The video provides a number of recommendations to address heart disease.   Metabolic Syndrome and Belly Fat  Clinical staff conducted group or individual video education with verbal and written material and guidebook.  Patient learns what metabolic syndrome is, how it leads to heart disease, and how one can reverse it and keep it from coming back. You have  metabolic syndrome if you have 3 of the following 5 criteria: abdominal obesity, high blood pressure, high triglycerides, low HDL cholesterol, and high blood sugar.  Hypertension and Heart Disease Clinical staff conducted group or individual video education with verbal and written material and guidebook.  Patient learns that high blood pressure, or hypertension, is very common in the [place] . Hypertension is largely due to excessive salt intake, but other important risk factors include being overweight, physical inactivity, drinking too much alcohol, smoking, and not eating enough potassium from fruits and vegetables. High blood pressure is a leading risk factor for heart attack, stroke, congestive heart failure, dementia, kidney failure, and premature death. Long-term effects of excessive salt intake include stiffening of the arteries and thickening of heart muscle and organ damage. Recommendations include ways to reduce hypertension and the risk of heart disease.  Diseases of Our Time - Focusing on Diabetes Clinical staff conducted group  or individual video education with verbal and written material and guidebook.  Patient learns why the best way to stop diseases of our time is prevention, through food and other lifestyle changes. Medicine (such as prescription pills and surgeries) is often only a Band-Aid on the problem, not a long-term solution. Most common diseases of our time include obesity, type 2 diabetes, hypertension, heart disease, and cancer. The Pritikin Program is recommended and has been proven to help reduce, reverse, and/or prevent the damaging effects of metabolic syndrome.  Nutrition   Overview of the Pritikin Eating Plan  Clinical staff conducted group or individual video education with verbal and written material and guidebook.  Patient learns about the Pritikin Eating Plan for disease risk reduction. The Pritikin Eating Plan emphasizes a wide variety of unrefined,  minimally-processed carbohydrates, like fruits, vegetables, whole grains, and legumes. Go, Caution, and Stop food choices are explained. Plant-based and lean animal proteins are emphasized. Rationale provided for low sodium intake for blood pressure control, low added sugars for blood sugar stabilization, and low added fats and oils for coronary artery disease risk reduction and weight management.  Calorie Density  Clinical staff conducted group or individual video education with verbal and written material and guidebook.  Patient learns about calorie density and how it impacts the Pritikin Eating Plan. Knowing the characteristics of the food you choose will help you decide whether those foods will lead to weight gain or weight loss, and whether you want to consume more or less of them. Weight loss is usually a side effect of the Pritikin Eating Plan because of its focus on low calorie-dense foods.  Label Reading  Clinical staff conducted group or individual video education with verbal and written material and guidebook.  Patient learns about the Pritikin recommended label reading guidelines and corresponding recommendations regarding calorie density, added sugars, sodium content, and whole grains.  Dining Out - Part 1  Clinical staff conducted group or individual video education with verbal and written material and guidebook.  Patient learns that restaurant meals can be sabotaging because they can be so high in calories, fat, sodium, and/or sugar. Patient learns recommended strategies on how to positively address this and avoid unhealthy pitfalls.  Facts on Fats  Clinical staff conducted group or individual video education with verbal and written material and guidebook.  Patient learns that lifestyle modifications can be just as effective, if not more so, as many medications for lowering your risk of heart disease. A Pritikin lifestyle can help to reduce your risk of inflammation and atherosclerosis  (cholesterol build-up, or plaque, in the artery walls). Lifestyle interventions such as dietary choices and physical activity address the cause of atherosclerosis. A review of the types of fats and their impact on blood cholesterol levels, along with dietary recommendations to reduce fat intake is also included.  Nutrition Action Plan  Clinical staff conducted group or individual video education with verbal and written material and guidebook.  Patient learns how to incorporate Pritikin recommendations into their lifestyle. Recommendations include planning and keeping personal health goals in mind as an important part of their success.  Healthy Mind-Set    Healthy Minds, Bodies, Hearts  Clinical staff conducted group or individual video education with verbal and written material and guidebook.  Patient learns how to identify when they are stressed. Video will discuss the impact of that stress, as well as the many benefits of stress management. Patient will also be introduced to stress management techniques. The way we think, act,  and feel has an impact on our hearts.  How Our Thoughts Can Heal Our Hearts  Clinical staff conducted group or individual video education with verbal and written material and guidebook.  Patient learns that negative thoughts can cause depression and anxiety. This can result in negative lifestyle behavior and serious health problems. Cognitive behavioral therapy is an effective method to help control our thoughts in order to change and improve our emotional outlook.  Additional Videos:  Exercise    Improving Performance  Clinical staff conducted group or individual video education with verbal and written material and guidebook.  Patient learns to use a non-linear approach by alternating intensity levels and lengths of time spent exercising to help burn more calories and lose more body fat. Cardiovascular exercise helps improve heart health, metabolism, hormonal balance,  blood sugar control, and recovery from fatigue. Resistance training improves strength, endurance, balance, coordination, reaction time, metabolism, and muscle mass. Flexibility exercise improves circulation, posture, and balance. Seek guidance from your physician and exercise physiologist before implementing an exercise routine and learn your capabilities and proper form for all exercise.  Introduction to Yoga  Clinical staff conducted group or individual video education with verbal and written material and guidebook.  Patient learns about yoga, a discipline of the coming together of mind, breath, and body. The benefits of yoga include improved flexibility, improved range of motion, better posture and core strength, increased lung function, weight loss, and positive self-image. Yoga's heart health benefits include lowered blood pressure, healthier heart rate, decreased cholesterol and triglyceride levels, improved immune function, and reduced stress. Seek guidance from your physician and exercise physiologist before implementing an exercise routine and learn your capabilities and proper form for all exercise.  Medical   Aging: Enhancing Your Quality of Life  Clinical staff conducted group or individual video education with verbal and written material and guidebook.  Patient learns key strategies and recommendations to stay in good physical health and enhance quality of life, such as prevention strategies, having an advocate, securing a Health Care Proxy and Power of Attorney, and keeping a list of medications and system for tracking them. It also discusses how to avoid risk for bone loss.  Biology of Weight Control  Clinical staff conducted group or individual video education with verbal and written material and guidebook.  Patient learns that weight gain occurs because we consume more calories than we burn (eating more, moving less). Even if your body weight is normal, you may have higher ratios of fat  compared to muscle mass. Too much body fat puts you at increased risk for cardiovascular disease, heart attack, stroke, type 2 diabetes, and obesity-related cancers. In addition to exercise, following the Pritikin Eating Plan can help reduce your risk.  Decoding Lab Results  Clinical staff conducted group or individual video education with verbal and written material and guidebook.  Patient learns that lab test reflects one measurement whose values change over time and are influenced by many factors, including medication, stress, sleep, exercise, food, hydration, pre-existing medical conditions, and more. It is recommended to use the knowledge from this video to become more involved with your lab results and evaluate your numbers to speak with your doctor.   Diseases of Our Time - Overview  Clinical staff conducted group or individual video education with verbal and written material and guidebook.  Patient learns that according to the CDC, 50% to 70% of chronic diseases (such as obesity, type 2 diabetes, elevated lipids, hypertension, and heart disease) are avoidable  through lifestyle improvements including healthier food choices, listening to satiety cues, and increased physical activity.  Sleep Disorders Clinical staff conducted group or individual video education with verbal and written material and guidebook.  Patient learns how good quality and duration of sleep are important to overall health and well-being. Patient also learns about sleep disorders and how they impact health along with recommendations to address them, including discussing with a physician.  Nutrition  Dining Out - Part 2 Clinical staff conducted group or individual video education with verbal and written material and guidebook.  Patient learns how to plan ahead and communicate in order to maximize their dining experience in a healthy and nutritious manner. Included are recommended food choices based on the type of restaurant  the patient is visiting.   Fueling a Banker conducted group or individual video education with verbal and written material and guidebook.  There is a strong connection between our food choices and our health. Diseases like obesity and type 2 diabetes are very prevalent and are in large-part due to lifestyle choices. The Pritikin Eating Plan provides plenty of food and hunger-curbing satisfaction. It is easy to follow, affordable, and helps reduce health risks.  Menu Workshop  Clinical staff conducted group or individual video education with verbal and written material and guidebook.  Patient learns that restaurant meals can sabotage health goals because they are often packed with calories, fat, sodium, and sugar. Recommendations include strategies to plan ahead and to communicate with the manager, chef, or server to help order a healthier meal.  Planning Your Eating Strategy  Clinical staff conducted group or individual video education with verbal and written material and guidebook.  Patient learns about the Pritikin Eating Plan and its benefit of reducing the risk of disease. The Pritikin Eating Plan does not focus on calories. Instead, it emphasizes high-quality, nutrient-rich foods. By knowing the characteristics of the foods, we choose, we can determine their calorie density and make informed decisions.  Targeting Your Nutrition Priorities  Clinical staff conducted group or individual video education with verbal and written material and guidebook.  Patient learns that lifestyle habits have a tremendous impact on disease risk and progression. This video provides eating and physical activity recommendations based on your personal health goals, such as reducing LDL cholesterol, losing weight, preventing or controlling type 2 diabetes, and reducing high blood pressure.  Vitamins and Minerals  Clinical staff conducted group or individual video education with verbal and written  material and guidebook.  Patient learns different ways to obtain key vitamins and minerals, including through a recommended healthy diet. It is important to discuss all supplements you take with your doctor.   Healthy Mind-Set    Smoking Cessation  Clinical staff conducted group or individual video education with verbal and written material and guidebook.  Patient learns that cigarette smoking and tobacco addiction pose a serious health risk which affects millions of people. Stopping smoking will significantly reduce the risk of heart disease, lung disease, and many forms of cancer. Recommended strategies for quitting are covered, including working with your doctor to develop a successful plan.  Culinary   Becoming a Set Designer conducted group or individual video education with verbal and written material and guidebook.  Patient learns that cooking at home can be healthy, cost-effective, quick, and puts them in control. Keys to cooking healthy recipes will include looking at your recipe, assessing your equipment needs, planning ahead, making it simple, choosing cost-effective seasonal  ingredients, and limiting the use of added fats, salts, and sugars.  Cooking - Breakfast and Snacks  Clinical staff conducted group or individual video education with verbal and written material and guidebook.  Patient learns how important breakfast is to satiety and nutrition through the entire day. Recommendations include key foods to eat during breakfast to help stabilize blood sugar levels and to prevent overeating at meals later in the day. Planning ahead is also a key component.  Cooking - Educational Psychologist conducted group or individual video education with verbal and written material and guidebook.  Patient learns eating strategies to improve overall health, including an approach to cook more at home. Recommendations include thinking of animal protein as a side on your plate  rather than center stage and focusing instead on lower calorie dense options like vegetables, fruits, whole grains, and plant-based proteins, such as beans. Making sauces in large quantities to freeze for later and leaving the skin on your vegetables are also recommended to maximize your experience.  Cooking - Healthy Salads and Dressing Clinical staff conducted group or individual video education with verbal and written material and guidebook.  Patient learns that vegetables, fruits, whole grains, and legumes are the foundations of the Pritikin Eating Plan. Recommendations include how to incorporate each of these in flavorful and healthy salads, and how to create homemade salad dressings. Proper handling of ingredients is also covered. Cooking - Soups and [employer] - Soups and Desserts Clinical staff conducted group or individual video education with verbal and written material and guidebook.  Patient learns that Pritikin soups and desserts make for easy, nutritious, and delicious snacks and meal components that are low in sodium, fat, sugar, and calorie density, while high in vitamins, minerals, and filling fiber. Recommendations include simple and healthy ideas for soups and desserts.   Overview     The Pritikin Solution Program Overview Clinical staff conducted group or individual video education with verbal and written material and guidebook.  Patient learns that the results of the Pritikin Program have been documented in more than 100 articles published in peer-reviewed journals, and the benefits include reducing risk factors for (and, in some cases, even reversing) high cholesterol, high blood pressure, type 2 diabetes, obesity, and more! An overview of the three key pillars of the Pritikin Program will be covered: eating well, doing regular exercise, and having a healthy mind-set.  WORKSHOPS  Exercise: Exercise Basics: Building Your Action Plan Clinical staff led group instruction  and group discussion with PowerPoint presentation and patient guidebook. To enhance the learning environment the use of posters, models and videos may be added. At the conclusion of this workshop, patients will comprehend the difference between physical activity and exercise, as well as the benefits of incorporating both, into their routine. Patients will understand the FITT (Frequency, Intensity, Time, and Type) principle and how to use it to build an exercise action plan. In addition, safety concerns and other considerations for exercise and cardiac rehab will be addressed by the presenter. The purpose of this lesson is to promote a comprehensive and effective weekly exercise routine in order to improve patients' overall level of fitness.   Managing Heart Disease: Your Path to a Healthier Heart Clinical staff led group instruction and group discussion with PowerPoint presentation and patient guidebook. To enhance the learning environment the use of posters, models and videos may be added.At the conclusion of this workshop, patients will understand the anatomy and physiology of the heart.  Additionally, they will understand how Pritikin's three pillars impact the risk factors, the progression, and the management of heart disease.  The purpose of this lesson is to provide a high-level overview of the heart, heart disease, and how the Pritikin lifestyle positively impacts risk factors.  Exercise Biomechanics Clinical staff led group instruction and group discussion with PowerPoint presentation and patient guidebook. To enhance the learning environment the use of posters, models and videos may be added. Patients will learn how the structural parts of their bodies function and how these functions impact their daily activities, movement, and exercise. Patients will learn how to promote a neutral spine, learn how to manage pain, and identify ways to improve their physical movement in order to  promote healthy living. The purpose of this lesson is to expose patients to common physical limitations that impact physical activity. Participants will learn practical ways to adapt and manage aches and pains, and to minimize their effect on regular exercise. Patients will learn how to maintain good posture while sitting, walking, and lifting.  Balance Training and Fall Prevention  Clinical staff led group instruction and group discussion with PowerPoint presentation and patient guidebook. To enhance the learning environment the use of posters, models and videos may be added. At the conclusion of this workshop, patients will understand the importance of their sensorimotor skills (vision, proprioception, and the vestibular system) in maintaining their ability to balance as they age. Patients will apply a variety of balancing exercises that are appropriate for their current level of function. Patients will understand the common causes for poor balance, possible solutions to these problems, and ways to modify their physical environment in order to minimize their fall risk. The purpose of this lesson is to teach patients about the importance of maintaining balance as they age and ways to minimize their risk of falling.  WORKSHOPS   Nutrition:  Fueling a Ship Broker led group instruction and group discussion with PowerPoint presentation and patient guidebook. To enhance the learning environment the use of posters, models and videos may be added. Patients will review the foundational principles of the Pritikin Eating Plan and understand what constitutes a serving size in each of the food groups. Patients will also learn Pritikin-friendly foods that are better choices when away from home and review make-ahead meal and snack options. Calorie density will be reviewed and applied to three nutrition priorities: weight maintenance, weight loss, and weight gain. The purpose of this lesson is to  reinforce (in a group setting) the key concepts around what patients are recommended to eat and how to apply these guidelines when away from home by planning and selecting Pritikin-friendly options. Patients will understand how calorie density may be adjusted for different weight management goals.  Mindful Eating  Clinical staff led group instruction and group discussion with PowerPoint presentation and patient guidebook. To enhance the learning environment the use of posters, models and videos may be added. Patients will briefly review the concepts of the Pritikin Eating Plan and the importance of low-calorie dense foods. The concept of mindful eating will be introduced as well as the importance of paying attention to internal hunger signals. Triggers for non-hunger eating and techniques for dealing with triggers will be explored. The purpose of this lesson is to provide patients with the opportunity to review the basic principles of the Pritikin Eating Plan, discuss the value of eating mindfully and how to measure internal cues of hunger and fullness using the Hunger Scale. Patients will also  discuss reasons for non-hunger eating and learn strategies to use for controlling emotional eating.  Targeting Your Nutrition Priorities Clinical staff led group instruction and group discussion with PowerPoint presentation and patient guidebook. To enhance the learning environment the use of posters, models and videos may be added. Patients will learn how to determine their genetic susceptibility to disease by reviewing their family history. Patients will gain insight into the importance of diet as part of an overall healthy lifestyle in mitigating the impact of genetics and other environmental insults. The purpose of this lesson is to provide patients with the opportunity to assess their personal nutrition priorities by looking at their family history, their own health history and current risk factors. Patients will  also be able to discuss ways of prioritizing and modifying the Pritikin Eating Plan for their highest risk areas  Menu  Clinical staff led group instruction and group discussion with PowerPoint presentation and patient guidebook. To enhance the learning environment the use of posters, models and videos may be added. Using menus brought in from [employer], or printed from [employer], patients will apply the Pritikin dining out guidelines that were presented in the [employer] video. Patients will also be able to practice these guidelines in a variety of provided scenarios. The purpose of this lesson is to provide patients with the opportunity to practice hands-on learning of the Pritikin Dining Out guidelines with actual menus and practice scenarios.  Label Reading Clinical staff led group instruction and group discussion with PowerPoint presentation and patient guidebook. To enhance the learning environment the use of posters, models and videos may be added. Patients will review and discuss the Pritikin label reading guidelines presented in Pritikin's Label Reading Educational series video. Using fool labels brought in from local grocery stores and markets, patients will apply the label reading guidelines and determine if the packaged food meet the Pritikin guidelines. The purpose of this lesson is to provide patients with the opportunity to review, discuss, and practice hands-on learning of the Pritikin Label Reading guidelines with actual packaged food labels. Cooking School  Pritikin's [employer] are designed to teach patients ways to prepare quick, simple, and affordable recipes at home. The importance of nutrition's role in chronic disease risk reduction is reflected in its emphasis in the overall Pritikin program. By learning how to prepare essential core Pritikin Eating Plan recipes, patients will increase control over what they eat; be able to customize the  flavor of foods without the use of added salt, sugar, or fat; and improve the quality of the food they consume. By learning a set of core recipes which are easily assembled, quickly prepared, and affordable, patients are more likely to prepare more healthy foods at home. These workshops focus on convenient breakfasts, simple entres, side dishes, and desserts which can be prepared with minimal effort and are consistent with nutrition recommendations for cardiovascular risk reduction. Cooking [employer] are taught by a armed forces logistics/support/administrative officer (RD) who has been trained by the [employer]. The chef or RD has a clear understanding of the importance of minimizing - if not completely eliminating - added fat, sugar, and sodium in recipes. Throughout the series of Cooking School Workshop sessions, patients will learn about healthy ingredients and efficient methods of cooking to build confidence in their capability to prepare    Cooking School weekly topics:  Adding Flavor- Sodium-Free  Fast and Healthy Breakfasts  Powerhouse Plant-Based Proteins  Satisfying Salads and Dressings  Simple Sides and Sauces  International Cuisine-Spotlight on the [employer] Zones  Delicious Desserts  Savory Soups  [employer] - Meals in a Snap  Tasty Appetizers and Snacks  Comforting Weekend Breakfasts  One-Pot Wonders   Fast Evening Meals  Landscape Architect Your Pritikin Plate  WORKSHOPS   Healthy Mindset (Psychosocial):  Focused Goals, Sustainable Changes Clinical staff led group instruction and group discussion with PowerPoint presentation and patient guidebook. To enhance the learning environment the use of posters, models and videos may be added. Patients will be able to apply effective goal setting strategies to establish at least one personal goal, and then take consistent, meaningful action toward that goal. They will learn to identify common barriers to achieving  personal goals and develop strategies to overcome them. Patients will also gain an understanding of how our mind-set can impact our ability to achieve goals and the importance of cultivating a positive and growth-oriented mind-set. The purpose of this lesson is to provide patients with a deeper understanding of how to set and achieve personal goals, as well as the tools and strategies needed to overcome common obstacles which may arise along the way.  From Head to Heart: The Power of a Healthy Outlook  Clinical staff led group instruction and group discussion with PowerPoint presentation and patient guidebook. To enhance the learning environment the use of posters, models and videos may be added. Patients will be able to recognize and describe the impact of emotions and mood on physical health. They will discover the importance of self-care and explore self-care practices which may work for them. Patients will also learn how to utilize the 4 C's to cultivate a healthier outlook and better manage stress and challenges. The purpose of this lesson is to demonstrate to patients how a healthy outlook is an essential part of maintaining good health, especially as they continue their cardiac rehab journey.  Healthy Sleep for a Healthy Heart Clinical staff led group instruction and group discussion with PowerPoint presentation and patient guidebook. To enhance the learning environment the use of posters, models and videos may be added. At the conclusion of this workshop, patients will be able to demonstrate knowledge of the importance of sleep to overall health, well-being, and quality of life. They will understand the symptoms of, and treatments for, common sleep disorders. Patients will also be able to identify daytime and nighttime behaviors which impact sleep, and they will be able to apply these tools to help manage sleep-related challenges. The purpose of this lesson is to provide patients with a general  overview of sleep and outline the importance of quality sleep. Patients will learn about a few of the most common sleep disorders. Patients will also be introduced to the concept of "sleep hygiene," and discover ways to self-manage certain sleeping problems through simple daily behavior changes. Finally, the workshop will motivate patients by clarifying the links between quality sleep and their goals of heart-healthy living.   Recognizing and Reducing Stress Clinical staff led group instruction and group discussion with PowerPoint presentation and patient guidebook. To enhance the learning environment the use of posters, models and videos may be added. At the conclusion of this workshop, patients will be able to understand the types of stress reactions, differentiate between acute and chronic stress, and recognize the impact that chronic stress has on their health. They will also be able to apply different coping mechanisms, such as reframing negative self-talk. Patients will have the opportunity to practice a variety of  stress management techniques, such as deep abdominal breathing, progressive muscle relaxation, and/or guided imagery.  The purpose of this lesson is to educate patients on the role of stress in their lives and to provide healthy techniques for coping with it.  Learning Barriers/Preferences:  Learning Barriers/Preferences - 09/03/23 1119       Learning Barriers/Preferences   Learning Barriers Sight   wears glasses   Learning Preferences Audio;Group Instruction;Individual Instruction;Pictoral;Skilled Demonstration;Verbal Instruction;Written Material;Computer/Internet;Video             Education Topics:  Knowledge Questionnaire Score:  Knowledge Questionnaire Score - 09/03/23 1120       Knowledge Questionnaire Score   Pre Score 21/24             Core Components/Risk Factors/Patient Goals at Admission:  Personal Goals and Risk Factors at Admission - 09/03/23 1120        Core Components/Risk Factors/Patient Goals on Admission    Weight Management Yes;Obesity;Weight Loss    Intervention Weight Management: Develop a combined nutrition and exercise program designed to reach desired caloric intake, while maintaining appropriate intake of nutrient and fiber, sodium and fats, and appropriate energy expenditure required for the weight goal.;Weight Management/Obesity: Establish reasonable short term and long term weight goals.;Weight Management: Provide education and appropriate resources to help participant work on and attain dietary goals.;Obesity: Provide education and appropriate resources to help participant work on and attain dietary goals.    Expected Outcomes Short Term: Continue to assess and modify interventions until short term weight is achieved;Long Term: Adherence to nutrition and physical activity/exercise program aimed toward attainment of established weight goal;Understanding recommendations for meals to include 15-35% energy as protein, 25-35% energy from fat, 35-60% energy from carbohydrates, less than 200mg  of dietary cholesterol, 20-35 gm of total fiber daily;Weight Loss: Understanding of general recommendations for a balanced deficit meal plan, which promotes 1-2 lb weight loss per week and includes a negative energy balance of [phone] kcal/d;Understanding of distribution of calorie intake throughout the day with the consumption of 4-5 meals/snacks    Diabetes Yes    Intervention Provide education about signs/symptoms and action to take for hypo/hyperglycemia.;Provide education about proper nutrition, including hydration, and aerobic/resistive exercise prescription along with prescribed medications to achieve blood glucose in normal ranges: Fasting glucose 65-99 mg/dL    Expected Outcomes Short Term: Participant verbalizes understanding of the signs/symptoms and immediate care of hyper/hypoglycemia, proper foot care and importance of medication,  aerobic/resistive exercise and nutrition plan for blood glucose control.;Long Term: Attainment of HbA1C < 7%.    Heart Failure Yes    Intervention Provide a combined exercise and nutrition program that is supplemented with education, support and counseling about heart failure. Directed toward relieving symptoms such as shortness of breath, decreased exercise tolerance, and extremity edema.    Expected Outcomes Long term: Adoption of self-care skills and reduction of barriers for early signs and symptoms recognition and intervention leading to self-care maintenance.;Short term: Daily weights obtained and reported for increase. Utilizing diuretic protocols set by physician.;Short term: Attendance in program 2-3 days a week with increased exercise capacity. Reported lower sodium intake. Reported increased fruit and vegetable intake. Reports medication compliance.;Improve functional capacity of life    Hypertension Yes    Intervention Provide education on lifestyle modifcations including regular physical activity/exercise, weight management, moderate sodium restriction and increased consumption of fresh fruit, vegetables, and low fat dairy, alcohol moderation, and smoking cessation.;Monitor prescription use compliance.    Expected Outcomes Short Term: Continued assessment and intervention until  BP is < 140/32mm HG in hypertensive participants. < 130/28mm HG in hypertensive participants with diabetes, heart failure or chronic kidney disease.;Long Term: Maintenance of blood pressure at goal levels.    Stress Yes    Intervention Offer individual and/or small group education and counseling on adjustment to heart disease, stress management and health-related lifestyle change. Teach and support self-help strategies.;Refer participants experiencing significant psychosocial distress to appropriate mental health specialists for further evaluation and treatment. When possible, include family members and significant others  in education/counseling sessions.    Expected Outcomes Short Term: Participant demonstrates changes in health-related behavior, relaxation and other stress management skills, ability to obtain effective social support, and compliance with psychotropic medications if prescribed.;Long Term: Emotional wellbeing is indicated by absence of clinically significant psychosocial distress or social isolation.             Core Components/Risk Factors/Patient Goals Review:   Goals and Risk Factor Review     Row Name 09/11/23 0910 09/18/23 1339 10/15/23 1336         Core Components/Risk Factors/Patient Goals Review   Personal Goals Review Weight Management/Obesity;Lipids;Stress;Heart Failure;Diabetes;Hypertension Weight Management/Obesity;Lipids;Stress;Heart Failure;Diabetes;Hypertension Weight Management/Obesity;Lipids;Stress;Heart Failure;Diabetes;Hypertension     Review Whalen did fair with exercise for his fitness level. vital signs and CBG's were stable. Steven Beard did exceed his target heart rate when walking the track and was switched to the nustep. Steven Beard is deconditioned. Steven Beard has a follow an appointment with Dr Zenaida on 09/11/23. Steven Beard did fair with exercise for his fitness level. vital signs have bbenstable.  patient is on Midodrine . Steven Beard did receive a  target heart rate increase per Dr Zenaida. Steven Beard remains deconditioned.  Will continue to monitor. Steven Beard returned to exercise at cardiac rehab per his PCP. vital signs and CBG's were stable today.     Expected Outcomes Steven Beard will continue to participate in cardiac rehab for exercise, nutrition and lifestyle modifications. Steven Beard will continue to participate in cardiac rehab for exercise, nutrition and lifestyle modifications. Steven Beard will continue to participate in cardiac rehab for exercise, nutrition and lifestyle modifications.              Core Components/Risk Factors/Patient Goals at Discharge (Final Review):   Goals and Risk Factor Review -  10/15/23 1336       Core Components/Risk Factors/Patient Goals Review   Personal Goals Review Weight Management/Obesity;Lipids;Stress;Heart Failure;Diabetes;Hypertension    Review Steven Beard returned to exercise at cardiac rehab per his PCP. vital signs and CBG's were stable today.    Expected Outcomes Zaid will continue to participate in cardiac rehab for exercise, nutrition and lifestyle modifications.             ITP Comments:  ITP Comments     Row Name 09/03/23 1115 09/11/23 0902 09/18/23 1330 10/15/23 1330     ITP Comments Dr. Wilbert Bihari medical director. Introduction to pritikin education/intensive cardiac rehab. Initial orientation packet reviewed with patient. 30 Day ITP Review. Nickalos started cardiac rehab on 09/10/23. Erric did well with exercise for his fitness level as Misael is somewhat decondtioned. 30 Day ITP Review. Arshan is off to a good start to  exercise for his fitness level 30 Day ITP Review. Hyatt returned to exercise at cardiac rehab after being absent due to a fall. Dameon has been cleared to return to exercise per his PCP             Comments: See ITP Comments

## 2023-10-16 ENCOUNTER — Telehealth: Payer: Self-pay

## 2023-10-16 NOTE — Telephone Encounter (Signed)
 Pt called upset because him and Dr. Nancey agreed to the clinic in [place]  to follow his remotes. Amber called the clinic in [place]  and had the patient released to us .   I apologized to the patient and asked him going forward who would he like to follow his remotes? He states since we now have the remotes in our system we can continue to follow his remotes.   The pt thanked me for listening to him.

## 2023-10-17 ENCOUNTER — Encounter (HOSPITAL_COMMUNITY)
Admission: RE | Admit: 2023-10-17 | Discharge: 2023-10-17 | Disposition: A | Discharge status: Home / Self Care | Payer: Medicare Other | Source: Ambulatory Visit | Attending: Cardiovascular Disease | Admitting: Cardiovascular Disease

## 2023-10-17 DIAGNOSIS — Z5189 Encounter for other specified aftercare: Secondary | ICD-10-CM | POA: Diagnosis not present

## 2023-10-17 DIAGNOSIS — I5022 Chronic systolic (congestive) heart failure: Secondary | ICD-10-CM

## 2023-10-19 ENCOUNTER — Encounter (HOSPITAL_COMMUNITY): Payer: Medicare Other

## 2023-10-20 ENCOUNTER — Ambulatory Visit (HOSPITAL_BASED_OUTPATIENT_CLINIC_OR_DEPARTMENT_OTHER): Payer: Medicare Other

## 2023-10-22 ENCOUNTER — Encounter (HOSPITAL_COMMUNITY)
Admission: RE | Admit: 2023-10-22 | Discharge: 2023-10-22 | Disposition: A | Discharge status: Home / Self Care | Payer: Medicare Other | Source: Ambulatory Visit | Attending: Cardiovascular Disease | Admitting: Cardiovascular Disease

## 2023-10-22 ENCOUNTER — Other Ambulatory Visit: Payer: Self-pay | Admitting: Cardiovascular Disease

## 2023-10-22 DIAGNOSIS — I5022 Chronic systolic (congestive) heart failure: Secondary | ICD-10-CM

## 2023-10-22 DIAGNOSIS — Z5189 Encounter for other specified aftercare: Secondary | ICD-10-CM | POA: Diagnosis not present

## 2023-10-24 ENCOUNTER — Encounter (HOSPITAL_COMMUNITY)
Admission: RE | Admit: 2023-10-24 | Discharge: 2023-10-24 | Disposition: A | Discharge status: Home / Self Care | Payer: Medicare Other | Source: Ambulatory Visit | Attending: Cardiovascular Disease | Admitting: Cardiovascular Disease

## 2023-10-24 DIAGNOSIS — Z5189 Encounter for other specified aftercare: Secondary | ICD-10-CM | POA: Diagnosis not present

## 2023-10-24 DIAGNOSIS — I5022 Chronic systolic (congestive) heart failure: Secondary | ICD-10-CM

## 2023-10-25 ENCOUNTER — Ambulatory Visit (HOSPITAL_BASED_OUTPATIENT_CLINIC_OR_DEPARTMENT_OTHER)
Admission: RE | Admit: 2023-10-25 | Discharge: 2023-10-25 | Disposition: A | Discharge status: Home / Self Care | Payer: Medicare Other | Source: Ambulatory Visit | Attending: Hematology & Oncology | Admitting: Hematology & Oncology

## 2023-10-25 DIAGNOSIS — I2609 Other pulmonary embolism with acute cor pulmonale: Secondary | ICD-10-CM

## 2023-10-25 MED ORDER — IOHEXOL 350 MG/ML SOLN
100.0000 mL | Freq: Once | INTRAVENOUS | Status: AC | PRN
  Administered 2023-10-25: 100 mL via INTRAVENOUS

## 2023-10-26 ENCOUNTER — Encounter (HOSPITAL_COMMUNITY): Payer: Medicare Other

## 2023-10-29 ENCOUNTER — Encounter (HOSPITAL_COMMUNITY)
Admission: RE | Admit: 2023-10-29 | Discharge: 2023-10-29 | Disposition: A | Discharge status: Home / Self Care | Payer: Medicare Other | Source: Ambulatory Visit | Attending: Cardiovascular Disease | Admitting: Cardiovascular Disease

## 2023-10-29 ENCOUNTER — Other Ambulatory Visit (HOSPITAL_COMMUNITY): Payer: Self-pay | Admitting: Neurological Surgery

## 2023-10-29 DIAGNOSIS — D334 Benign neoplasm of spinal cord: Secondary | ICD-10-CM

## 2023-10-29 DIAGNOSIS — Z5189 Encounter for other specified aftercare: Secondary | ICD-10-CM | POA: Diagnosis not present

## 2023-10-29 DIAGNOSIS — I5022 Chronic systolic (congestive) heart failure: Secondary | ICD-10-CM

## 2023-10-31 ENCOUNTER — Encounter (HOSPITAL_COMMUNITY): Payer: Medicare Other

## 2023-11-01 ENCOUNTER — Telehealth (HOSPITAL_COMMUNITY): Payer: Self-pay

## 2023-11-01 NOTE — Telephone Encounter (Signed)
Pt called and stated he will be able to attend CR on 11/02/23. Reschedule pt appt.

## 2023-11-02 ENCOUNTER — Encounter (HOSPITAL_COMMUNITY)
Admission: RE | Admit: 2023-11-02 | Discharge: 2023-11-02 | Disposition: A | Discharge status: Home / Self Care | Payer: Medicare Other | Source: Ambulatory Visit | Attending: Cardiovascular Disease | Admitting: Cardiovascular Disease

## 2023-11-02 ENCOUNTER — Encounter (HOSPITAL_COMMUNITY): Payer: Medicare Other

## 2023-11-02 DIAGNOSIS — Z5189 Encounter for other specified aftercare: Secondary | ICD-10-CM | POA: Diagnosis not present

## 2023-11-02 DIAGNOSIS — I5022 Chronic systolic (congestive) heart failure: Secondary | ICD-10-CM

## 2023-11-05 ENCOUNTER — Encounter (HOSPITAL_COMMUNITY)
Admission: RE | Admit: 2023-11-05 | Discharge: 2023-11-05 | Disposition: A | Discharge status: Home / Self Care | Payer: Medicare Other | Source: Ambulatory Visit | Attending: Cardiovascular Disease | Admitting: Cardiovascular Disease

## 2023-11-05 DIAGNOSIS — I5022 Chronic systolic (congestive) heart failure: Secondary | ICD-10-CM

## 2023-11-05 DIAGNOSIS — Z5189 Encounter for other specified aftercare: Secondary | ICD-10-CM | POA: Diagnosis not present

## 2023-11-07 ENCOUNTER — Encounter (HOSPITAL_COMMUNITY)
Admission: RE | Admit: 2023-11-07 | Discharge: 2023-11-07 | Disposition: A | Discharge status: Home / Self Care | Payer: Medicare Other | Source: Ambulatory Visit | Attending: Cardiovascular Disease | Admitting: Cardiovascular Disease

## 2023-11-07 DIAGNOSIS — I5022 Chronic systolic (congestive) heart failure: Secondary | ICD-10-CM

## 2023-11-07 DIAGNOSIS — Z5189 Encounter for other specified aftercare: Secondary | ICD-10-CM | POA: Diagnosis not present

## 2023-11-07 NOTE — Progress Notes (Signed)
Reviewed home exercise guidelines with Steven Beard including endpoints, temperature precautions, and rate of perceived exertion. Steven Beard is currently walking 10-15 minutes 2-3 days/week as his mode of home exercise. He will increase walking duration as he feels stronger. Steven Beard voices understanding of instructions given.  Steven Pais, MS, ACSM CEP

## 2023-11-09 ENCOUNTER — Encounter (HOSPITAL_COMMUNITY): Payer: Medicare Other

## 2023-11-12 ENCOUNTER — Encounter (HOSPITAL_COMMUNITY)
Admission: RE | Admit: 2023-11-12 | Discharge: 2023-11-12 | Disposition: A | Discharge status: Home / Self Care | Payer: Medicare Other | Source: Ambulatory Visit | Attending: Cardiovascular Disease | Admitting: Cardiovascular Disease

## 2023-11-12 DIAGNOSIS — I5022 Chronic systolic (congestive) heart failure: Secondary | ICD-10-CM | POA: Diagnosis present

## 2023-11-12 DIAGNOSIS — Z5189 Encounter for other specified aftercare: Secondary | ICD-10-CM | POA: Diagnosis present

## 2023-11-12 DIAGNOSIS — I2699 Other pulmonary embolism without acute cor pulmonale: Secondary | ICD-10-CM | POA: Diagnosis present

## 2023-11-12 NOTE — Progress Notes (Signed)
Cardiac Individual Treatment Plan  Patient Details  Name: Steven Beard MRN: [id number] Date of Birth: January 06, 1946 Referring Provider:   Flowsheet Row INTENSIVE CARDIAC REHAB ORIENT from 09/03/2023 in [facility]  Referring Provider Kristeen Miss, MD       Initial Encounter Date:  Flowsheet Row INTENSIVE CARDIAC REHAB ORIENT from 09/03/2023 in [facility] for [facility], V[facility]  Date 09/03/23       Visit Diagnosis: Heart failure, chronic systolic (HCC)  Patient's Home Medications on Admission:  Current Outpatient Medications:    ACCU-CHEK GUIDE test strip, 1 (ONE) STRIP IN VITRO TWO TIMES DAILY (Patient not taking: Reported on 10/11/2023), Disp: , Rfl:    Accu-Chek Softclix Lancets lancets, SMARTSIG:Topical (Patient not taking: Reported on 10/11/2023), Disp: , Rfl:    acetaminophen (TYLENOL) 500 MG tablet, Take 1,000 mg by mouth every 6 (six) hours as needed for moderate pain or headache., Disp: , Rfl:    ALPRAZolam (XANAX) 1 MG tablet, Take 1 mg by mouth at bedtime., Disp: , Rfl:    apixaban (ELIQUIS) 2.5 MG TABS tablet, Take 2.5 mg by mouth 2 (two) times daily., Disp: , Rfl:    Ascorbic Acid (VITAMIN C PO), Take by mouth daily., Disp: , Rfl:    Blood Glucose Monitoring Suppl (ACCU-CHEK GUIDE ME) w/Device KIT, 1 (ONE) KIT AS DIRECETD (Patient not taking: Reported on 10/11/2023), Disp: , Rfl:    buPROPion (WELLBUTRIN XL) 150 MG 24 hr tablet, Take 150 mg by mouth daily., Disp: , Rfl:    buPROPion (WELLBUTRIN XL) 300 MG 24 hr tablet, Take 300 mg by mouth daily. Patient says dose was increased to 450 mg once a day, Disp: , Rfl:    Calcium Citrate-Vitamin D (CALCIUM CITRATE + PO), Take by mouth daily., Disp: , Rfl:    carvedilol (COREG) 3.125 MG tablet, TAKE 1 TABLET BY MOUTH TWICE  DAILY WITH MEALS, Disp: 180 tablet, Rfl: 3   Cholecalciferol (VITAMIN D3) 50 MCG (2000 UT) capsule, Take 1 capsule by  mouth every morning., Disp: , Rfl:    cyanocobalamin (,VITAMIN B-12,) 1000 MCG/ML injection, Inject 1,000 mcg into the muscle every 30 (thirty) days., Disp: , Rfl:    finasteride (PROSCAR) 5 MG tablet, Take 5 mg by mouth at bedtime., Disp: , Rfl:    fluorouracil (EFUDEX) 5 % cream, Apply topically 2 (two) times daily., Disp: , Rfl:    HYDROcodone-acetaminophen (NORCO/VICODIN) 5-325 MG tablet, Take 1 tablet by mouth every 4 (four) hours as needed. (Patient not taking: Reported on 10/11/2023), Disp: 10 tablet, Rfl: 0   JARDIANCE 10 MG TABS tablet, Take 10 mg by mouth daily., Disp: , Rfl:    levothyroxine (SYNTHROID, LEVOTHROID) 175 MCG tablet, Take 175 mcg by mouth See admin instructions. Take Monday - Saturday, Disp: , Rfl:    midodrine (PROAMATINE) 2.5 MG tablet, Take 2 tablets (5 mg total) by mouth 3 (three) times daily with meals. Pt needs to keep upcoming appt in Oct for further refills, Disp: 270 tablet, Rfl: 2   Multiple Vitamins-Minerals (PRESERVISION AREDS) TABS, Take 1 tablet by mouth 2 (two) times daily. , Disp: , Rfl:    ondansetron (ZOFRAN-ODT) 4 MG disintegrating tablet, Take 1 tablet by mouth 2 (two) times daily as needed. (Patient not taking: Reported on 10/11/2023), Disp: , Rfl:    OZEMPIC, 0.25 OR 0.5 MG/DOSE, 2 MG/3ML SOPN, Inject 0.5 mg into the skin once a week., Disp: , Rfl:  tamsulosin (FLOMAX) 0.4 MG CAPS capsule, Take 0.4 mg by mouth at bedtime., Disp: , Rfl:    traMADol (ULTRAM) 50 MG tablet, Take 1 tablet (50 mg total) by mouth every 6 (six) hours as needed for moderate pain or severe pain., Disp: 25 tablet, Rfl: 0  Past Medical History: Past Medical History:  Diagnosis Date   Hidradenitis    History of BPH    benign prostatic hypertrophy   History of hydrocele    in right testicle    Hypothyroidism    NICM (nonischemic cardiomyopathy) (HCC)    OSA (obstructive sleep apnea) 12/08/2020   Severe OSA with an AHI of 47.3/hr with mild central sleep apnea with pAHIc of 6.7/hr  and nocturnal hypoxemia with a minimum O2 sat of 78% and < 88% for 15 minutes c/w nocturnal hypoxemia.   uses cpap nightly   S/P gastric bypass     Tobacco Use: Social History   Tobacco Use  Smoking Status Never   Passive exposure: Never  Smokeless Tobacco Never    Labs: Review Flowsheet       Latest Ref Rng & Units 05/19/2020  Labs for ITP Cardiac and Pulmonary Rehab  PH, Arterial 7.350 - 7.450 7.383   PCO2 arterial 32.0 - 48.0 mmHg 42.6   Bicarbonate 20.0 - 28.0 mmol/L 26.9  25.3   TCO2 22 - 32 mmol/L 28  27   O2 Saturation % 66.0  90.0     Details       Multiple values from one day are sorted in reverse-chronological order         Capillary Blood Glucose: Lab Results  Component Value Date   GLUCAP 152 (H) 10/15/2023   GLUCAP 132 (H) 10/15/2023   GLUCAP 94 09/12/2023   GLUCAP 109 (H) 09/12/2023   GLUCAP 108 (H) 09/10/2023     Exercise Target Goals: Exercise Program Goal: Individual exercise prescription set using results from initial 6 min walk test and THRR while considering  patient's activity barriers and safety.   Exercise Prescription Goal: Initial exercise prescription builds to 30-45 minutes a day of aerobic activity, 2-3 days per week.  Home exercise guidelines will be given to patient during program as part of exercise prescription that the participant will acknowledge.  Activity Barriers & Risk Stratification:  Activity Barriers & Cardiac Risk Stratification - 09/03/23 1311       Activity Barriers & Cardiac Risk Stratification   Activity Barriers Deconditioning;Decreased Ventricular Function;Balance Concerns;Back Problems    Cardiac Risk Stratification High   <5 METs on            6 Minute Walk:  6 Minute Walk     Row Name 09/03/23 1310         6 Minute Walk   Phase Initial     Distance 600 feet     Walk Time 6 minutes     # of Rest Breaks 3  1:30-2:45, 3:15-4:00, 5:15-6:00 due to over THRR     MPH 1.14     METS 1.07      RPE 8     Perceived Dyspnea  0     VO2 Peak 3.73     Symptoms Yes (comment)     Comments 3 rest breaks due to exceeding THRR, no pain     Resting HR 85 bpm     Resting BP 96/58     Resting Oxygen Saturation  95 %     Exercise Oxygen Saturation  during  6 min walk 96 %     Max Ex. HR 119 bpm     Max Ex. BP 117/60     2 Minute Post BP 113/75              Oxygen Initial Assessment:   Oxygen Re-Evaluation:   Oxygen Discharge (Final Oxygen Re-Evaluation):   Initial Exercise Prescription:  Initial Exercise Prescription - 09/03/23 1300       Date of Initial Exercise RX and Referring Provider   Date 09/03/23    Referring Provider Kristeen Miss, MD    Expected Discharge Date 11/21/23      Recumbant Bike   Level 1    RPM 50    Watts 35    Minutes 15    METs 2      Track   Laps 13    Minutes 15    METs 2      Prescription Details   Frequency (times per week) 3    Duration Progress to 30 minutes of continuous aerobic without signs/symptoms of physical distress      Intensity   THRR 40-80% of Max Heartrate 57-114    Ratings of Perceived Exertion 11-13    Perceived Dyspnea 0-4      Progression   Progression Continue progressive overload as per policy without signs/symptoms or physical distress.      Resistance Training   Training Prescription Yes    Weight 3    Reps 10-15             Perform Capillary Blood Glucose checks as needed.  Exercise Prescription Changes:   Exercise Prescription Changes     Row Name 09/10/23 1029 09/12/23 1021 09/17/23 1028 10/15/23 1022 10/29/23 1031     Response to Exercise   Blood Pressure (Admit) 102/68 108/66 120/70 120/70 120/70   Blood Pressure (Exercise) 112/66 118/62 120/64 120/68 120/68   Blood Pressure (Exit) 92/70 111/73 92/67 92/65  92/65   Heart Rate (Admit) 73 bpm 85 bpm 86 bpm 84 bpm 84 bpm   Heart Rate (Exercise) 130 bpm 107 bpm 129 bpm 100 bpm 100 bpm   Heart Rate (Exit) 78 bpm 78 bpm 93 bpm 77 bpm 77  bpm   Rating of Perceived Exertion (Exercise) 11 11 11 11 11    Symptoms None None Fatigue None None   Comments Elevated heart rate with walking. Moved to NuStep. Moved to NuStep for 30 minutes. Modified warm-up to seated so as not to get too tired. Stopped early due to fatigue. -- Increased workload on NuStep today level 3 the last 5 minutes.   Duration Progress to 30 minutes of  aerobic without signs/symptoms of physical distress Progress to 30 minutes of  aerobic without signs/symptoms of physical distress Progress to 30 minutes of  aerobic without signs/symptoms of physical distress Progress to 30 minutes of  aerobic without signs/symptoms of physical distress Progress to 30 minutes of  aerobic without signs/symptoms of physical distress   Intensity THRR unchanged THRR New  Up to 130 bpm. THRR unchanged  Up to 130 bpm. THRR unchanged  Up to 130 bpm. THRR unchanged  Up to 130 bpm.     Progression   Progression Continue to progress workloads to maintain intensity without signs/symptoms of physical distress. Continue to progress workloads to maintain intensity without signs/symptoms of physical distress. Continue to progress workloads to maintain intensity without signs/symptoms of physical distress. Continue to progress workloads to maintain intensity without signs/symptoms of physical distress. Continue to  progress workloads to maintain intensity without signs/symptoms of physical distress.   Average METs 2 2.1 2.3 2.4 2.4     Resistance Training   Training Prescription Yes No  relaxation day, no weights. Yes Yes Yes   Weight 3 -- 3 lbs 3 lbs 3 lbs   Reps 10-15 -- 10-15 10-15 10-15   Time 10 Minutes -- 10 Minutes 10 Minutes 10 Minutes     Interval Training   Interval Training No No No No No     NuStep   Level 1 1 2 2 2    SPM 75 93 90 104 104   Minutes 20 30 15 30 30    METs 1.9 2.1 2.1 2.4 --     Track   Laps 4  880 ft -- 10 -- --   Minutes 7 -- 13 -- --   METs 2.09 -- 2.47 -- --     Row Name 11/12/23 1024             Response to Exercise   Blood Pressure (Admit) 108/64       Blood Pressure (Exit) 112/72       Heart Rate (Admit) 86 bpm       Heart Rate (Exercise) 103 bpm       Heart Rate (Exit) 94 bpm       Rating of Perceived Exertion (Exercise) 11       Symptoms None       Comments Exercising on the NuStep for 20 minutes at level 2 and 10 minutes and level 3.       Duration Progress to 30 minutes of  aerobic without signs/symptoms of physical distress       Intensity THRR unchanged  Up to 130 bpm.         Progression   Progression Continue to progress workloads to maintain intensity without signs/symptoms of physical distress.       Average METs 2.8         Resistance Training   Training Prescription Yes       Weight 3 lbs       Reps 10-15       Time 10 Minutes         Interval Training   Interval Training No         NuStep   Level 3  20 minutes level 2, 10 minutes level 3.       Minutes 30       METs 2.8         Home Exercise Plan   Plans to continue exercise at Home (comment)  Walking       Frequency Add 3 additional days to program exercise sessions.       Initial Home Exercises Provided 11/07/23                Exercise Comments:   Exercise Comments     Row Name 09/10/23 1136 09/12/23 1021 10/15/23 1123 10/17/23 1109 11/07/23 1105   Exercise Comments Deland tolerated low intensity exercise fair without symptoms. Daulton's heart rate was elevated walking the track. Switched to the NuStep. Will continue to monitor. Switched patient to NuStep 30 minutes. Lanson returned to exercise today and tolerated well without symptoms. Reviewed goals with Freida Busman. Reviewed home exercise guidelines and goals with Freida Busman.            Exercise Goals and Review:   Exercise Goals     Row Name 09/03/23 [phone]  Exercise Goals   Increase Physical Activity Yes       Intervention Provide advice, education, support and counseling about physical  activity/exercise needs.;Develop an individualized exercise prescription for aerobic and resistive training based on initial evaluation findings, risk stratification, comorbidities and participant's personal goals.       Expected Outcomes Short Term: Attend rehab on a regular basis to increase amount of physical activity.;Long Term: Exercising regularly at least 3-5 days a week.;Long Term: Add in home exercise to make exercise part of routine and to increase amount of physical activity.       Increase Strength and Stamina Yes       Intervention Provide advice, education, support and counseling about physical activity/exercise needs.;Develop an individualized exercise prescription for aerobic and resistive training based on initial evaluation findings, risk stratification, comorbidities and participant's personal goals.       Expected Outcomes Short Term: Perform resistance training exercises routinely during rehab and add in resistance training at home;Long Term: Improve cardiorespiratory fitness, muscular endurance and strength as measured by increased METs and functional capacity ( );Short Term: Increase workloads from initial exercise prescription for resistance, speed, and METs.       Able to understand and use rate of perceived exertion (RPE) scale Yes       Intervention Provide education and explanation on how to use RPE scale       Expected Outcomes Short Term: Able to use RPE daily in rehab to express subjective intensity level;Long Term:  Able to use RPE to guide intensity level when exercising independently       Knowledge and understanding of Target Heart Rate Range (THRR) Yes       Intervention Provide education and explanation of THRR including how the numbers were predicted and where they are located for reference       Expected Outcomes Short Term: Able to state/look up THRR;Short Term: Able to use daily as guideline for intensity in rehab;Long Term: Able to use THRR to govern intensity  when exercising independently       Understanding of Exercise Prescription Yes       Intervention Provide education, explanation, and written materials on patient's individual exercise prescription       Expected Outcomes Short Term: Able to explain program exercise prescription;Long Term: Able to explain home exercise prescription to exercise independently                Exercise Goals Re-Evaluation :  Exercise Goals Re-Evaluation     Row Name 09/10/23 1136 09/12/23 1333 09/17/23 1028 10/15/23 1123 10/17/23 1109     Exercise Goal Re-Evaluation   Exercise Goals Review Increase Physical Activity;Increase Strength and Stamina;Able to understand and use rate of perceived exertion (RPE) scale Increase Physical Activity;Increase Strength and Stamina;Able to understand and use rate of perceived exertion (RPE) scale Increase Physical Activity;Increase Strength and Stamina;Able to understand and use rate of perceived exertion (RPE) scale Increase Physical Activity;Increase Strength and Stamina;Able to understand and use rate of perceived exertion (RPE) scale Increase Physical Activity;Increase Strength and Stamina;Able to understand and use rate of perceived exertion (RPE) scale   Comments Hawley was able to understand and use RPE scale appropriately. Received clearance to increase THRR  up to 130s. Patient had been on the NuStep for 30 minutes due to elevated heart rate with walking. Received clearance from his cardiologist to increase THRR and resumed walking the track today. He tolerated fair, stopping early due to fatigue. Jakyren returned to exercise today after  being out due to a fall at home on 09/17/23. Tolerated exercise well without symptoms. Jaedyn does feel like he's progressing yet with exercise. He expressed to me that the dynamic aerobic warm-up and stretches that we do tires him and decreases what he's able to do on the NuStep machine. He would like to walk to warm-up instead of the current  warm-up routine, so he has more energy for the NuStep. Will make this change to his exercise routine at his next session on Monday. He is walking 15 minutes, 2-3 day/week at home, but his walking is limited by back pain from spinal stenosis. He states he will be able to increase his walking duration as he gets stronger.   Expected Outcomes Increase duration to achieve 30 minutes. Progress workloads as tolerated to help increase cardiorespiratory fitness. Will progress workloads to keep heart rate within limits. Will continue to progress as tolerated. Progress workloads gradually as tolerated. Jamieson will walk the track to warm-up prior to his first station. As he gets stronger, he will increase his walking duration at home.    Row Name 11/07/23 1105             Exercise Goal Re-Evaluation   Exercise Goals Review Increase Physical Activity;Increase Strength and Stamina;Able to understand and use rate of perceived exertion (RPE) scale;Understanding of Exercise Prescription       Comments Emmitte still feels like exercise in the program is helping him. He says using the NuStep machine, his legs feel stronger. He says exercising here has given him confidence that he can exercise without having a cardiac issue. Reviewed exercise prescription with him, and he continues to walk 10-15 minutes 2-3 days/week. He has increased his level on the NuStep. He was exercising at level 2 for 25 minutes and level 3 for 5 minutes. Today he exercised at level 2 for 10 minutes and level 3 for 20 minutes.       Expected Outcomes Quavon will continue walking at home in addition to progressing workloads at cardiac rehab.                Discharge Exercise Prescription (Final Exercise Prescription Changes):  Exercise Prescription Changes - 11/12/23 1024       Response to Exercise   Blood Pressure (Admit) 108/64    Blood Pressure (Exit) 112/72    Heart Rate (Admit) 86 bpm    Heart Rate (Exercise) 103 bpm    Heart Rate  (Exit) 94 bpm    Rating of Perceived Exertion (Exercise) 11    Symptoms None    Comments Exercising on the NuStep for 20 minutes at level 2 and 10 minutes and level 3.    Duration Progress to 30 minutes of  aerobic without signs/symptoms of physical distress    Intensity THRR unchanged   Up to 130 bpm.     Progression   Progression Continue to progress workloads to maintain intensity without signs/symptoms of physical distress.    Average METs 2.8      Resistance Training   Training Prescription Yes    Weight 3 lbs    Reps 10-15    Time 10 Minutes      Interval Training   Interval Training No      NuStep   Level 3   20 minutes level 2, 10 minutes level 3.   Minutes 30    METs 2.8      Home Exercise Plan   Plans to continue exercise at Home (  comment)   Walking   Frequency Add 3 additional days to program exercise sessions.    Initial Home Exercises Provided 11/07/23             Nutrition:  Target Goals: Understanding of nutrition guidelines, daily intake of sodium 1500mg , cholesterol 200mg , calories 30% from fat and 7% or less from saturated fats, daily to have 5 or more servings of fruits and vegetables.  Biometrics:  Pre Biometrics - 09/03/23 1121       Pre Biometrics   Waist Circumference 48 inches    Hip Circumference 53 inches    Waist to Hip Ratio 0.91 %    Triceps Skinfold 27 mm    % Body Fat 35.7 %    Grip Strength 30 kg    Flexibility 0 in   not done due to back problems   Single Leg Stand 1.5 seconds              Nutrition Therapy Plan and Nutrition Goals:   Nutrition Assessments:  MEDIFICTS Score Key: >=70 Need to make dietary changes  40-70 Heart Healthy Diet <= 40 Therapeutic Level Cholesterol Diet    Picture Your Plate Scores: <95 Unhealthy dietary pattern with much room for improvement. 41-50 Dietary pattern unlikely to meet recommendations for good health and room for improvement. 51-60 More healthful dietary pattern, with  some room for improvement.  >60 Healthy dietary pattern, although there may be some specific behaviors that could be improved.    Nutrition Goals Re-Evaluation:   Nutrition Goals Re-Evaluation:   Nutrition Goals Discharge (Final Nutrition Goals Re-Evaluation):   Psychosocial: Target Goals: Acknowledge presence or absence of significant depression and/or stress, maximize coping skills, provide positive support system. Participant is able to verbalize types and ability to use techniques and skills needed for reducing stress and depression.  Initial Review & Psychosocial Screening:  Initial Psych Review & Screening - 09/03/23 1131       Initial Review   Current issues with Current Depression;Current Stress Concerns    Source of Stress Concerns Chronic Illness    Comments Kellar shared that he has had some depression about his recent decline in heart function. He stated that he has been on Wellbutrin for quite a while, however doesn't feel it is working well. Encouraged to discuss with PCP. Gaelen denies any need for additional resources, stating "at the end of the day, it's up to me to fix".      Family Dynamics   Good Support System? Yes   Burman has his wife for support     Barriers   Psychosocial barriers to participate in program The patient should benefit from training in stress management and relaxation.      Screening Interventions   Interventions Provide feedback about the scores to participant;To provide support and resources with identified psychosocial needs;Encouraged to exercise    Expected Outcomes Short Term goal: Identification and review with participant of any Quality of Life or Depression concerns found by scoring the questionnaire.;Long Term Goal: Stressors or current issues are controlled or eliminated.;Long Term goal: The participant improves quality of Life and PHQ9 Scores as seen by post scores and/or verbalization of changes             Quality of Life  Scores:  Quality of Life - 09/03/23 1143       Quality of Life   Select Quality of Life      Quality of Life Scores   Health/Function Pre 20.07 %  Socioeconomic Pre 24.83 %    Psych/Spiritual Pre 21.86 %    Family Pre 30 %    GLOBAL Pre 22.82 %            Scores of 19 and below usually indicate a poorer quality of life in these areas.  A difference of  2-3 points is a clinically meaningful difference.  A difference of 2-3 points in the total score of the Quality of Life Index has been associated with significant improvement in overall quality of life, self-image, physical symptoms, and general health in studies assessing change in quality of life.  PHQ-9: Review Flowsheet       09/03/2023  Depression screen PHQ 2/9  Decreased Interest 1  Down, Depressed, Hopeless 1  PHQ - 2 Score 2  Altered sleeping 1  Tired, decreased energy 3  Change in appetite 0  Feeling bad or failure about yourself  0  Trouble concentrating 0  Moving slowly or fidgety/restless 0  Suicidal thoughts 0  PHQ-9 Score 6  Difficult doing work/chores Not difficult at all   Interpretation of Total Score  Total Score Depression Severity:  1-4 = Minimal depression, 5-9 = Mild depression, 10-14 = Moderate depression, 15-19 = Moderately severe depression, 20-27 = Severe depression   Psychosocial Evaluation and Intervention:   Psychosocial Re-Evaluation:  Psychosocial Re-Evaluation     Row Name 09/11/23 0905 09/18/23 1337 10/15/23 1331 11/08/23 1518       Psychosocial Re-Evaluation   Current issues with Current Stress Concerns;Current Depression Current Stress Concerns;Current Depression Current Stress Concerns;Current Depression Current Stress Concerns;Current Depression    Comments Shaquile did not voice any increased concerns or stressors on his first day of exercise. Will discuss PHq2-9 in the upcoming week Rue says he is dissatisfied with his health due to his heart failure diagnosis. [place] did  not voice any increased concerns or stressors upon return to cardiac rehab on 10/15/23 Devrin did not voice any increased concerns or stressors during exercise at  cardiac rehab    Expected Outcomes Jaylene will have controlled or decreased depression/ stress upon completion of cardiac rehab Sly will have controlled or decreased depression/ stress upon completion of cardiac rehab Kailin will have controlled or decreased depression/ stress upon completion of cardiac rehab Reinhardt will have controlled or decreased depression/ stress upon completion of cardiac rehab    Interventions Stress management education;Encouraged to attend Cardiac Rehabilitation for the exercise;Relaxation education Stress management education;Encouraged to attend Cardiac Rehabilitation for the exercise;Relaxation education Stress management education;Encouraged to attend Cardiac Rehabilitation for the exercise;Relaxation education Stress management education;Encouraged to attend Cardiac Rehabilitation for the exercise;Relaxation education    Continue Psychosocial Services  No Follow up required Follow up required by staff Follow up required by staff No Follow up required      Initial Review   Source of Stress Concerns Chronic Illness;Unable to participate in former interests or hobbies;Unable to perform yard/household activities Chronic Illness;Unable to participate in former interests or hobbies;Unable to perform yard/household activities Chronic Illness;Unable to participate in former interests or hobbies;Unable to perform yard/household activities Chronic Illness;Unable to participate in former interests or hobbies;Unable to perform yard/household activities    Comments Will continue to monitor and offer support as needed. Will continue to monitor and offer support as needed. Will continue to monitor and offer support as needed. Will continue to monitor and offer support as needed.             Psychosocial Discharge (Final  Psychosocial Re-Evaluation):  Psychosocial  Re-Evaluation - 11/08/23 1518       Psychosocial Re-Evaluation   Current issues with Current Stress Concerns;Current Depression    Comments Sami did not voice any increased concerns or stressors during exercise at  cardiac rehab    Expected Outcomes Ellis will have controlled or decreased depression/ stress upon completion of cardiac rehab    Interventions Stress management education;Encouraged to attend Cardiac Rehabilitation for the exercise;Relaxation education    Continue Psychosocial Services  No Follow up required      Initial Review   Source of Stress Concerns Chronic Illness;Unable to participate in former interests or hobbies;Unable to perform yard/household activities    Comments Will continue to monitor and offer support as needed.             Vocational Rehabilitation: Provide vocational rehab assistance to qualifying candidates.   Vocational Rehab Evaluation & Intervention:  Vocational Rehab - 09/03/23 1120       Initial Vocational Rehab Evaluation & Intervention   Assessment shows need for Vocational Rehabilitation No   Kyrin is retired            Education: Education Goals: Education classes will be provided on a weekly basis, covering required topics. Participant will state understanding/return demonstration of topics presented.     Core Videos: Exercise    Move It!  Clinical staff conducted group or individual video education with verbal and written material and guidebook.  Patient learns the recommended Pritikin exercise program. Exercise with the goal of living a long, healthy life. Some of the health benefits of exercise include controlled diabetes, healthier blood pressure levels, improved cholesterol levels, improved heart and lung capacity, improved sleep, and better body composition. Everyone should speak with their doctor before starting or changing an exercise routine.  Biomechanical  Limitations Clinical staff conducted group or individual video education with verbal and written material and guidebook.  Patient learns how biomechanical limitations can impact exercise and how we can mitigate and possibly overcome limitations to have an impactful and balanced exercise routine.  Body Composition Clinical staff conducted group or individual video education with verbal and written material and guidebook.  Patient learns that body composition (ratio of muscle mass to fat mass) is a key component to assessing overall fitness, rather than body weight alone. Increased fat mass, especially visceral belly fat, can put [place] at increased risk for metabolic syndrome, type 2 diabetes, heart disease, and even death. It is recommended to combine diet and exercise (cardiovascular and resistance training) to improve your body composition. Seek guidance from your physician and exercise physiologist before implementing an exercise routine.  Exercise Action Plan Clinical staff conducted group or individual video education with verbal and written material and guidebook.  Patient learns the recommended strategies to achieve and enjoy long-term exercise adherence, including variety, self-motivation, self-efficacy, and positive decision making. Benefits of exercise include fitness, good health, weight management, more energy, better sleep, less stress, and overall well-being.  Medical   Heart Disease Risk Reduction Clinical staff conducted group or individual video education with verbal and written material and guidebook.  Patient learns our heart is our most vital organ as it circulates oxygen, nutrients, white blood cells, and hormones throughout the entire body, and carries waste away. Data supports a plant-based eating plan like the Pritikin Program for its effectiveness in slowing progression of and reversing heart disease. The video provides a number of recommendations to address heart  disease.   Metabolic Syndrome and Belly Fat  Clinical staff conducted  group or individual video education with verbal and written material and guidebook.  Patient learns what metabolic syndrome is, how it leads to heart disease, and how one can reverse it and keep it from coming back. You have metabolic syndrome if you have 3 of the following 5 criteria: abdominal obesity, high blood pressure, high triglycerides, low HDL cholesterol, and high blood sugar.  Hypertension and Heart Disease Clinical staff conducted group or individual video education with verbal and written material and guidebook.  Patient learns that high blood pressure, or hypertension, is very common in the [place]. Hypertension is largely due to excessive salt intake, but other important risk factors include being overweight, physical inactivity, drinking too much alcohol, smoking, and not eating enough potassium from fruits and vegetables. High blood pressure is a leading risk factor for heart attack, stroke, congestive heart failure, dementia, kidney failure, and premature death. Long-term effects of excessive salt intake include stiffening of the arteries and thickening of heart muscle and organ damage. Recommendations include ways to reduce hypertension and the risk of heart disease.  Diseases of Our Time - Focusing on Diabetes Clinical staff conducted group or individual video education with verbal and written material and guidebook.  Patient learns why the best way to stop diseases of our time is prevention, through food and other lifestyle changes. Medicine (such as prescription pills and surgeries) is often only a Band-Aid on the problem, not a long-term solution. Most common diseases of our time include obesity, type 2 diabetes, hypertension, heart disease, and cancer. The Pritikin Program is recommended and has been proven to help reduce, reverse, and/or prevent the damaging effects of metabolic syndrome.  Nutrition    Overview of the Pritikin Eating Plan  Clinical staff conducted group or individual video education with verbal and written material and guidebook.  Patient learns about the Pritikin Eating Plan for disease risk reduction. The Pritikin Eating Plan emphasizes a wide variety of unrefined, minimally-processed carbohydrates, like fruits, vegetables, whole grains, and legumes. Go, Caution, and Stop food choices are explained. Plant-based and lean animal proteins are emphasized. Rationale provided for low sodium intake for blood pressure control, low added sugars for blood sugar stabilization, and low added fats and oils for coronary artery disease risk reduction and weight management.  Calorie Density  Clinical staff conducted group or individual video education with verbal and written material and guidebook.  Patient learns about calorie density and how it impacts the Pritikin Eating Plan. Knowing the characteristics of the food you choose will help you decide whether those foods will lead to weight gain or weight loss, and whether you want to consume more or less of them. Weight loss is usually a side effect of the Pritikin Eating Plan because of its focus on low calorie-dense foods.  Label Reading  Clinical staff conducted group or individual video education with verbal and written material and guidebook.  Patient learns about the Pritikin recommended label reading guidelines and corresponding recommendations regarding calorie density, added sugars, sodium content, and whole grains.  Dining Out - Part 1  Clinical staff conducted group or individual video education with verbal and written material and guidebook.  Patient learns that restaurant meals can be sabotaging because they can be so high in calories, fat, sodium, and/or sugar. Patient learns recommended strategies on how to positively address this and avoid unhealthy pitfalls.  Facts on Fats  Clinical staff conducted group or individual video  education with verbal and written material and guidebook.  Patient learns  that lifestyle modifications can be just as effective, if not more so, as many medications for lowering your risk of heart disease. A Pritikin lifestyle can help to reduce your risk of inflammation and atherosclerosis (cholesterol build-up, or plaque, in the artery walls). Lifestyle interventions such as dietary choices and physical activity address the cause of atherosclerosis. A review of the types of fats and their impact on blood cholesterol levels, along with dietary recommendations to reduce fat intake is also included.  Nutrition Action Plan  Clinical staff conducted group or individual video education with verbal and written material and guidebook.  Patient learns how to incorporate Pritikin recommendations into their lifestyle. Recommendations include planning and keeping personal health goals in mind as an important part of their success.  Healthy Mind-Set    Healthy Minds, Bodies, Hearts  Clinical staff conducted group or individual video education with verbal and written material and guidebook.  Patient learns how to identify when they are stressed. Video will discuss the impact of that stress, as well as the many benefits of stress management. Patient will also be introduced to stress management techniques. The way we think, act, and feel has an impact on our hearts.  How Our Thoughts Can Heal Our Hearts  Clinical staff conducted group or individual video education with verbal and written material and guidebook.  Patient learns that negative thoughts can cause depression and anxiety. This can result in negative lifestyle behavior and serious health problems. Cognitive behavioral therapy is an effective method to help control our thoughts in order to change and improve our emotional outlook.  Additional Videos:  Exercise    Improving Performance  Clinical staff conducted group or individual video education with  verbal and written material and guidebook.  Patient learns to use a non-linear approach by alternating intensity levels and lengths of time spent exercising to help burn more calories and lose more body fat. Cardiovascular exercise helps improve heart health, metabolism, hormonal balance, blood sugar control, and recovery from fatigue. Resistance training improves strength, endurance, balance, coordination, reaction time, metabolism, and muscle mass. Flexibility exercise improves circulation, posture, and balance. Seek guidance from your physician and exercise physiologist before implementing an exercise routine and learn your capabilities and proper form for all exercise.  Introduction to Yoga  Clinical staff conducted group or individual video education with verbal and written material and guidebook.  Patient learns about yoga, a discipline of the coming together of mind, breath, and body. The benefits of yoga include improved flexibility, improved range of motion, better posture and core strength, increased lung function, weight loss, and positive self-image. Yoga's heart health benefits include lowered blood pressure, healthier heart rate, decreased cholesterol and triglyceride levels, improved immune function, and reduced stress. Seek guidance from your physician and exercise physiologist before implementing an exercise routine and learn your capabilities and proper form for all exercise.  Medical   Aging: Enhancing Your Quality of Life  Clinical staff conducted group or individual video education with verbal and written material and guidebook.  Patient learns key strategies and recommendations to stay in good physical health and enhance quality of life, such as prevention strategies, having an advocate, securing a Health Care Proxy and Power of Attorney, and keeping a list of medications and system for tracking them. It also discusses how to avoid risk for bone loss.  Biology of Weight Control   Clinical staff conducted group or individual video education with verbal and written material and guidebook.  Patient learns that weight gain  occurs because we consume more calories than we burn (eating more, moving less). Even if your body weight is normal, you may have higher ratios of fat compared to muscle mass. Too much body fat puts you at increased risk for cardiovascular disease, heart attack, stroke, type 2 diabetes, and obesity-related cancers. In addition to exercise, following the Pritikin Eating Plan can help reduce your risk.  Decoding Lab Results  Clinical staff conducted group or individual video education with verbal and written material and guidebook.  Patient learns that lab test reflects one measurement whose values change over time and are influenced by many factors, including medication, stress, sleep, exercise, food, hydration, pre-existing medical conditions, and more. It is recommended to use the knowledge from this video to become more involved with your lab results and evaluate your numbers to speak with your doctor.   Diseases of Our Time - Overview  Clinical staff conducted group or individual video education with verbal and written material and guidebook.  Patient learns that according to the CDC, 50% to 70% of chronic diseases (such as obesity, type 2 diabetes, elevated lipids, hypertension, and heart disease) are avoidable through lifestyle improvements including healthier food choices, listening to satiety cues, and increased physical activity.  Sleep Disorders Clinical staff conducted group or individual video education with verbal and written material and guidebook.  Patient learns how good quality and duration of sleep are important to overall health and well-being. Patient also learns about sleep disorders and how they impact health along with recommendations to address them, including discussing with a physician.  Nutrition  Dining Out - Part 2 Clinical staff  conducted group or individual video education with verbal and written material and guidebook.  Patient learns how to plan ahead and communicate in order to maximize their dining experience in a healthy and nutritious manner. Included are recommended food choices based on the type of restaurant the patient is visiting.   Fueling a Banker conducted group or individual video education with verbal and written material and guidebook.  There is a strong connection between our food choices and our health. Diseases like obesity and type 2 diabetes are very prevalent and are in large-part due to lifestyle choices. The Pritikin Eating Plan provides plenty of food and hunger-curbing satisfaction. It is easy to follow, affordable, and helps reduce health risks.  Menu Workshop  Clinical staff conducted group or individual video education with verbal and written material and guidebook.  Patient learns that restaurant meals can sabotage health goals because they are often packed with calories, fat, sodium, and sugar. Recommendations include strategies to plan ahead and to communicate with the manager, chef, or server to help order a healthier meal.  Planning Your Eating Strategy  Clinical staff conducted group or individual video education with verbal and written material and guidebook.  Patient learns about the Pritikin Eating Plan and its benefit of reducing the risk of disease. The Pritikin Eating Plan does not focus on calories. Instead, it emphasizes high-quality, nutrient-rich foods. By knowing the characteristics of the foods, we choose, we can determine their calorie density and make informed decisions.  Targeting Your Nutrition Priorities  Clinical staff conducted group or individual video education with verbal and written material and guidebook.  Patient learns that lifestyle habits have a tremendous impact on disease risk and progression. This video provides eating and physical  activity recommendations based on your personal health goals, such as reducing LDL cholesterol, losing weight, preventing or controlling type  2 diabetes, and reducing high blood pressure.  Vitamins and Minerals  Clinical staff conducted group or individual video education with verbal and written material and guidebook.  Patient learns different ways to obtain key vitamins and minerals, including through a recommended healthy diet. It is important to discuss all supplements you take with your doctor.   Healthy Mind-Set    Smoking Cessation  Clinical staff conducted group or individual video education with verbal and written material and guidebook.  Patient learns that cigarette smoking and tobacco addiction pose a serious health risk which affects millions of people. Stopping smoking will significantly reduce the risk of heart disease, lung disease, and many forms of cancer. Recommended strategies for quitting are covered, including working with your doctor to develop a successful plan.  Culinary   Becoming a Set designer conducted group or individual video education with verbal and written material and guidebook.  Patient learns that cooking at home can be healthy, cost-effective, quick, and puts them in control. Keys to cooking healthy recipes will include looking at your recipe, assessing your equipment needs, planning ahead, making it simple, choosing cost-effective seasonal ingredients, and limiting the use of added fats, salts, and sugars.  Cooking - Breakfast and Snacks  Clinical staff conducted group or individual video education with verbal and written material and guidebook.  Patient learns how important breakfast is to satiety and nutrition through the entire day. Recommendations include key foods to eat during breakfast to help stabilize blood sugar levels and to prevent overeating at meals later in the day. Planning ahead is also a key component.  Cooking - Psychologist, educational conducted group or individual video education with verbal and written material and guidebook.  Patient learns eating strategies to improve overall health, including an approach to cook more at home. Recommendations include thinking of animal protein as a side on your plate rather than center stage and focusing instead on lower calorie dense options like vegetables, fruits, whole grains, and plant-based proteins, such as beans. Making sauces in large quantities to freeze for later and leaving the skin on your vegetables are also recommended to maximize your experience.  Cooking - Healthy Salads and Dressing Clinical staff conducted group or individual video education with verbal and written material and guidebook.  Patient learns that vegetables, fruits, whole grains, and legumes are the foundations of the Pritikin Eating Plan. Recommendations include how to incorporate each of these in flavorful and healthy salads, and how to create homemade salad dressings. Proper handling of ingredients is also covered. Cooking - Soups and [employer] - Soups and Desserts Clinical staff conducted group or individual video education with verbal and written material and guidebook.  Patient learns that Pritikin soups and desserts make for easy, nutritious, and delicious snacks and meal components that are low in sodium, fat, sugar, and calorie density, while high in vitamins, minerals, and filling fiber. Recommendations include simple and healthy ideas for soups and desserts.   Overview     The Pritikin Solution Program Overview Clinical staff conducted group or individual video education with verbal and written material and guidebook.  Patient learns that the results of the Pritikin Program have been documented in more than 100 articles published in peer-reviewed journals, and the benefits include reducing risk factors for (and, in some cases, even reversing) high cholesterol, high  blood pressure, type 2 diabetes, obesity, and more! An overview of the three key pillars of the Pritikin Program will  be covered: eating well, doing regular exercise, and having a healthy mind-set.  WORKSHOPS  Exercise: Exercise Basics: Building Your Action Plan Clinical staff led group instruction and group discussion with PowerPoint presentation and patient guidebook. To enhance the learning environment the use of posters, models and videos may be added. At the conclusion of this workshop, patients will comprehend the difference between physical activity and exercise, as well as the benefits of incorporating both, into their routine. Patients will understand the FITT (Frequency, Intensity, Time, and Type) principle and how to use it to build an exercise action plan. In addition, safety concerns and other considerations for exercise and cardiac rehab will be addressed by the presenter. The purpose of this lesson is to promote a comprehensive and effective weekly exercise routine in order to improve patients' overall level of fitness.   Managing Heart Disease: Your Path to a Healthier Heart Clinical staff led group instruction and group discussion with PowerPoint presentation and patient guidebook. To enhance the learning environment the use of posters, models and videos may be added.At the conclusion of this workshop, patients will understand the anatomy and physiology of the heart. Additionally, they will understand how Pritikin's three pillars impact the risk factors, the progression, and the management of heart disease.  The purpose of this lesson is to provide a high-level overview of the heart, heart disease, and how the Pritikin lifestyle positively impacts risk factors.  Exercise Biomechanics Clinical staff led group instruction and group discussion with PowerPoint presentation and patient guidebook. To enhance the learning environment the use of posters, models and videos may be added.  Patients will learn how the structural parts of their bodies function and how these functions impact their daily activities, movement, and exercise. Patients will learn how to promote a neutral spine, learn how to manage pain, and identify ways to improve their physical movement in order to promote healthy living. The purpose of this lesson is to expose patients to common physical limitations that impact physical activity. Participants will learn practical ways to adapt and manage aches and pains, and to minimize their effect on regular exercise. Patients will learn how to maintain good posture while sitting, walking, and lifting.  Balance Training and Fall Prevention  Clinical staff led group instruction and group discussion with PowerPoint presentation and patient guidebook. To enhance the learning environment the use of posters, models and videos may be added. At the conclusion of this workshop, patients will understand the importance of their sensorimotor skills (vision, proprioception, and the vestibular system) in maintaining their ability to balance as they age. Patients will apply a variety of balancing exercises that are appropriate for their current level of function. Patients will understand the common causes for poor balance, possible solutions to these problems, and ways to modify their physical environment in order to minimize their fall risk. The purpose of this lesson is to teach patients about the importance of maintaining balance as they age and ways to minimize their risk of falling.  WORKSHOPS   Nutrition:  Fueling a Ship broker led group instruction and group discussion with PowerPoint presentation and patient guidebook. To enhance the learning environment the use of posters, models and videos may be added. Patients will review the foundational principles of the Pritikin Eating Plan and understand what constitutes a serving size in each of the food groups.  Patients will also learn Pritikin-friendly foods that are better choices when away from home and review make-ahead meal and snack options. Calorie density  will be reviewed and applied to three nutrition priorities: weight maintenance, weight loss, and weight gain. The purpose of this lesson is to reinforce (in a group setting) the key concepts around what patients are recommended to eat and how to apply these guidelines when away from home by planning and selecting Pritikin-friendly options. Patients will understand how calorie density may be adjusted for different weight management goals.  Mindful Eating  Clinical staff led group instruction and group discussion with PowerPoint presentation and patient guidebook. To enhance the learning environment the use of posters, models and videos may be added. Patients will briefly review the concepts of the Pritikin Eating Plan and the importance of low-calorie dense foods. The concept of mindful eating will be introduced as well as the importance of paying attention to internal hunger signals. Triggers for non-hunger eating and techniques for dealing with triggers will be explored. The purpose of this lesson is to provide patients with the opportunity to review the basic principles of the Pritikin Eating Plan, discuss the value of eating mindfully and how to measure internal cues of hunger and fullness using the Hunger Scale. Patients will also discuss reasons for non-hunger eating and learn strategies to use for controlling emotional eating.  Targeting Your Nutrition Priorities Clinical staff led group instruction and group discussion with PowerPoint presentation and patient guidebook. To enhance the learning environment the use of posters, models and videos may be added. Patients will learn how to determine their genetic susceptibility to disease by reviewing their family history. Patients will gain insight into the importance of diet as part of an overall healthy  lifestyle in mitigating the impact of genetics and other environmental insults. The purpose of this lesson is to provide patients with the opportunity to assess their personal nutrition priorities by looking at their family history, their own health history and current risk factors. Patients will also be able to discuss ways of prioritizing and modifying the Pritikin Eating Plan for their highest risk areas  Menu  Clinical staff led group instruction and group discussion with PowerPoint presentation and patient guidebook. To enhance the learning environment the use of posters, models and videos may be added. Using menus brought in from [employer], or printed from [employer], patients will apply the Pritikin dining out guidelines that were presented in the [employer] video. Patients will also be able to practice these guidelines in a variety of provided scenarios. The purpose of this lesson is to provide patients with the opportunity to practice hands-on learning of the Pritikin Dining Out guidelines with actual menus and practice scenarios.  Label Reading Clinical staff led group instruction and group discussion with PowerPoint presentation and patient guidebook. To enhance the learning environment the use of posters, models and videos may be added. Patients will review and discuss the Pritikin label reading guidelines presented in [employer]'s Label Reading Educational series video. Using fool labels brought in from local grocery stores and markets, patients will apply the label reading guidelines and determine if the packaged food meet the Pritikin guidelines. The purpose of this lesson is to provide patients with the opportunity to review, discuss, and practice hands-on learning of the Pritikin Label Reading guidelines with actual packaged food labels. Cooking School  Pritikin's [employer] are designed to teach patients ways to prepare quick, simple, and  affordable recipes at home. The importance of nutrition's role in chronic disease risk reduction is reflected in its emphasis in the overall Pritikin program. By learning how  to prepare essential core Pritikin Eating Plan recipes, patients will increase control over what they eat; be able to customize the flavor of foods without the use of added salt, sugar, or fat; and improve the quality of the food they consume. By learning a set of core recipes which are easily assembled, quickly prepared, and affordable, patients are more likely to prepare more healthy foods at home. These workshops focus on convenient breakfasts, simple entres, side dishes, and desserts which can be prepared with minimal effort and are consistent with nutrition recommendations for cardiovascular risk reduction. Cooking [employer] are taught by a Armed forces logistics/support/administrative officer (RD) who has been trained by the [employer]. The chef or RD has a clear understanding of the importance of minimizing - if not completely eliminating - added fat, sugar, and sodium in recipes. Throughout the series of Cooking School Workshop sessions, patients will learn about healthy ingredients and efficient methods of cooking to build confidence in their capability to prepare    Cooking School weekly topics:  Adding Flavor- Sodium-Free  Fast and Healthy Breakfasts  Powerhouse Plant-Based Proteins  Satisfying Salads and Dressings  Simple Sides and Sauces  International Cuisine-Spotlight on the [employer] Zones  Delicious Desserts  Savory Soups  [employer] - Meals in a Astronomer Appetizers and Snacks  Comforting Weekend Breakfasts  One-Pot Wonders   Fast Evening Meals  Landscape architect Your Pritikin Plate  WORKSHOPS   Healthy Mindset (Psychosocial):  Focused Goals, Sustainable Changes Clinical staff led group instruction and group discussion with PowerPoint presentation and patient guidebook. To enhance  the learning environment the use of posters, models and videos may be added. Patients will be able to apply effective goal setting strategies to establish at least one personal goal, and then take consistent, meaningful action toward that goal. They will learn to identify common barriers to achieving personal goals and develop strategies to overcome them. Patients will also gain an understanding of how our mind-set can impact our ability to achieve goals and the importance of cultivating a positive and growth-oriented mind-set. The purpose of this lesson is to provide patients with a deeper understanding of how to set and achieve personal goals, as well as the tools and strategies needed to overcome common obstacles which may arise along the way.  From Head to Heart: The Power of a Healthy Outlook  Clinical staff led group instruction and group discussion with PowerPoint presentation and patient guidebook. To enhance the learning environment the use of posters, models and videos may be added. Patients will be able to recognize and describe the impact of emotions and mood on physical health. They will discover the importance of self-care and explore self-care practices which may work for them. Patients will also learn how to utilize the 4 C's to cultivate a healthier outlook and better manage stress and challenges. The purpose of this lesson is to demonstrate to patients how a healthy outlook is an essential part of maintaining good health, especially as they continue their cardiac rehab journey.  Healthy Sleep for a Healthy Heart Clinical staff led group instruction and group discussion with PowerPoint presentation and patient guidebook. To enhance the learning environment the use of posters, models and videos may be added. At the conclusion of this workshop, patients will be able to demonstrate knowledge of the importance of sleep to overall health, well-being, and quality of life. They will understand the  symptoms of, and treatments for, common sleep disorders. Patients  will also be able to identify daytime and nighttime behaviors which impact sleep, and they will be able to apply these tools to help manage sleep-related challenges. The purpose of this lesson is to provide patients with a general overview of sleep and outline the importance of quality sleep. Patients will learn about a few of the most common sleep disorders. Patients will also be introduced to the concept of "sleep hygiene," and discover ways to self-manage certain sleeping problems through simple daily behavior changes. Finally, the workshop will motivate patients by clarifying the links between quality sleep and their goals of heart-healthy living.   Recognizing and Reducing Stress Clinical staff led group instruction and group discussion with PowerPoint presentation and patient guidebook. To enhance the learning environment the use of posters, models and videos may be added. At the conclusion of this workshop, patients will be able to understand the types of stress reactions, differentiate between acute and chronic stress, and recognize the impact that chronic stress has on their health. They will also be able to apply different coping mechanisms, such as reframing negative self-talk. Patients will have the opportunity to practice a variety of stress management techniques, such as deep abdominal breathing, progressive muscle relaxation, and/or guided imagery.  The purpose of this lesson is to educate patients on the role of stress in their lives and to provide healthy techniques for coping with it.  Learning Barriers/Preferences:  Learning Barriers/Preferences - 09/03/23 1119       Learning Barriers/Preferences   Learning Barriers Sight   wears glasses   Learning Preferences Audio;Group Instruction;Individual Instruction;Pictoral;Skilled Demonstration;Verbal Instruction;Written Material;Computer/Internet;Video              Education Topics:  Knowledge Questionnaire Score:  Knowledge Questionnaire Score - 09/03/23 1120       Knowledge Questionnaire Score   Pre Score 21/24             Core Components/Risk Factors/Patient Goals at Admission:  Personal Goals and Risk Factors at Admission - 09/03/23 1120       Core Components/Risk Factors/Patient Goals on Admission    Weight Management Yes;Obesity;Weight Loss    Intervention Weight Management: Develop a combined nutrition and exercise program designed to reach desired caloric intake, while maintaining appropriate intake of nutrient and fiber, sodium and fats, and appropriate energy expenditure required for the weight goal.;Weight Management/Obesity: Establish reasonable short term and long term weight goals.;Weight Management: Provide education and appropriate resources to help participant work on and attain dietary goals.;Obesity: Provide education and appropriate resources to help participant work on and attain dietary goals.    Expected Outcomes Short Term: Continue to assess and modify interventions until short term weight is achieved;Long Term: Adherence to nutrition and physical activity/exercise program aimed toward attainment of established weight goal;Understanding recommendations for meals to include 15-35% energy as protein, 25-35% energy from fat, 35-60% energy from carbohydrates, less than 200mg  of dietary cholesterol, 20-35 gm of total fiber daily;Weight Loss: Understanding of general recommendations for a balanced deficit meal plan, which promotes 1-2 lb weight loss per week and includes a negative energy balance of [id number] kcal/d;Understanding of distribution of calorie intake throughout the day with the consumption of 4-5 meals/snacks    Diabetes Yes    Intervention Provide education about signs/symptoms and action to take for hypo/hyperglycemia.;Provide education about proper nutrition, including hydration, and aerobic/resistive exercise  prescription along with prescribed medications to achieve blood glucose in normal ranges: Fasting glucose 65-99 mg/dL    Expected Outcomes Short Term: Participant verbalizes  understanding of the signs/symptoms and immediate care of hyper/hypoglycemia, proper foot care and importance of medication, aerobic/resistive exercise and nutrition plan for blood glucose control.;Long Term: Attainment of HbA1C < 7%.    Heart Failure Yes    Intervention Provide a combined exercise and nutrition program that is supplemented with education, support and counseling about heart failure. Directed toward relieving symptoms such as shortness of breath, decreased exercise tolerance, and extremity edema.    Expected Outcomes Long term: Adoption of self-care skills and reduction of barriers for early signs and symptoms recognition and intervention leading to self-care maintenance.;Short term: Daily weights obtained and reported for increase. Utilizing diuretic protocols set by physician.;Short term: Attendance in program 2-3 days a week with increased exercise capacity. Reported lower sodium intake. Reported increased fruit and vegetable intake. Reports medication compliance.;Improve functional capacity of life    Hypertension Yes    Intervention Provide education on lifestyle modifcations including regular physical activity/exercise, weight management, moderate sodium restriction and increased consumption of fresh fruit, vegetables, and low fat dairy, alcohol moderation, and smoking cessation.;Monitor prescription use compliance.    Expected Outcomes Short Term: Continued assessment and intervention until BP is < 140/69mm HG in hypertensive participants. < 130/58mm HG in hypertensive participants with diabetes, heart failure or chronic kidney disease.;Long Term: Maintenance of blood pressure at goal levels.    Stress Yes    Intervention Offer individual and/or small group education and counseling on adjustment to heart disease,  stress management and health-related lifestyle change. Teach and support self-help strategies.;Refer participants experiencing significant psychosocial distress to appropriate mental health specialists for further evaluation and treatment. When possible, include family members and significant others in education/counseling sessions.    Expected Outcomes Short Term: Participant demonstrates changes in health-related behavior, relaxation and other stress management skills, ability to obtain effective social support, and compliance with psychotropic medications if prescribed.;Long Term: Emotional wellbeing is indicated by absence of clinically significant psychosocial distress or social isolation.             Core Components/Risk Factors/Patient Goals Review:   Goals and Risk Factor Review     Row Name 09/11/23 0910 09/18/23 1339 10/15/23 1336 11/08/23 1522       Core Components/Risk Factors/Patient Goals Review   Personal Goals Review Weight Management/Obesity;Lipids;Stress;Heart Failure;Diabetes;Hypertension Weight Management/Obesity;Lipids;Stress;Heart Failure;Diabetes;Hypertension Weight Management/Obesity;Lipids;Stress;Heart Failure;Diabetes;Hypertension Weight Management/Obesity;Lipids;Stress;Heart Failure;Diabetes;Hypertension    Review Candler did fair with exercise for his fitness level. vital signs and CBG's were stable. Stillman did exceed his target heart rate when walking the track and was switched to the nustep. Oseias is deconditioned. Anastasios has a follow an appointment with Dr Elwyn Lade on 09/11/23. Braxxton did fair with exercise for his fitness level. vital signs have bbenstable.  patient is on Midodrine. Trayden did receive a  target heart rate increase per Dr Elwyn Lade. Jafeth remains deconditioned.  Will continue to monitor. Nayan returned to exercise at cardiac rehab per his PCP. vital signs and CBG's were stable today. Denton is doing well with exercise at cardiac rehab  vital signs and CBG's have  been stable. Jeet says he feels stronger since he has been participating in cardiac rehab. Kael has lost 1.4 kg since starting  cardiac rehab.    Expected Outcomes Reymond will continue to participate in cardiac rehab for exercise, nutrition and lifestyle modifications. Bodi will continue to participate in cardiac rehab for exercise, nutrition and lifestyle modifications. Katrell will continue to participate in cardiac rehab for exercise, nutrition and lifestyle modifications. Sanjit will continue to participate in  cardiac rehab for exercise, nutrition and lifestyle modifications.             Core Components/Risk Factors/Patient Goals at Discharge (Final Review):   Goals and Risk Factor Review - 11/08/23 1522       Core Components/Risk Factors/Patient Goals Review   Personal Goals Review Weight Management/Obesity;Lipids;Stress;Heart Failure;Diabetes;Hypertension    Review Wilferd is doing well with exercise at cardiac rehab  vital signs and CBG's have been stable. Jamaar says he feels stronger since he has been participating in cardiac rehab. Delma has lost 1.4 kg since starting  cardiac rehab.    Expected Outcomes Ulys will continue to participate in cardiac rehab for exercise, nutrition and lifestyle modifications.             ITP Comments:  ITP Comments     Row Name 09/03/23 1115 09/11/23 0902 09/18/23 1330 10/15/23 1330 11/08/23 1518   ITP Comments Dr. Armanda Magic medical director. Introduction to pritikin education/intensive cardiac rehab. Initial orientation packet reviewed with patient. 30 Day ITP Review. Krista started cardiac rehab on 09/10/23. Robb did well with exercise for his fitness level as Sterlin is somewhat decondtioned. 30 Day ITP Review. Dryden is off to a good start to  exercise for his fitness level 30 Day ITP Review. Graesyn returned to exercise at cardiac rehab after being absent due to a fall. Kyan has been cleared to return to exercise per his PCP 30 Day ITP Review.  Clarke has good attendnace and participation with exercise at cardiac rehab            Comments: See ITP Comments

## 2023-11-14 ENCOUNTER — Encounter (HOSPITAL_COMMUNITY)
Admission: RE | Admit: 2023-11-14 | Discharge: 2023-11-14 | Disposition: A | Discharge status: Home / Self Care | Payer: Medicare Other | Source: Ambulatory Visit | Attending: Cardiovascular Disease | Admitting: Cardiovascular Disease

## 2023-11-14 DIAGNOSIS — Z5189 Encounter for other specified aftercare: Secondary | ICD-10-CM | POA: Diagnosis not present

## 2023-11-14 DIAGNOSIS — I5022 Chronic systolic (congestive) heart failure: Secondary | ICD-10-CM

## 2023-11-14 LAB — GLUCOSE, CAPILLARY: Glucose-Capillary: 92 mg/dL (ref 70–99)

## 2023-11-16 ENCOUNTER — Encounter (HOSPITAL_COMMUNITY): Payer: Medicare Other

## 2023-11-19 ENCOUNTER — Ambulatory Visit (INDEPENDENT_AMBULATORY_CARE_PROVIDER_SITE_OTHER): Payer: Medicare Other

## 2023-11-19 ENCOUNTER — Encounter (HOSPITAL_COMMUNITY)
Admission: RE | Admit: 2023-11-19 | Discharge: 2023-11-19 | Disposition: A | Discharge status: Home / Self Care | Payer: Medicare Other | Source: Ambulatory Visit | Attending: Cardiovascular Disease | Admitting: Cardiovascular Disease

## 2023-11-19 DIAGNOSIS — Z5189 Encounter for other specified aftercare: Secondary | ICD-10-CM | POA: Diagnosis not present

## 2023-11-19 DIAGNOSIS — I5022 Chronic systolic (congestive) heart failure: Secondary | ICD-10-CM

## 2023-11-19 DIAGNOSIS — I428 Other cardiomyopathies: Secondary | ICD-10-CM

## 2023-11-21 ENCOUNTER — Encounter (HOSPITAL_COMMUNITY)
Admission: RE | Admit: 2023-11-21 | Discharge: 2023-11-21 | Disposition: A | Discharge status: Home / Self Care | Payer: Medicare Other | Source: Ambulatory Visit | Attending: Cardiovascular Disease | Admitting: Cardiovascular Disease

## 2023-11-21 DIAGNOSIS — I5022 Chronic systolic (congestive) heart failure: Secondary | ICD-10-CM

## 2023-11-21 DIAGNOSIS — Z5189 Encounter for other specified aftercare: Secondary | ICD-10-CM | POA: Diagnosis not present

## 2023-11-22 ENCOUNTER — Telehealth (HOSPITAL_COMMUNITY): Payer: Self-pay | Admitting: Cardiology

## 2023-11-22 ENCOUNTER — Encounter: Payer: Self-pay | Admitting: Hematology & Oncology

## 2023-11-22 ENCOUNTER — Other Ambulatory Visit: Payer: Self-pay

## 2023-11-22 ENCOUNTER — Inpatient Hospital Stay: Payer: Medicare Other | Attending: Hematology & Oncology

## 2023-11-22 ENCOUNTER — Inpatient Hospital Stay (HOSPITAL_BASED_OUTPATIENT_CLINIC_OR_DEPARTMENT_OTHER): Payer: Medicare Other | Admitting: Hematology & Oncology

## 2023-11-22 VITALS — BP 101/55 | HR 80 | Temp 98.6°F | Resp 18 | Ht 71.0 in | Wt 233.0 lb

## 2023-11-22 DIAGNOSIS — I509 Heart failure, unspecified: Secondary | ICD-10-CM | POA: Insufficient documentation

## 2023-11-22 DIAGNOSIS — I82403 Acute embolism and thrombosis of unspecified deep veins of lower extremity, bilateral: Secondary | ICD-10-CM | POA: Diagnosis present

## 2023-11-22 DIAGNOSIS — Z885 Allergy status to narcotic agent status: Secondary | ICD-10-CM | POA: Diagnosis not present

## 2023-11-22 DIAGNOSIS — Z7901 Long term (current) use of anticoagulants: Secondary | ICD-10-CM | POA: Diagnosis not present

## 2023-11-22 DIAGNOSIS — I2782 Chronic pulmonary embolism: Secondary | ICD-10-CM

## 2023-11-22 DIAGNOSIS — Z79899 Other long term (current) drug therapy: Secondary | ICD-10-CM | POA: Insufficient documentation

## 2023-11-22 DIAGNOSIS — I2699 Other pulmonary embolism without acute cor pulmonale: Secondary | ICD-10-CM | POA: Diagnosis present

## 2023-11-22 DIAGNOSIS — I428 Other cardiomyopathies: Secondary | ICD-10-CM

## 2023-11-22 DIAGNOSIS — I2609 Other pulmonary embolism with acute cor pulmonale: Secondary | ICD-10-CM

## 2023-11-22 LAB — CMP (CANCER CENTER ONLY)
ALT: 19 U/L (ref 0–44)
AST: 18 U/L (ref 15–41)
Albumin: 3.7 g/dL (ref 3.5–5.0)
Alkaline Phosphatase: 83 U/L (ref 38–126)
Anion gap: 8 (ref 5–15)
BUN: 23 mg/dL (ref 8–23)
CO2: 26 mmol/L (ref 22–32)
Calcium: 8.9 mg/dL (ref 8.9–10.3)
Chloride: 109 mmol/L (ref 98–111)
Creatinine: 1.45 mg/dL — ABNORMAL HIGH (ref 0.61–1.24)
GFR, Estimated: 50 mL/min — ABNORMAL LOW (ref >60)
Glucose, Bld: 165 mg/dL — ABNORMAL HIGH (ref 70–99)
Potassium: 4.7 mmol/L (ref 3.5–5.1)
Sodium: 143 mmol/L (ref 135–145)
Total Bilirubin: 0.5 mg/dL (ref 0.0–1.2)
Total Protein: 6 g/dL — ABNORMAL LOW (ref 6.5–8.1)

## 2023-11-22 LAB — CBC WITH DIFFERENTIAL (CANCER CENTER ONLY)
Abs Immature Granulocytes: 0.04 10*3/uL (ref 0.00–0.07)
Basophils Absolute: 0 10*3/uL (ref 0.0–0.1)
Basophils Relative: 0 %
Eosinophils Absolute: 0.1 10*3/uL (ref 0.0–0.5)
Eosinophils Relative: 1 %
HCT: 42.5 % (ref 39.0–52.0)
Hemoglobin: 13.5 g/dL (ref 13.0–17.0)
Immature Granulocytes: 0 %
Lymphocytes Relative: 26 %
Lymphs Abs: 2.5 10*3/uL (ref 0.7–4.0)
MCH: 30.1 pg (ref 26.0–34.0)
MCHC: 31.8 g/dL (ref 30.0–36.0)
MCV: 94.9 fL (ref 80.0–100.0)
Monocytes Absolute: 0.6 10*3/uL (ref 0.1–1.0)
Monocytes Relative: 6 %
Neutro Abs: 6.3 10*3/uL (ref 1.7–7.7)
Neutrophils Relative %: 67 %
Platelet Count: 221 10*3/uL (ref 150–400)
RBC: 4.48 MIL/uL (ref 4.22–5.81)
RDW: 14.6 % (ref 11.5–15.5)
WBC Count: 9.6 10*3/uL (ref 4.0–10.5)
nRBC: 0 % (ref 0.0–0.2)

## 2023-11-22 NOTE — Progress Notes (Signed)
Hematology and Oncology Follow Up Visit  Steven Beard [id number] 1946/01/10 78 y.o. 11/22/2023   Principle Diagnosis:  Bilateral pulmonary emboli Bilateral lower extremity thromboembolic disease Congestive heart failure  Current Therapy:   Eliquis 5 mg p.o. twice daily     Interim History:  Mr. Steven Beard is back for follow-up.  This is his second office visit.  We first saw him on 10/11/2023.  At that time, he had blood clots.  He got these when he was up in [place].  We repeated his CT angiogram.  This was done on 10/25/2023.  This did not show any evidence of residual pulmonary emboli.  He also had Dopplers of his legs.  This was also done on 10/25/2023.  This showed no evidence of residual thromboembolic disease.  He is going need to be on lifelong anticoagulation in my opinion given his congestive heart failure.  He has had no problems with cough.  There is been no chest wall pain.  He says he feels somewhat good right now.  Some of this has to do with medicine adjustment.  He has had no bleeding.  Has been no problems with bowels or bladder.  Overall, I would say that his performance status is ECOG 1.  Medications:  Current Outpatient Medications:    ACCU-CHEK GUIDE test strip, 1 (ONE) STRIP IN VITRO TWO TIMES DAILY (Patient not taking: Reported on 10/11/2023), Disp: , Rfl:    Accu-Chek Softclix Lancets lancets, SMARTSIG:Topical (Patient not taking: Reported on 10/11/2023), Disp: , Rfl:    acetaminophen (TYLENOL) 500 MG tablet, Take 1,000 mg by mouth every 6 (six) hours as needed for moderate pain or headache., Disp: , Rfl:    ALPRAZolam (XANAX) 1 MG tablet, Take 1 mg by mouth at bedtime., Disp: , Rfl:    apixaban (ELIQUIS) 2.5 MG TABS tablet, Take 2.5 mg by mouth 2 (two) times daily., Disp: , Rfl:    Ascorbic Acid (VITAMIN C PO), Take by mouth daily., Disp: , Rfl:    Blood Glucose Monitoring Suppl (ACCU-CHEK GUIDE ME) w/Device KIT, 1 (ONE) KIT AS DIRECETD (Patient not taking:  Reported on 10/11/2023), Disp: , Rfl:    buPROPion (WELLBUTRIN XL) 150 MG 24 hr tablet, Take 150 mg by mouth daily., Disp: , Rfl:    buPROPion (WELLBUTRIN XL) 300 MG 24 hr tablet, Take 300 mg by mouth daily. Patient says dose was increased to 450 mg once a day, Disp: , Rfl:    Calcium Citrate-Vitamin D (CALCIUM CITRATE + PO), Take by mouth daily., Disp: , Rfl:    carvedilol (COREG) 3.125 MG tablet, TAKE 1 TABLET BY MOUTH TWICE  DAILY WITH MEALS, Disp: 180 tablet, Rfl: 3   Cholecalciferol (VITAMIN D3) 50 MCG (2000 UT) capsule, Take 1 capsule by mouth every morning., Disp: , Rfl:    cyanocobalamin (,VITAMIN B-12,) 1000 MCG/ML injection, Inject 1,000 mcg into the muscle every 30 (thirty) days., Disp: , Rfl:    finasteride (PROSCAR) 5 MG tablet, Take 5 mg by mouth at bedtime., Disp: , Rfl:    fluorouracil (EFUDEX) 5 % cream, Apply topically 2 (two) times daily., Disp: , Rfl:    HYDROcodone-acetaminophen (NORCO/VICODIN) 5-325 MG tablet, Take 1 tablet by mouth every 4 (four) hours as needed. (Patient not taking: Reported on 10/11/2023), Disp: 10 tablet, Rfl: 0   JARDIANCE 10 MG TABS tablet, Take 10 mg by mouth daily., Disp: , Rfl:    levothyroxine (SYNTHROID, LEVOTHROID) 175 MCG tablet, Take 175 mcg by mouth See admin instructions. Take  Monday - Saturday, Disp: , Rfl:    midodrine (PROAMATINE) 2.5 MG tablet, Take 2 tablets (5 mg total) by mouth 3 (three) times daily with meals. Pt needs to keep upcoming appt in Oct for further refills, Disp: 270 tablet, Rfl: 2   Multiple Vitamins-Minerals (PRESERVISION AREDS) TABS, Take 1 tablet by mouth 2 (two) times daily. , Disp: , Rfl:    ondansetron (ZOFRAN-ODT) 4 MG disintegrating tablet, Take 1 tablet by mouth 2 (two) times daily as needed. (Patient not taking: Reported on 10/11/2023), Disp: , Rfl:    OZEMPIC, 0.25 OR 0.5 MG/DOSE, 2 MG/3ML SOPN, Inject 0.5 mg into the skin once a week., Disp: , Rfl:    tamsulosin (FLOMAX) 0.4 MG CAPS capsule, Take 0.4 mg by mouth at  bedtime., Disp: , Rfl:    traMADol (ULTRAM) 50 MG tablet, Take 1 tablet (50 mg total) by mouth every 6 (six) hours as needed for moderate pain or severe pain., Disp: 25 tablet, Rfl: 0  Allergies:  Allergies  Allergen Reactions   Minocycline Hives and Other (See Comments)   Codeine Itching, Hives and Other (See Comments)    Other reaction(s): ITCHING    Past Medical History, Surgical history, Social history, and Family History were reviewed and updated.  Review of Systems: Review of Systems  Constitutional: Negative.   HENT:  Negative.    Eyes: Negative.   Respiratory: Negative.    Cardiovascular: Negative.   Gastrointestinal: Negative.   Endocrine: Negative.   Genitourinary: Negative.    Musculoskeletal: Negative.   Skin: Negative.   Neurological: Negative.   Hematological: Negative.   Psychiatric/Behavioral: Negative.      Physical Exam:  height is 5\' 11"  (1.803 m) and weight is 233 lb (105.7 kg). His oral temperature is 98.6 F (37 C). His blood pressure is 101/55 (abnormal) and his pulse is 80. His respiration is 18 and oxygen saturation is 97%.   Wt Readings from Last 3 Encounters:  11/22/23 233 lb (105.7 kg)  10/11/23 232 lb (105.2 kg)  09/11/23 234 lb 9.6 oz (106.4 kg)    Physical Exam Vitals reviewed.  HENT:     Head: Normocephalic and atraumatic.  Eyes:     Pupils: Pupils are equal, round, and reactive to light.  Cardiovascular:     Rate and Rhythm: Normal rate and regular rhythm.     Heart sounds: Normal heart sounds.  Pulmonary:     Effort: Pulmonary effort is normal.     Breath sounds: Normal breath sounds.  Abdominal:     General: Bowel sounds are normal.     Palpations: Abdomen is soft.  Musculoskeletal:        General: No tenderness or deformity. Normal range of motion.     Cervical back: Normal range of motion.  Lymphadenopathy:     Cervical: No cervical adenopathy.  Skin:    General: Skin is warm and dry.     Findings: No erythema or rash.   Neurological:     Mental Status: He is alert and oriented to person, place, and time.  Psychiatric:        Behavior: Behavior normal.        Thought Content: Thought content normal.        Judgment: Judgment normal.      Lab Results  Component Value Date   WBC 9.6 11/22/2023   HGB 13.5 11/22/2023   HCT 42.5 11/22/2023   MCV 94.9 11/22/2023   PLT 221 11/22/2023  Chemistry      Component Value Date/Time   NA 143 11/22/2023 1324   NA 140 06/29/2020 0938   K 4.7 11/22/2023 1324   CL 109 11/22/2023 1324   CO2 26 11/22/2023 1324   BUN 23 11/22/2023 1324   BUN 30 (H) 06/29/2020 0938   CREATININE 1.45 (H) 11/22/2023 1324   CREATININE 1.30 (H) 01/30/2022 1211      Component Value Date/Time   CALCIUM 8.9 11/22/2023 1324   ALKPHOS 83 11/22/2023 1324   AST 18 11/22/2023 1324   ALT 19 11/22/2023 1324   BILITOT 0.5 11/22/2023 1324      Impression and Plan: Mr. Hinze is a very nice 78 year old white male.  He has congestive heart failure.  He developed pulmonary emboli and thromboembolic disease in his legs.  These have resolved.  I am happy about this.  However, he definitely needs to be on lifelong anticoagulation for my point of view given his congestive heart failure.  I think he does see his cardiologist soon.  He will be going up to [place] I think in March.  He did that we will then go to his summer home in [place] and I think May or June.  I would like to see him back in late April.  I want to see him back before he heads up to [place].   Josph Macho, MD 2/13/20252:38 PM

## 2023-11-22 NOTE — Telephone Encounter (Signed)
Called patient at [phone] to remind patient of his appointment with Dr. Elwyn Lade on tomorrow 11/23/23 at 9:20 AM.   Front office was unable to reach patient so they left him a voice message on his telephone number to remind him of his appointment.

## 2023-11-22 NOTE — Progress Notes (Signed)
ADVANCED HEART FAILURE FOLLOW UP CLINIC NOTE  Referring Physician: No ref. provider found  Primary Care: Pcp, No Primary Cardiologist:  HPI: Steven Beard is a 78 y.o. male who presents for follow up of ***.      Previously followed by Dr. Maryagnes Amos in [place] and moved to [place] and established with Dr. Elease Hashimoto in 2017.   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 First told he had a low EF in 2005 after gastric bypass. Was referred to Dr. Maryagnes Amos. Did not have a cath at that time. Moved to [place] in 2017.  He was originally followed by Dr. Gala Romney for congestive heart failure, his main symptoms being hypotension and fatigue.  Given his profound orthostatic hypotension he underwent workup for TTR amyloid which was unremarkable.  He was started on midodrine with improvement in his symptoms.  MRI showed ejection fraction 44% with minimal LGE and he overall has been doing well.   Patient was hospitalized for pulmonary embolism in 2024 and at that time was found to have a severely reduced ejection fraction along with new left bundle branch block.  A biventricular defibrillator was placed and he returned to [facility] for further follow-up.     SUBJECTIVE:  PMH, current medications, allergies, social history, and family history reviewed in epic.  PHYSICAL EXAM: There were no vitals filed for this visit. GENERAL: Well nourished and in no apparent distress at rest.  PULM:  Normal work of breathing, clear to auscultation bilaterally. Respirations are unlabored.  CARDIAC:  JVP: ***         Normal rate with regular rhythm. No murmurs, rubs or gallops.  *** edema. Warm and well perfused extremities. ABDOMEN: Soft, non-tender, non-distended. NEUROLOGIC: Patient is oriented x3 with no focal or lateralizing neurologic deficits.    DATA REVIEW  ECG: Atrially sensed, biventricularly paced rhythm   ECHO: Echo 4/21: EF 30-35% RV ok.    Echo 2024: Reported from [place],  evidently EF now 20 to 25% with moderate LV dilation   CATH: Cath 05/19/20: EF 25-35% Minimal nonobstructive CAD RA = 7 PA = 40/17 (26)  PCWP 15 CO/CI 7.8/3.4   cMRI 10/21: EF 44%, RV ok, very small area of subendocardial apical inferior LGE ? 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  Had been doing fairly well but was hospitalized in [place] with drop in ejection fraction and CRT-D placement.  He does report fatigue with mild exertion, euvolemic on exam and without concerning anginal symptoms so left heart cath of limited utility.  Will proceed with CPX as patient otherwise has good renal function, support and would be a an LVAD candidate if significantly limited. - CPX to evaluate ongoing fatigue (bike) - Inability to tolerate GDMT, on midodrine - Continue carvedilol 3.125 mg twice daily and Jardiance 10 mg daily - Increase midodrine as below - Repeat echocardiogram at next visit while on BiV therapy   Orthostatic hypotension: Significantly improved on midodrine but still has dizziness and fatigued with standing or sitting for long periods of time.  Suspect largely due to poor vascular tone and venous  pooling.  Orthostatic hypotension can be seen in around 5% of patients following bariatric surgery, workup for amyloid was negative. -Increase midodrine to 5 mg 3 times daily -Not on diuretics as above   Pulmonary embolism: Noted given worsening shortness of  breath in [place].  On anticoagulation. -Continue apixaban, follow-up with hematology   Severe OSA - Sleep Study on 10/21 AHA 47.3/hr - awaiting CPAP - followed by Dr. Mayford Knife   DM2 - Per PCP   Morbid obesity s/p gastric bypass  Follow up in ***  Clearnce Hasten, MD Advanced Heart Failure Mechanical Circulatory Support 11/22/23

## 2023-11-23 ENCOUNTER — Ambulatory Visit (HOSPITAL_COMMUNITY)
Admission: RE | Admit: 2023-11-23 | Discharge: 2023-11-23 | Disposition: A | Discharge status: Home / Self Care | Payer: Medicare Other | Source: Ambulatory Visit | Attending: Cardiology | Admitting: Cardiology

## 2023-11-23 ENCOUNTER — Encounter (HOSPITAL_COMMUNITY): Payer: Medicare Other

## 2023-11-23 ENCOUNTER — Other Ambulatory Visit: Payer: Self-pay

## 2023-11-23 VITALS — BP 120/79 | HR 77 | Wt 233.0 lb

## 2023-11-23 DIAGNOSIS — Z7984 Long term (current) use of oral hypoglycemic drugs: Secondary | ICD-10-CM | POA: Diagnosis not present

## 2023-11-23 DIAGNOSIS — Z7985 Long-term (current) use of injectable non-insulin antidiabetic drugs: Secondary | ICD-10-CM | POA: Diagnosis not present

## 2023-11-23 DIAGNOSIS — I5022 Chronic systolic (congestive) heart failure: Secondary | ICD-10-CM | POA: Diagnosis not present

## 2023-11-23 DIAGNOSIS — I951 Orthostatic hypotension: Secondary | ICD-10-CM | POA: Insufficient documentation

## 2023-11-23 DIAGNOSIS — Z95810 Presence of automatic (implantable) cardiac defibrillator: Secondary | ICD-10-CM | POA: Insufficient documentation

## 2023-11-23 DIAGNOSIS — I428 Other cardiomyopathies: Secondary | ICD-10-CM | POA: Diagnosis not present

## 2023-11-23 DIAGNOSIS — Z79899 Other long term (current) drug therapy: Secondary | ICD-10-CM | POA: Diagnosis present

## 2023-11-23 DIAGNOSIS — G4733 Obstructive sleep apnea (adult) (pediatric): Secondary | ICD-10-CM | POA: Insufficient documentation

## 2023-11-23 DIAGNOSIS — I2699 Other pulmonary embolism without acute cor pulmonale: Secondary | ICD-10-CM | POA: Insufficient documentation

## 2023-11-23 DIAGNOSIS — Z9884 Bariatric surgery status: Secondary | ICD-10-CM | POA: Diagnosis not present

## 2023-11-23 DIAGNOSIS — J4489 Other specified chronic obstructive pulmonary disease: Secondary | ICD-10-CM

## 2023-11-23 DIAGNOSIS — E119 Type 2 diabetes mellitus without complications: Secondary | ICD-10-CM | POA: Diagnosis not present

## 2023-11-23 DIAGNOSIS — Z86711 Personal history of pulmonary embolism: Secondary | ICD-10-CM | POA: Diagnosis not present

## 2023-11-23 MED ORDER — APIXABAN 5 MG PO TABS: 5.0000 mg | ORAL_TABLET | Freq: Two times a day (BID) | ORAL | 3 refills | Status: DC

## 2023-11-23 MED ORDER — JARDIANCE 10 MG PO TABS: 10.0000 mg | ORAL_TABLET | Freq: Every day | ORAL | 11 refills | Status: DC

## 2023-11-23 NOTE — Patient Instructions (Signed)
There has been no changes to your medications.  Your physician has requested that you have an echocardiogram. Echocardiography is a painless test that uses sound waves to create images of your heart. It provides your doctor with information about the size and shape of your heart and how well your heart's chambers and valves are working. This procedure takes approximately one hour. There are no restrictions for this procedure. Please do NOT wear cologne, perfume, aftershave, or lotions (deodorant is allowed). Please arrive 15 minutes prior to your appointment time.  Please note: We ask at that you not bring children with you during ultrasound (echo/ vascular) testing. Due to room size and safety concerns, children are not allowed in the ultrasound rooms during exams. Our front office staff cannot provide observation of children in our lobby area while testing is being conducted. An adult accompanying a patient to their appointment will only be allowed in the ultrasound room at the discretion of the ultrasound technician under special circumstances. We apologize for any inconvenience.  Your physician recommends that you schedule a follow-up appointment in: 4 months (June) ** PLEASE CALL THE OFFICE IN APRIL TO ARRANGE YOUR FOLLOW UP APPOINTMENT.**  If you have any questions or concerns before your next appointment please send [place] a message through [place] or call our office at [phone].    TO LEAVE A MESSAGE FOR THE NURSE SELECT OPTION 2, PLEASE LEAVE A MESSAGE INCLUDING: YOUR NAME DATE OF BIRTH CALL BACK NUMBER REASON FOR CALL**this is important as we prioritize the call backs  YOU WILL RECEIVE A CALL BACK THE SAME DAY AS LONG AS YOU CALL BEFORE 4:00 PM  At the [facility], you and your health needs are our priority. As part of our continuing mission to provide you with exceptional heart care, we have created designated Provider Care Teams. These Care Teams include your primary  Cardiologist (physician) and Advanced Practice Providers (APPs- Physician Assistants and Nurse Practitioners) who all work together to provide you with the care you need, when you need it.   You may see any of the following providers on your designated Care Team at your next follow up: Dr Arvilla Meres Dr Marca Ancona Dr. Dorthula Nettles Dr. Clearnce Hasten Amy Filbert Schilder, NP Robbie Lis, [place] [facility] [place], [place] Brynda Peon, NP [place] Lee, NP Karle Plumber, PharmD   Please be sure to bring in all your medications bottles to every appointment.    Thank you for choosing [facility]

## 2023-11-26 ENCOUNTER — Encounter (HOSPITAL_COMMUNITY)
Admission: RE | Admit: 2023-11-26 | Discharge: 2023-11-26 | Disposition: A | Discharge status: Home / Self Care | Payer: Medicare Other | Source: Ambulatory Visit | Attending: Cardiovascular Disease | Admitting: Cardiovascular Disease

## 2023-11-26 DIAGNOSIS — Z5189 Encounter for other specified aftercare: Secondary | ICD-10-CM | POA: Diagnosis not present

## 2023-11-26 DIAGNOSIS — I5022 Chronic systolic (congestive) heart failure: Secondary | ICD-10-CM

## 2023-11-27 ENCOUNTER — Ambulatory Visit (HOSPITAL_COMMUNITY)
Admission: RE | Admit: 2023-11-27 | Discharge: 2023-11-27 | Disposition: A | Discharge status: Home / Self Care | Payer: Medicare Other | Source: Ambulatory Visit | Attending: Neurological Surgery | Admitting: Neurological Surgery

## 2023-11-27 ENCOUNTER — Ambulatory Visit (HOSPITAL_COMMUNITY)
Admission: RE | Admit: 2023-11-27 | Discharge: 2023-11-27 | Disposition: A | Discharge status: Home / Self Care | Payer: Medicare Other | Source: Ambulatory Visit | Attending: Pulmonary Disease | Admitting: Pulmonary Disease

## 2023-11-27 ENCOUNTER — Other Ambulatory Visit (HOSPITAL_COMMUNITY): Payer: Self-pay | Admitting: Neurological Surgery

## 2023-11-27 DIAGNOSIS — D334 Benign neoplasm of spinal cord: Secondary | ICD-10-CM | POA: Diagnosis present

## 2023-11-27 MED ORDER — GADOBUTROL 1 MMOL/ML IV SOLN
10.0000 mL | Freq: Once | INTRAVENOUS | Status: AC | PRN
  Administered 2023-11-27: 10 mL via INTRAVENOUS

## 2023-11-28 ENCOUNTER — Encounter (HOSPITAL_COMMUNITY)
Admission: RE | Admit: 2023-11-28 | Discharge: 2023-11-28 | Disposition: A | Discharge status: Home / Self Care | Payer: Medicare Other | Source: Ambulatory Visit | Attending: Cardiovascular Disease | Admitting: Cardiovascular Disease

## 2023-11-28 DIAGNOSIS — Z5189 Encounter for other specified aftercare: Secondary | ICD-10-CM | POA: Diagnosis not present

## 2023-11-28 DIAGNOSIS — I5022 Chronic systolic (congestive) heart failure: Secondary | ICD-10-CM

## 2023-11-29 LAB — CUP PACEART REMOTE DEVICE CHECK
Battery Remaining Longevity: 124 mo
Battery Voltage: 3.02 V
Brady Statistic RV Percent Paced: 0 %
Date Time Interrogation Session: 20250209193824
HighPow Impedance: 72 Ohm
Implantable Lead Connection Status: 753985
Implantable Lead Connection Status: 753985
Implantable Lead Connection Status: 753985
Implantable Lead Implant Date: 20240710
Implantable Lead Implant Date: 20240710
Implantable Lead Implant Date: 20240710
Implantable Lead Location: 753858
Implantable Lead Location: 753859
Implantable Lead Location: 753860
Implantable Lead Model: 3830
Implantable Lead Model: 3830
Implantable Pulse Generator Implant Date: 20240710
Lead Channel Impedance Value: 209 Ohm
Lead Channel Impedance Value: 285 Ohm
Lead Channel Impedance Value: 418 Ohm
Lead Channel Impedance Value: 437 Ohm
Lead Channel Impedance Value: 456 Ohm
Lead Channel Impedance Value: 589 Ohm
Lead Channel Pacing Threshold Amplitude: 0.75 V
Lead Channel Pacing Threshold Pulse Width: 0.4 ms
Lead Channel Sensing Intrinsic Amplitude: 1.1 mV
Lead Channel Sensing Intrinsic Amplitude: 31.1 mV
Lead Channel Setting Pacing Amplitude: 1.75 V
Lead Channel Setting Pacing Amplitude: 2.5 V
Lead Channel Setting Pacing Pulse Width: 0.4 ms
Lead Channel Setting Sensing Sensitivity: 0.3 mV
Zone Setting Status: 755011
Zone Setting Status: 755011
Zone Setting Status: 755011

## 2023-12-03 ENCOUNTER — Encounter (HOSPITAL_COMMUNITY)
Admission: RE | Admit: 2023-12-03 | Discharge: 2023-12-03 | Disposition: A | Discharge status: Home / Self Care | Payer: Medicare Other | Source: Ambulatory Visit | Attending: Cardiovascular Disease | Admitting: Cardiovascular Disease

## 2023-12-03 DIAGNOSIS — Z5189 Encounter for other specified aftercare: Secondary | ICD-10-CM | POA: Diagnosis not present

## 2023-12-03 DIAGNOSIS — I5022 Chronic systolic (congestive) heart failure: Secondary | ICD-10-CM

## 2023-12-04 ENCOUNTER — Ambulatory Visit: Payer: Medicare Other | Admitting: Cardiovascular Disease

## 2023-12-04 ENCOUNTER — Encounter: Payer: Self-pay | Admitting: Cardiovascular Disease

## 2023-12-05 ENCOUNTER — Encounter (HOSPITAL_COMMUNITY)
Admission: RE | Admit: 2023-12-05 | Discharge: 2023-12-05 | Discharge status: Home / Self Care | Payer: Medicare Other | Source: Ambulatory Visit | Attending: Cardiovascular Disease | Admitting: Cardiovascular Disease

## 2023-12-05 DIAGNOSIS — Z5189 Encounter for other specified aftercare: Secondary | ICD-10-CM | POA: Diagnosis not present

## 2023-12-05 DIAGNOSIS — I5022 Chronic systolic (congestive) heart failure: Secondary | ICD-10-CM

## 2023-12-06 ENCOUNTER — Other Ambulatory Visit (HOSPITAL_COMMUNITY): Payer: Self-pay | Admitting: Cardiology

## 2023-12-10 ENCOUNTER — Encounter (HOSPITAL_COMMUNITY)
Admission: RE | Admit: 2023-12-10 | Discharge: 2023-12-10 | Disposition: A | Discharge status: Home / Self Care | Payer: Medicare Other | Source: Ambulatory Visit | Attending: Cardiovascular Disease | Admitting: Cardiovascular Disease

## 2023-12-10 DIAGNOSIS — I5022 Chronic systolic (congestive) heart failure: Secondary | ICD-10-CM | POA: Insufficient documentation

## 2023-12-11 NOTE — Progress Notes (Signed)
 Cardiac Individual Treatment Plan  Patient Details  Name: Steven Beard MRN: [id number] Date of Birth: 1946/01/05 Referring Provider:   Flowsheet Row INTENSIVE CARDIAC REHAB ORIENT from 09/03/2023 in [facility], Vascular, & Lung Health  Referring Provider Kristeen Miss, MD       Initial Encounter Date:  Flowsheet Row INTENSIVE CARDIAC REHAB ORIENT from 09/03/2023 in [facility] for Heart, Vascular, & Lung Health  Date 09/03/23       Visit Diagnosis: Heart failure, chronic systolic (HCC)  Patient's Home Medications on Admission:  Current Outpatient Medications:    ACCU-CHEK GUIDE test strip, 1 (ONE) STRIP IN VITRO TWO TIMES DAILY (Patient not taking: Reported on 10/11/2023), Disp: , Rfl:    Accu-Chek Softclix Lancets lancets, SMARTSIG:Topical (Patient not taking: Reported on 10/11/2023), Disp: , Rfl:    acetaminophen (TYLENOL) 500 MG tablet, Take 1,000 mg by mouth every 6 (six) hours as needed for moderate pain or headache., Disp: , Rfl:    ALPRAZolam (XANAX) 1 MG tablet, Take 1 mg by mouth at bedtime., Disp: , Rfl:    apixaban (ELIQUIS) 5 MG TABS tablet, Take 1 tablet (5 mg total) by mouth 2 (two) times daily., Disp: 60 tablet, Rfl: 3   Ascorbic Acid (VITAMIN C PO), Take by mouth daily., Disp: , Rfl:    Blood Glucose Monitoring Suppl (ACCU-CHEK GUIDE ME) w/Device KIT, 1 (ONE) KIT AS DIRECETD (Patient not taking: Reported on 10/11/2023), Disp: , Rfl:    buPROPion (WELLBUTRIN XL) 150 MG 24 hr tablet, Take 150 mg by mouth daily., Disp: , Rfl:    buPROPion (WELLBUTRIN XL) 300 MG 24 hr tablet, Take 300 mg by mouth daily. Patient says dose was increased to 450 mg once a day, Disp: , Rfl:    Calcium Citrate-Vitamin D (CALCIUM CITRATE + PO), Take by mouth daily., Disp: , Rfl:    carvedilol (COREG) 3.125 MG tablet, TAKE 1 TABLET BY MOUTH TWICE  DAILY WITH MEALS, Disp: 180 tablet, Rfl: 3   Cholecalciferol (VITAMIN D3) 50 MCG (2000 UT)  capsule, Take 1 capsule by mouth every morning., Disp: , Rfl:    cyanocobalamin (,VITAMIN B-12,) 1000 MCG/ML injection, Inject 1,000 mcg into the muscle every 30 (thirty) days., Disp: , Rfl:    finasteride (PROSCAR) 5 MG tablet, Take 5 mg by mouth at bedtime., Disp: , Rfl:    fluorouracil (EFUDEX) 5 % cream, Apply topically 2 (two) times daily., Disp: , Rfl:    JARDIANCE 10 MG TABS tablet, Take 1 tablet (10 mg total) by mouth daily., Disp: 30 tablet, Rfl: 11   levothyroxine (SYNTHROID, LEVOTHROID) 175 MCG tablet, Take 175 mcg by mouth See admin instructions. Take Monday - Saturday, Disp: , Rfl:    midodrine (PROAMATINE) 2.5 MG tablet, Take 2 tablets (5 mg total) by mouth 3 (three) times daily with meals., Disp: 540 tablet, Rfl: 3   Multiple Vitamins-Minerals (PRESERVISION AREDS) TABS, Take 1 tablet by mouth 2 (two) times daily. , Disp: , Rfl:    ondansetron (ZOFRAN-ODT) 4 MG disintegrating tablet, Take 1 tablet by mouth 2 (two) times daily as needed., Disp: , Rfl:    OZEMPIC, 0.25 OR 0.5 MG/DOSE, 2 MG/3ML SOPN, Inject 0.5 mg into the skin once a week., Disp: , Rfl:    tamsulosin (FLOMAX) 0.4 MG CAPS capsule, Take 0.4 mg by mouth at bedtime., Disp: , Rfl:    traMADol (ULTRAM) 50 MG tablet, Take 1 tablet (50 mg total) by mouth every 6 (six)  hours as needed for moderate pain or severe pain., Disp: 25 tablet, Rfl: 0  Past Medical History: Past Medical History:  Diagnosis Date   Hidradenitis    History of BPH    benign prostatic hypertrophy   History of hydrocele    in right testicle    Hypothyroidism    NICM (nonischemic cardiomyopathy) (HCC)    OSA (obstructive sleep apnea) 12/08/2020   Severe OSA with an AHI of 47.3/hr with mild central sleep apnea with pAHIc of 6.7/hr and nocturnal hypoxemia with a minimum O2 sat of 78% and < 88% for 15 minutes c/w nocturnal hypoxemia.   uses cpap nightly   S/P gastric bypass     Tobacco Use: Social History   Tobacco Use  Smoking Status Never   Passive  exposure: Never  Smokeless Tobacco Never    Labs: Review Flowsheet       Latest Ref Rng & Units 05/19/2020  Labs for ITP Cardiac and Pulmonary Rehab  PH, Arterial 7.350 - 7.450 7.383   PCO2 arterial 32.0 - 48.0 mmHg 42.6   Bicarbonate 20.0 - 28.0 mmol/L 26.9  25.3   TCO2 22 - 32 mmol/L 28  27   O2 Saturation % 66.0  90.0     Details       Multiple values from one day are sorted in reverse-chronological order         Capillary Blood Glucose: Lab Results  Component Value Date   GLUCAP 92 11/14/2023   GLUCAP 152 (H) 10/15/2023   GLUCAP 132 (H) 10/15/2023   GLUCAP 94 09/12/2023   GLUCAP 109 (H) 09/12/2023     Exercise Target Goals: Exercise Program Goal: Individual exercise prescription set using results from initial 6 min walk test and THRR while considering  patient's activity barriers and safety.   Exercise Prescription Goal: Initial exercise prescription builds to 30-45 minutes a day of aerobic activity, 2-3 days per week.  Home exercise guidelines will be given to patient during program as part of exercise prescription that the participant will acknowledge.  Activity Barriers & Risk Stratification:  Activity Barriers & Cardiac Risk Stratification - 09/03/23 1311       Activity Barriers & Cardiac Risk Stratification   Activity Barriers Deconditioning;Decreased Ventricular Function;Balance Concerns;Back Problems    Cardiac Risk Stratification High   <5 METs on            6 Minute Walk:  6 Minute Walk     Row Name 09/03/23 1310         6 Minute Walk   Phase Initial     Distance 600 feet     Walk Time 6 minutes     # of Rest Breaks 3  1:30-2:45, 3:15-4:00, 5:15-6:00 due to over THRR     MPH 1.14     METS 1.07     RPE 8     Perceived Dyspnea  0     VO2 Peak 3.73     Symptoms Yes (comment)     Comments 3 rest breaks due to exceeding THRR, no pain     Resting HR 85 bpm     Resting BP 96/58     Resting Oxygen Saturation  95 %     Exercise  Oxygen Saturation  during 6 min walk 96 %     Max Ex. HR 119 bpm     Max Ex. BP 117/60     2 Minute Post BP 113/75  Oxygen Initial Assessment:   Oxygen Re-Evaluation:   Oxygen Discharge (Final Oxygen Re-Evaluation):   Initial Exercise Prescription:  Initial Exercise Prescription - 09/03/23 1300       Date of Initial Exercise RX and Referring Provider   Date 09/03/23    Referring Provider Kristeen Miss, MD    Expected Discharge Date 11/21/23      Recumbant Bike   Level 1    RPM 50    Watts 35    Minutes 15    METs 2      Track   Laps 13    Minutes 15    METs 2      Prescription Details   Frequency (times per week) 3    Duration Progress to 30 minutes of continuous aerobic without signs/symptoms of physical distress      Intensity   THRR 40-80% of Max Heartrate 57-114    Ratings of Perceived Exertion 11-13    Perceived Dyspnea 0-4      Progression   Progression Continue progressive overload as per policy without signs/symptoms or physical distress.      Resistance Training   Training Prescription Yes    Weight 3    Reps 10-15             Perform Capillary Blood Glucose checks as needed.  Exercise Prescription Changes:   Exercise Prescription Changes     Row Name 09/10/23 1029 09/12/23 1021 09/17/23 1028 10/15/23 1022 10/29/23 1031     Response to Exercise   Blood Pressure (Admit) 102/68 108/66 120/70 120/70 120/70   Blood Pressure (Exercise) 112/66 118/62 120/64 120/68 120/68   Blood Pressure (Exit) 92/70 111/73 92/67 92/65  92/65   Heart Rate (Admit) 73 bpm 85 bpm 86 bpm 84 bpm 84 bpm   Heart Rate (Exercise) 130 bpm 107 bpm 129 bpm 100 bpm 100 bpm   Heart Rate (Exit) 78 bpm 78 bpm 93 bpm 77 bpm 77 bpm   Rating of Perceived Exertion (Exercise) 11 11 11 11 11    Symptoms None None Fatigue None None   Comments Elevated heart rate with walking. Moved to NuStep. Moved to NuStep for 30 minutes. Modified warm-up to seated so as not to  get too tired. Stopped early due to fatigue. -- Increased workload on NuStep today level 3 the last 5 minutes.   Duration Progress to 30 minutes of  aerobic without signs/symptoms of physical distress Progress to 30 minutes of  aerobic without signs/symptoms of physical distress Progress to 30 minutes of  aerobic without signs/symptoms of physical distress Progress to 30 minutes of  aerobic without signs/symptoms of physical distress Progress to 30 minutes of  aerobic without signs/symptoms of physical distress   Intensity THRR unchanged THRR New  Up to 130 bpm. THRR unchanged  Up to 130 bpm. THRR unchanged  Up to 130 bpm. THRR unchanged  Up to 130 bpm.     Progression   Progression Continue to progress workloads to maintain intensity without signs/symptoms of physical distress. Continue to progress workloads to maintain intensity without signs/symptoms of physical distress. Continue to progress workloads to maintain intensity without signs/symptoms of physical distress. Continue to progress workloads to maintain intensity without signs/symptoms of physical distress. Continue to progress workloads to maintain intensity without signs/symptoms of physical distress.   Average METs 2 2.1 2.3 2.4 2.4     Resistance Training   Training Prescription Yes No  relaxation day, no weights. Yes Yes Yes   Weight 3 --  3 lbs 3 lbs 3 lbs   Reps 10-15 -- 10-15 10-15 10-15   Time 10 Minutes -- 10 Minutes 10 Minutes 10 Minutes     Interval Training   Interval Training No No No No No     NuStep   Level 1 1 2 2 2    SPM 75 93 90 104 104   Minutes 20 30 15 30 30    METs 1.9 2.1 2.1 2.4 --     Track   Laps 4  880 ft -- 10 -- --   Minutes 7 -- 13 -- --   METs 2.09 -- 2.47 -- --    Row Name 11/12/23 1024 11/26/23 1022 12/10/23 1026         Response to Exercise   Blood Pressure (Admit) 108/64 112/66 104/62     Blood Pressure (Exit) 112/72 94/54 94/62      Heart Rate (Admit) 86 bpm 76 bpm 91 bpm     Heart Rate  (Exercise) 103 bpm 106 bpm 111 bpm     Heart Rate (Exit) 94 bpm 85 bpm 98 bpm     Rating of Perceived Exertion (Exercise) 11 9 13      Symptoms None None None     Comments Exercising on the NuStep for 20 minutes at level 2 and 10 minutes and level 3. -- --     Duration Progress to 30 minutes of  aerobic without signs/symptoms of physical distress Progress to 30 minutes of  aerobic without signs/symptoms of physical distress Progress to 30 minutes of  aerobic without signs/symptoms of physical distress     Intensity THRR unchanged  Up to 130 bpm. THRR unchanged  Up to 130 bpm. THRR unchanged  Up to 130 bpm.       Progression   Progression Continue to progress workloads to maintain intensity without signs/symptoms of physical distress. Continue to progress workloads to maintain intensity without signs/symptoms of physical distress. Continue to progress workloads to maintain intensity without signs/symptoms of physical distress.     Average METs 2.8 2.5 2.8       Resistance Training   Training Prescription Yes Yes Yes     Weight 3 lbs 3 lbs 3 lbs     Reps 10-15 10-15 10-15     Time 10 Minutes 10 Minutes 10 Minutes       Interval Training   Interval Training No No No       NuStep   Level 3  20 minutes level 2, 10 minutes level 3. 3  15 minutes level 2, 15 minutes level 3. 4  15 minutes level 3, 15 minutes level 4.     SPM -- 98 101     Minutes 30 30 30      METs 2.8 2.5 2.8       Home Exercise Plan   Plans to continue exercise at Home (comment)  Walking Home (comment)  Walking Home (comment)  Walking     Frequency Add 3 additional days to program exercise sessions. Add 3 additional days to program exercise sessions. Add 3 additional days to program exercise sessions.     Initial Home Exercises Provided 11/07/23 11/07/23 11/07/23              Exercise Comments:   Exercise Comments     Row Name 09/10/23 1136 09/12/23 1021 10/15/23 1123 10/17/23 1109 11/07/23 1105   Exercise  Comments Darryon tolerated low intensity exercise fair without symptoms. Tauno's heart rate was elevated  walking the track. Switched to the NuStep. Will continue to monitor. Switched patient to NuStep 30 minutes. Drew returned to exercise today and tolerated well without symptoms. Reviewed goals with Freida Busman. Reviewed home exercise guidelines and goals with Freida Busman.    Row Name 12/05/23 1137           Exercise Comments Reviewed goals with Freida Busman.                Exercise Goals and Review:   Exercise Goals     Row Name 09/03/23 1118             Exercise Goals   Increase Physical Activity Yes       Intervention Provide advice, education, support and counseling about physical activity/exercise needs.;Develop an individualized exercise prescription for aerobic and resistive training based on initial evaluation findings, risk stratification, comorbidities and participant's personal goals.       Expected Outcomes Short Term: Attend rehab on a regular basis to increase amount of physical activity.;Long Term: Exercising regularly at least 3-5 days a week.;Long Term: Add in home exercise to make exercise part of routine and to increase amount of physical activity.       Increase Strength and Stamina Yes       Intervention Provide advice, education, support and counseling about physical activity/exercise needs.;Develop an individualized exercise prescription for aerobic and resistive training based on initial evaluation findings, risk stratification, comorbidities and participant's personal goals.       Expected Outcomes Short Term: Perform resistance training exercises routinely during rehab and add in resistance training at home;Long Term: Improve cardiorespiratory fitness, muscular endurance and strength as measured by increased METs and functional capacity ( );Short Term: Increase workloads from initial exercise prescription for resistance, speed, and METs.       Able to understand and use rate  of perceived exertion (RPE) scale Yes       Intervention Provide education and explanation on how to use RPE scale       Expected Outcomes Short Term: Able to use RPE daily in rehab to express subjective intensity level;Long Term:  Able to use RPE to guide intensity level when exercising independently       Knowledge and understanding of Target Heart Rate Range (THRR) Yes       Intervention Provide education and explanation of THRR including how the numbers were predicted and where they are located for reference       Expected Outcomes Short Term: Able to state/look up THRR;Short Term: Able to use daily as guideline for intensity in rehab;Long Term: Able to use THRR to govern intensity when exercising independently       Understanding of Exercise Prescription Yes       Intervention Provide education, explanation, and written materials on patient's individual exercise prescription       Expected Outcomes Short Term: Able to explain program exercise prescription;Long Term: Able to explain home exercise prescription to exercise independently                Exercise Goals Re-Evaluation :  Exercise Goals Re-Evaluation     Row Name 09/10/23 1136 09/12/23 1333 09/17/23 1028 10/15/23 1123 10/17/23 1109     Exercise Goal Re-Evaluation   Exercise Goals Review Increase Physical Activity;Increase Strength and Stamina;Able to understand and use rate of perceived exertion (RPE) scale Increase Physical Activity;Increase Strength and Stamina;Able to understand and use rate of perceived exertion (RPE) scale Increase Physical Activity;Increase Strength and Stamina;Able to understand and use  rate of perceived exertion (RPE) scale Increase Physical Activity;Increase Strength and Stamina;Able to understand and use rate of perceived exertion (RPE) scale Increase Physical Activity;Increase Strength and Stamina;Able to understand and use rate of perceived exertion (RPE) scale   Comments Jerin was able to understand  and use RPE scale appropriately. Received clearance to increase THRR  up to 130s. Patient had been on the NuStep for 30 minutes due to elevated heart rate with walking. Received clearance from his cardiologist to increase THRR and resumed walking the track today. He tolerated fair, stopping early due to fatigue. Jeno returned to exercise today after being out due to a fall at home on 09/17/23. Tolerated exercise well without symptoms. Tracer does feel like he's progressing yet with exercise. He expressed to me that the dynamic aerobic warm-up and stretches that we do tires him and decreases what he's able to do on the NuStep machine. He would like to walk to warm-up instead of the current warm-up routine, so he has more energy for the NuStep. Will make this change to his exercise routine at his next session on Monday. He is walking 15 minutes, 2-3 day/week at home, but his walking is limited by back pain from spinal stenosis. He states he will be able to increase his walking duration as he gets stronger.   Expected Outcomes Increase duration to achieve 30 minutes. Progress workloads as tolerated to help increase cardiorespiratory fitness. Will progress workloads to keep heart rate within limits. Will continue to progress as tolerated. Progress workloads gradually as tolerated. Cleto will walk the track to warm-up prior to his first station. As he gets stronger, he will increase his walking duration at home.    Row Name 11/07/23 1105 12/05/23 1137           Exercise Goal Re-Evaluation   Exercise Goals Review Increase Physical Activity;Increase Strength and Stamina;Able to understand and use rate of perceived exertion (RPE) scale;Understanding of Exercise Prescription Increase Physical Activity;Increase Strength and Stamina;Able to understand and use rate of perceived exertion (RPE) scale;Understanding of Exercise Prescription      Comments Maximiliano still feels like exercise in the program is helping him. He  says using the NuStep machine, his legs feel stronger. He says exercising here has given him confidence that he can exercise without having a cardiac issue. Reviewed exercise prescription with him, and he continues to walk 10-15 minutes 2-3 days/week. He has increased his level on the NuStep. He was exercising at level 2 for 25 minutes and level 3 for 5 minutes. Today he exercised at level 2 for 10 minutes and level 3 for 20 minutes. Rayvon continues to progress well with exercise. On the NuStep, he's up to level 3 for 25 minutes, level 4 for 5 minutes. His goal is to achieve 30 minutes at level 5. He feels his legs are stronger now. He walks at home in addition to exercise at cardiac rehab. He wants to continue exercise after graduation. Will look into community options.      Expected Outcomes Milano will continue walking at home in addition to progressing workloads at cardiac rehab. Continue current exercise progression.               Discharge Exercise Prescription (Final Exercise Prescription Changes):  Exercise Prescription Changes - 12/10/23 1026       Response to Exercise   Blood Pressure (Admit) 104/62    Blood Pressure (Exit) 94/62    Heart Rate (Admit) 91 bpm  Heart Rate (Exercise) 111 bpm    Heart Rate (Exit) 98 bpm    Rating of Perceived Exertion (Exercise) 13    Symptoms None    Duration Progress to 30 minutes of  aerobic without signs/symptoms of physical distress    Intensity THRR unchanged   Up to 130 bpm.     Progression   Progression Continue to progress workloads to maintain intensity without signs/symptoms of physical distress.    Average METs 2.8      Resistance Training   Training Prescription Yes    Weight 3 lbs    Reps 10-15    Time 10 Minutes      Interval Training   Interval Training No      NuStep   Level 4   15 minutes level 3, 15 minutes level 4.   SPM 101    Minutes 30    METs 2.8      Home Exercise Plan   Plans to continue exercise at Home  (comment)   Walking   Frequency Add 3 additional days to program exercise sessions.    Initial Home Exercises Provided 11/07/23             Nutrition:  Target Goals: Understanding of nutrition guidelines, daily intake of sodium 1500mg , cholesterol 200mg , calories 30% from fat and 7% or less from saturated fats, daily to have 5 or more servings of fruits and vegetables.  Biometrics:  Pre Biometrics - 09/03/23 1121       Pre Biometrics   Waist Circumference 48 inches    Hip Circumference 53 inches    Waist to Hip Ratio 0.91 %    Triceps Skinfold 27 mm    % Body Fat 35.7 %    Grip Strength 30 kg    Flexibility 0 in   not done due to back problems   Single Leg Stand 1.5 seconds              Nutrition Therapy Plan and Nutrition Goals:  Nutrition Therapy & Goals - 12/10/23 0832       Nutrition Therapy   Diet Heart Healthy/Carbohydrate Consistent Diet      Personal Nutrition Goals   Nutrition Goal Patient to identify strategies for reducing cardiovascular risk by attending the [employer] education and nutrition series weekly.   Goal not met.   Personal Goal #2 Patient to improve diet quality by using the plate method as a guide for meal planning to include lean protein/plant protein, fruits, vegetables, whole grains, nonfat dairy as part of a well-balanced diet.   goal in progress.   Personal Goal #3 Patient to identify strategies for weight loss of 0.5-2.0# per week.   goal not met.   Comments Goals not met. Valton has medical history of DM2, Chronic systolic heart failure, gastric bypass. He continues ozempic for weight loss/DM2; weight loss goals have not been met. He is down 3.1# since starting with our program. He does not attend the [employer] education series. He does continue regular follow-up with [facility]. A1c is at goal of <7%. Patient will continue to benefit from participation in intensive cardiac rehab for nutrition, exercise, and lifestyle  modification.      Intervention Plan   Intervention Prescribe, educate and counsel regarding individualized specific dietary modifications aiming towards targeted core components such as weight, hypertension, lipid management, diabetes, heart failure and other comorbidities.;Nutrition handout(s) given to patient.    Expected Outcomes Short Term Goal: Understand basic principles  of dietary content, such as calories, fat, sodium, cholesterol and nutrients.;Long Term Goal: Adherence to prescribed nutrition plan.             Nutrition Assessments:  MEDIFICTS Score Key: >=70 Need to make dietary changes  40-70 Heart Healthy Diet <= 40 Therapeutic Level Cholesterol Diet   Flowsheet Row INTENSIVE CARDIAC REHAB from 11/19/2023 in [facility] for Heart, Vascular, & Lung Health  Picture Your Plate Total Score on Admission 44      Picture Your Plate Scores: <84 Unhealthy dietary pattern with much room for improvement. 41-50 Dietary pattern unlikely to meet recommendations for good health and room for improvement. 51-60 More healthful dietary pattern, with some room for improvement.  >60 Healthy dietary pattern, although there may be some specific behaviors that could be improved.    Nutrition Goals Re-Evaluation:  Nutrition Goals Re-Evaluation     Row Name 12/10/23 0832             Goals   Current Weight 233 lb 11 oz (106 kg)       Comment A1c 7.0, LDL 89, GFR 50, Cr 1.45       Expected Outcome Goals not met. Jaymian has medical history of DM2, Chronic systolic heart failure, gastric bypass. He continues ozempic for weight loss/DM2; weight loss goals have not been met. He is down 3.1# since starting with our program. He does not attend the [employer] education series. He does continue regular follow-up with the [facility]. A1c is at goal of <7%. Patient will continue to benefit from participation in intensive cardiac rehab for nutrition, exercise,  and lifestyle modification.                Nutrition Goals Re-Evaluation:  Nutrition Goals Re-Evaluation     Row Name 12/10/23 0832             Goals   Current Weight 233 lb 11 oz (106 kg)       Comment A1c 7.0, LDL 89, GFR 50, Cr 1.45       Expected Outcome Goals not met. Aren has medical history of DM2, Chronic systolic heart failure, gastric bypass. He continues ozempic for weight loss/DM2; weight loss goals have not been met. He is down 3.1# since starting with our program. He does not attend the [employer] education series. He does continue regular follow-up with the [facility]. A1c is at goal of <7%. Patient will continue to benefit from participation in intensive cardiac rehab for nutrition, exercise, and lifestyle modification.                Nutrition Goals Discharge (Final Nutrition Goals Re-Evaluation):  Nutrition Goals Re-Evaluation - 12/10/23 6962       Goals   Current Weight 233 lb 11 oz (106 kg)    Comment A1c 7.0, LDL 89, GFR 50, Cr 1.45    Expected Outcome Goals not met. Gared has medical history of DM2, Chronic systolic heart failure, gastric bypass. He continues ozempic for weight loss/DM2; weight loss goals have not been met. He is down 3.1# since starting with our program. He does not attend the Pritikin education series. He does continue regular follow-up with [facility]. A1c is at goal of <7%. Patient will continue to benefit from participation in intensive cardiac rehab for nutrition, exercise, and lifestyle modification.             Psychosocial: Target Goals: Acknowledge presence or  absence of significant depression and/or stress, maximize coping skills, provide positive support system. Participant is able to verbalize types and ability to use techniques and skills needed for reducing stress and depression.  Initial Review & Psychosocial Screening:  Initial Psych Review & Screening - 09/03/23 1131        Initial Review   Current issues with Current Depression;Current Stress Concerns    Source of Stress Concerns Chronic Illness    Comments Adonis shared that he has had some depression about his recent decline in heart function. He stated that he has been on Wellbutrin for quite a while, however doesn't feel it is working well. Encouraged to discuss with PCP. Demir denies any need for additional resources, stating "at the end of the day, it's up to me to fix".      Family Dynamics   Good Support System? Yes   Billyjoe has his wife for support     Barriers   Psychosocial barriers to participate in program The patient should benefit from training in stress management and relaxation.      Screening Interventions   Interventions Provide feedback about the scores to participant;To provide support and resources with identified psychosocial needs;Encouraged to exercise    Expected Outcomes Short Term goal: Identification and review with participant of any Quality of Life or Depression concerns found by scoring the questionnaire.;Long Term Goal: Stressors or current issues are controlled or eliminated.;Long Term goal: The participant improves quality of Life and PHQ9 Scores as seen by post scores and/or verbalization of changes             Quality of Life Scores:  Quality of Life - 09/03/23 1143       Quality of Life   Select Quality of Life      Quality of Life Scores   Health/Function Pre 20.07 %    Socioeconomic Pre 24.83 %    Psych/Spiritual Pre 21.86 %    Family Pre 30 %    GLOBAL Pre 22.82 %            Scores of 19 and below usually indicate a poorer quality of life in these areas.  A difference of  2-3 points is a clinically meaningful difference.  A difference of 2-3 points in the total score of the Quality of Life Index has been associated with significant improvement in overall quality of life, self-image, physical symptoms, and general health in studies assessing change in  quality of life.  PHQ-9: Review Flowsheet       09/03/2023  Depression screen PHQ 2/9  Decreased Interest 1  Down, Depressed, Hopeless 1  PHQ - 2 Score 2  Altered sleeping 1  Tired, decreased energy 3  Change in appetite 0  Feeling bad or failure about yourself  0  Trouble concentrating 0  Moving slowly or fidgety/restless 0  Suicidal thoughts 0  PHQ-9 Score 6  Difficult doing work/chores Not difficult at all   Interpretation of Total Score  Total Score Depression Severity:  1-4 = Minimal depression, 5-9 = Mild depression, 10-14 = Moderate depression, 15-19 = Moderately severe depression, 20-27 = Severe depression   Psychosocial Evaluation and Intervention:   Psychosocial Re-Evaluation:  Psychosocial Re-Evaluation     Row Name 09/11/23 0905 09/18/23 1337 10/15/23 1331 11/08/23 1518 12/11/23 0851     Psychosocial Re-Evaluation   Current issues with Current Stress Concerns;Current Depression Current Stress Concerns;Current Depression Current Stress Concerns;Current Depression Current Stress Concerns;Current Depression Current Stress Concerns;Current Depression  Comments Crespin did not voice any increased concerns or stressors on his first day of exercise. Will discuss PHq2-9 in the upcoming week Darryel says he is dissatisfied with his health due to his heart failure diagnosis. Rylei did not voice any increased concerns or stressors upon return to cardiac rehab on 10/15/23 Kristion did not voice any increased concerns or stressors during exercise at  cardiac rehab Chord  continues not to voice any increased concerns or stressors during exercise at  cardiac rehab. Javarian will complete cardiac rehab on 12/31/23.   Expected Outcomes Jyren will have controlled or decreased depression/ stress upon completion of cardiac rehab Nekhi will have controlled or decreased depression/ stress upon completion of cardiac rehab [place] will have controlled or decreased depression/ stress upon completion of  cardiac rehab [place] will have controlled or decreased depression/ stress upon completion of cardiac rehab [place] will have controlled or decreased depression/ stress upon completion of cardiac rehab   Interventions Stress management education;Encouraged to attend Cardiac Rehabilitation for the exercise;Relaxation education Stress management education;Encouraged to attend Cardiac Rehabilitation for the exercise;Relaxation education Stress management education;Encouraged to attend Cardiac Rehabilitation for the exercise;Relaxation education Stress management education;Encouraged to attend Cardiac Rehabilitation for the exercise;Relaxation education Stress management education;Encouraged to attend Cardiac Rehabilitation for the exercise;Relaxation education   Continue Psychosocial Services  No Follow up required Follow up required by staff Follow up required by staff No Follow up required No Follow up required     Initial Review   Source of Stress Concerns Chronic Illness;Unable to participate in former interests or hobbies;Unable to perform yard/household activities Chronic Illness;Unable to participate in former interests or hobbies;Unable to perform yard/household activities Chronic Illness;Unable to participate in former interests or hobbies;Unable to perform yard/household activities Chronic Illness;Unable to participate in former interests or hobbies;Unable to perform yard/household activities Chronic Illness;Unable to participate in former interests or hobbies;Unable to perform yard/household activities   Comments Will continue to monitor and offer support as needed. Will continue to monitor and offer support as needed. Will continue to monitor and offer support as needed. Will continue to monitor and offer support as needed. Will continue to monitor and offer support as needed.            Psychosocial Discharge (Final Psychosocial Re-Evaluation):  Psychosocial Re-Evaluation - 12/11/23 0851        Psychosocial Re-Evaluation   Current issues with Current Stress Concerns;Current Depression    Comments Zuriel  continues not to voice any increased concerns or stressors during exercise at  cardiac rehab. Dailan will complete cardiac rehab on 12/31/23.    Expected Outcomes Hal will have controlled or decreased depression/ stress upon completion of cardiac rehab    Interventions Stress management education;Encouraged to attend Cardiac Rehabilitation for the exercise;Relaxation education    Continue Psychosocial Services  No Follow up required      Initial Review   Source of Stress Concerns Chronic Illness;Unable to participate in former interests or hobbies;Unable to perform yard/household activities    Comments Will continue to monitor and offer support as needed.             Vocational Rehabilitation: Provide vocational rehab assistance to qualifying candidates.   Vocational Rehab Evaluation & Intervention:  Vocational Rehab - 09/03/23 1120       Initial Vocational Rehab Evaluation & Intervention   Assessment shows need for Vocational Rehabilitation No   Teandre is retired            Education: Education Goals: Education classes  will be provided on a weekly basis, covering required topics. Participant will state understanding/return demonstration of topics presented.     Core Videos: Exercise    Move It!  Clinical staff conducted group or individual video education with verbal and written material and guidebook.  Patient learns the recommended Pritikin exercise program. Exercise with the goal of living a long, healthy life. Some of the health benefits of exercise include controlled diabetes, healthier blood pressure levels, improved cholesterol levels, improved heart and lung capacity, improved sleep, and better body composition. Everyone should speak with their doctor before starting or changing an exercise routine.  Biomechanical Limitations Clinical staff conducted  group or individual video education with verbal and written material and guidebook.  Patient learns how biomechanical limitations can impact exercise and how we can mitigate and possibly overcome limitations to have an impactful and balanced exercise routine.  Body Composition Clinical staff conducted group or individual video education with verbal and written material and guidebook.  Patient learns that body composition (ratio of muscle mass to fat mass) is a key component to assessing overall fitness, rather than body weight alone. Increased fat mass, especially visceral belly fat, can put [place] at increased risk for metabolic syndrome, type 2 diabetes, heart disease, and even death. It is recommended to combine diet and exercise (cardiovascular and resistance training) to improve your body composition. Seek guidance from your physician and exercise physiologist before implementing an exercise routine.  Exercise Action Plan Clinical staff conducted group or individual video education with verbal and written material and guidebook.  Patient learns the recommended strategies to achieve and enjoy long-term exercise adherence, including variety, self-motivation, self-efficacy, and positive decision making. Benefits of exercise include fitness, good health, weight management, more energy, better sleep, less stress, and overall well-being.  Medical   Heart Disease Risk Reduction Clinical staff conducted group or individual video education with verbal and written material and guidebook.  Patient learns our heart is our most vital organ as it circulates oxygen, nutrients, white blood cells, and hormones throughout the entire body, and carries waste away. Data supports a plant-based eating plan like the Pritikin Program for its effectiveness in slowing progression of and reversing heart disease. The video provides a number of recommendations to address heart disease.   Metabolic Syndrome and Belly Fat   Clinical staff conducted group or individual video education with verbal and written material and guidebook.  Patient learns what metabolic syndrome is, how it leads to heart disease, and how one can reverse it and keep it from coming back. You have metabolic syndrome if you have 3 of the following 5 criteria: abdominal obesity, high blood pressure, high triglycerides, low HDL cholesterol, and high blood sugar.  Hypertension and Heart Disease Clinical staff conducted group or individual video education with verbal and written material and guidebook.  Patient learns that high blood pressure, or hypertension, is very common in the [place]. Hypertension is largely due to excessive salt intake, but other important risk factors include being overweight, physical inactivity, drinking too much alcohol, smoking, and not eating enough potassium from fruits and vegetables. High blood pressure is a leading risk factor for heart attack, stroke, congestive heart failure, dementia, kidney failure, and premature death. Long-term effects of excessive salt intake include stiffening of the arteries and thickening of heart muscle and organ damage. Recommendations include ways to reduce hypertension and the risk of heart disease.  Diseases of Our Time - Focusing on Diabetes Clinical staff conducted group or individual  video education with verbal and written material and guidebook.  Patient learns why the best way to stop diseases of our time is prevention, through food and other lifestyle changes. Medicine (such as prescription pills and surgeries) is often only a Band-Aid on the problem, not a long-term solution. Most common diseases of our time include obesity, type 2 diabetes, hypertension, heart disease, and cancer. The Pritikin Program is recommended and has been proven to help reduce, reverse, and/or prevent the damaging effects of metabolic syndrome.  Nutrition   Overview of the Pritikin Eating Plan   Clinical staff conducted group or individual video education with verbal and written material and guidebook.  Patient learns about the Pritikin Eating Plan for disease risk reduction. The Pritikin Eating Plan emphasizes a wide variety of unrefined, minimally-processed carbohydrates, like fruits, vegetables, whole grains, and legumes. Go, Caution, and Stop food choices are explained. Plant-based and lean animal proteins are emphasized. Rationale provided for low sodium intake for blood pressure control, low added sugars for blood sugar stabilization, and low added fats and oils for coronary artery disease risk reduction and weight management.  Calorie Density  Clinical staff conducted group or individual video education with verbal and written material and guidebook.  Patient learns about calorie density and how it impacts the Pritikin Eating Plan. Knowing the characteristics of the food you choose will help you decide whether those foods will lead to weight gain or weight loss, and whether you want to consume more or less of them. Weight loss is usually a side effect of the Pritikin Eating Plan because of its focus on low calorie-dense foods.  Label Reading  Clinical staff conducted group or individual video education with verbal and written material and guidebook.  Patient learns about the Pritikin recommended label reading guidelines and corresponding recommendations regarding calorie density, added sugars, sodium content, and whole grains.  Dining Out - Part 1  Clinical staff conducted group or individual video education with verbal and written material and guidebook.  Patient learns that restaurant meals can be sabotaging because they can be so high in calories, fat, sodium, and/or sugar. Patient learns recommended strategies on how to positively address this and avoid unhealthy pitfalls.  Facts on Fats  Clinical staff conducted group or individual video education with verbal and written  material and guidebook.  Patient learns that lifestyle modifications can be just as effective, if not more so, as many medications for lowering your risk of heart disease. A Pritikin lifestyle can help to reduce your risk of inflammation and atherosclerosis (cholesterol build-up, or plaque, in the artery walls). Lifestyle interventions such as dietary choices and physical activity address the cause of atherosclerosis. A review of the types of fats and their impact on blood cholesterol levels, along with dietary recommendations to reduce fat intake is also included.  Nutrition Action Plan  Clinical staff conducted group or individual video education with verbal and written material and guidebook.  Patient learns how to incorporate Pritikin recommendations into their lifestyle. Recommendations include planning and keeping personal health goals in mind as an important part of their success.  Healthy Mind-Set    Healthy Minds, Bodies, Hearts  Clinical staff conducted group or individual video education with verbal and written material and guidebook.  Patient learns how to identify when they are stressed. Video will discuss the impact of that stress, as well as the many benefits of stress management. Patient will also be introduced to stress management techniques. The way we think, act, and feel  has an impact on our hearts.  How Our Thoughts Can Heal Our Hearts  Clinical staff conducted group or individual video education with verbal and written material and guidebook.  Patient learns that negative thoughts can cause depression and anxiety. This can result in negative lifestyle behavior and serious health problems. Cognitive behavioral therapy is an effective method to help control our thoughts in order to change and improve our emotional outlook.  Additional Videos:  Exercise    Improving Performance  Clinical staff conducted group or individual video education with verbal and written material and  guidebook.  Patient learns to use a non-linear approach by alternating intensity levels and lengths of time spent exercising to help burn more calories and lose more body fat. Cardiovascular exercise helps improve heart health, metabolism, hormonal balance, blood sugar control, and recovery from fatigue. Resistance training improves strength, endurance, balance, coordination, reaction time, metabolism, and muscle mass. Flexibility exercise improves circulation, posture, and balance. Seek guidance from your physician and exercise physiologist before implementing an exercise routine and learn your capabilities and proper form for all exercise.  Introduction to Yoga  Clinical staff conducted group or individual video education with verbal and written material and guidebook.  Patient learns about yoga, a discipline of the coming together of mind, breath, and body. The benefits of yoga include improved flexibility, improved range of motion, better posture and core strength, increased lung function, weight loss, and positive self-image. Yoga's heart health benefits include lowered blood pressure, healthier heart rate, decreased cholesterol and triglyceride levels, improved immune function, and reduced stress. Seek guidance from your physician and exercise physiologist before implementing an exercise routine and learn your capabilities and proper form for all exercise.  Medical   Aging: Enhancing Your Quality of Life  Clinical staff conducted group or individual video education with verbal and written material and guidebook.  Patient learns key strategies and recommendations to stay in good physical health and enhance quality of life, such as prevention strategies, having an advocate, securing a Health Care Proxy and Power of Attorney, and keeping a list of medications and system for tracking them. It also discusses how to avoid risk for bone loss.  Biology of Weight Control  Clinical staff conducted group or  individual video education with verbal and written material and guidebook.  Patient learns that weight gain occurs because we consume more calories than we burn (eating more, moving less). Even if your body weight is normal, you may have higher ratios of fat compared to muscle mass. Too much body fat puts you at increased risk for cardiovascular disease, heart attack, stroke, type 2 diabetes, and obesity-related cancers. In addition to exercise, following the Pritikin Eating Plan can help reduce your risk.  Decoding Lab Results  Clinical staff conducted group or individual video education with verbal and written material and guidebook.  Patient learns that lab test reflects one measurement whose values change over time and are influenced by many factors, including medication, stress, sleep, exercise, food, hydration, pre-existing medical conditions, and more. It is recommended to use the knowledge from this video to become more involved with your lab results and evaluate your numbers to speak with your doctor.   Diseases of Our Time - Overview  Clinical staff conducted group or individual video education with verbal and written material and guidebook.  Patient learns that according to the CDC, 50% to 70% of chronic diseases (such as obesity, type 2 diabetes, elevated lipids, hypertension, and heart disease) are avoidable through lifestyle  improvements including healthier food choices, listening to satiety cues, and increased physical activity.  Sleep Disorders Clinical staff conducted group or individual video education with verbal and written material and guidebook.  Patient learns how good quality and duration of sleep are important to overall health and well-being. Patient also learns about sleep disorders and how they impact health along with recommendations to address them, including discussing with a physician.  Nutrition  Dining Out - Part 2 Clinical staff conducted group or individual video  education with verbal and written material and guidebook.  Patient learns how to plan ahead and communicate in order to maximize their dining experience in a healthy and nutritious manner. Included are recommended food choices based on the type of restaurant the patient is visiting.   Fueling a Banker conducted group or individual video education with verbal and written material and guidebook.  There is a strong connection between our food choices and our health. Diseases like obesity and type 2 diabetes are very prevalent and are in large-part due to lifestyle choices. The Pritikin Eating Plan provides plenty of food and hunger-curbing satisfaction. It is easy to follow, affordable, and helps reduce health risks.  Menu Workshop  Clinical staff conducted group or individual video education with verbal and written material and guidebook.  Patient learns that restaurant meals can sabotage health goals because they are often packed with calories, fat, sodium, and sugar. Recommendations include strategies to plan ahead and to communicate with the manager, chef, or server to help order a healthier meal.  Planning Your Eating Strategy  Clinical staff conducted group or individual video education with verbal and written material and guidebook.  Patient learns about the Pritikin Eating Plan and its benefit of reducing the risk of disease. The Pritikin Eating Plan does not focus on calories. Instead, it emphasizes high-quality, nutrient-rich foods. By knowing the characteristics of the foods, we choose, we can determine their calorie density and make informed decisions.  Targeting Your Nutrition Priorities  Clinical staff conducted group or individual video education with verbal and written material and guidebook.  Patient learns that lifestyle habits have a tremendous impact on disease risk and progression. This video provides eating and physical activity recommendations based on your  personal health goals, such as reducing LDL cholesterol, losing weight, preventing or controlling type 2 diabetes, and reducing high blood pressure.  Vitamins and Minerals  Clinical staff conducted group or individual video education with verbal and written material and guidebook.  Patient learns different ways to obtain key vitamins and minerals, including through a recommended healthy diet. It is important to discuss all supplements you take with your doctor.   Healthy Mind-Set    Smoking Cessation  Clinical staff conducted group or individual video education with verbal and written material and guidebook.  Patient learns that cigarette smoking and tobacco addiction pose a serious health risk which affects millions of people. Stopping smoking will significantly reduce the risk of heart disease, lung disease, and many forms of cancer. Recommended strategies for quitting are covered, including working with your doctor to develop a successful plan.  Culinary   Becoming a Set designer conducted group or individual video education with verbal and written material and guidebook.  Patient learns that cooking at home can be healthy, cost-effective, quick, and puts them in control. Keys to cooking healthy recipes will include looking at your recipe, assessing your equipment needs, planning ahead, making it simple, choosing cost-effective seasonal ingredients, and  limiting the use of added fats, salts, and sugars.  Cooking - Breakfast and Snacks  Clinical staff conducted group or individual video education with verbal and written material and guidebook.  Patient learns how important breakfast is to satiety and nutrition through the entire day. Recommendations include key foods to eat during breakfast to help stabilize blood sugar levels and to prevent overeating at meals later in the day. Planning ahead is also a key component.  Cooking - Educational psychologist conducted  group or individual video education with verbal and written material and guidebook.  Patient learns eating strategies to improve overall health, including an approach to cook more at home. Recommendations include thinking of animal protein as a side on your plate rather than center stage and focusing instead on lower calorie dense options like vegetables, fruits, whole grains, and plant-based proteins, such as beans. Making sauces in large quantities to freeze for later and leaving the skin on your vegetables are also recommended to maximize your experience.  Cooking - Healthy Salads and Dressing Clinical staff conducted group or individual video education with verbal and written material and guidebook.  Patient learns that vegetables, fruits, whole grains, and legumes are the foundations of the Pritikin Eating Plan. Recommendations include how to incorporate each of these in flavorful and healthy salads, and how to create homemade salad dressings. Proper handling of ingredients is also covered. Cooking - Soups and [employer] - Soups and Desserts Clinical staff conducted group or individual video education with verbal and written material and guidebook.  Patient learns that Pritikin soups and desserts make for easy, nutritious, and delicious snacks and meal components that are low in sodium, fat, sugar, and calorie density, while high in vitamins, minerals, and filling fiber. Recommendations include simple and healthy ideas for soups and desserts.   Overview     The Pritikin Solution Program Overview Clinical staff conducted group or individual video education with verbal and written material and guidebook.  Patient learns that the results of the Pritikin Program have been documented in more than 100 articles published in peer-reviewed journals, and the benefits include reducing risk factors for (and, in some cases, even reversing) high cholesterol, high blood pressure, type 2 diabetes, obesity,  and more! An overview of the three key pillars of the Pritikin Program will be covered: eating well, doing regular exercise, and having a healthy mind-set.  WORKSHOPS  Exercise: Exercise Basics: Building Your Action Plan Clinical staff led group instruction and group discussion with PowerPoint presentation and patient guidebook. To enhance the learning environment the use of posters, models and videos may be added. At the conclusion of this workshop, patients will comprehend the difference between physical activity and exercise, as well as the benefits of incorporating both, into their routine. Patients will understand the FITT (Frequency, Intensity, Time, and Type) principle and how to use it to build an exercise action plan. In addition, safety concerns and other considerations for exercise and cardiac rehab will be addressed by the presenter. The purpose of this lesson is to promote a comprehensive and effective weekly exercise routine in order to improve patients' overall level of fitness.   Managing Heart Disease: Your Path to a Healthier Heart Clinical staff led group instruction and group discussion with PowerPoint presentation and patient guidebook. To enhance the learning environment the use of posters, models and videos may be added.At the conclusion of this workshop, patients will understand the anatomy and physiology of the heart. Additionally, they  will understand how Pritikin's three pillars impact the risk factors, the progression, and the management of heart disease.  The purpose of this lesson is to provide a high-level overview of the heart, heart disease, and how the Pritikin lifestyle positively impacts risk factors.  Exercise Biomechanics Clinical staff led group instruction and group discussion with PowerPoint presentation and patient guidebook. To enhance the learning environment the use of posters, models and videos may be added. Patients will learn how the structural  parts of their bodies function and how these functions impact their daily activities, movement, and exercise. Patients will learn how to promote a neutral spine, learn how to manage pain, and identify ways to improve their physical movement in order to promote healthy living. The purpose of this lesson is to expose patients to common physical limitations that impact physical activity. Participants will learn practical ways to adapt and manage aches and pains, and to minimize their effect on regular exercise. Patients will learn how to maintain good posture while sitting, walking, and lifting.  Balance Training and Fall Prevention  Clinical staff led group instruction and group discussion with PowerPoint presentation and patient guidebook. To enhance the learning environment the use of posters, models and videos may be added. At the conclusion of this workshop, patients will understand the importance of their sensorimotor skills (vision, proprioception, and the vestibular system) in maintaining their ability to balance as they age. Patients will apply a variety of balancing exercises that are appropriate for their current level of function. Patients will understand the common causes for poor balance, possible solutions to these problems, and ways to modify their physical environment in order to minimize their fall risk. The purpose of this lesson is to teach patients about the importance of maintaining balance as they age and ways to minimize their risk of falling.  WORKSHOPS   Nutrition:  Fueling a Ship broker led group instruction and group discussion with PowerPoint presentation and patient guidebook. To enhance the learning environment the use of posters, models and videos may be added. Patients will review the foundational principles of the Pritikin Eating Plan and understand what constitutes a serving size in each of the food groups. Patients will also learn Pritikin-friendly  foods that are better choices when away from home and review make-ahead meal and snack options. Calorie density will be reviewed and applied to three nutrition priorities: weight maintenance, weight loss, and weight gain. The purpose of this lesson is to reinforce (in a group setting) the key concepts around what patients are recommended to eat and how to apply these guidelines when away from home by planning and selecting Pritikin-friendly options. Patients will understand how calorie density may be adjusted for different weight management goals.  Mindful Eating  Clinical staff led group instruction and group discussion with PowerPoint presentation and patient guidebook. To enhance the learning environment the use of posters, models and videos may be added. Patients will briefly review the concepts of the Pritikin Eating Plan and the importance of low-calorie dense foods. The concept of mindful eating will be introduced as well as the importance of paying attention to internal hunger signals. Triggers for non-hunger eating and techniques for dealing with triggers will be explored. The purpose of this lesson is to provide patients with the opportunity to review the basic principles of the Pritikin Eating Plan, discuss the value of eating mindfully and how to measure internal cues of hunger and fullness using the Hunger Scale. Patients will also discuss reasons  for non-hunger eating and learn strategies to use for controlling emotional eating.  Targeting Your Nutrition Priorities Clinical staff led group instruction and group discussion with PowerPoint presentation and patient guidebook. To enhance the learning environment the use of posters, models and videos may be added. Patients will learn how to determine their genetic susceptibility to disease by reviewing their family history. Patients will gain insight into the importance of diet as part of an overall healthy lifestyle in mitigating the impact of  genetics and other environmental insults. The purpose of this lesson is to provide patients with the opportunity to assess their personal nutrition priorities by looking at their family history, their own health history and current risk factors. Patients will also be able to discuss ways of prioritizing and modifying the Pritikin Eating Plan for their highest risk areas  Menu  Clinical staff led group instruction and group discussion with PowerPoint presentation and patient guidebook. To enhance the learning environment the use of posters, models and videos may be added. Using menus brought in from [employer], or printed from [employer], patients will apply the Pritikin dining out guidelines that were presented in the [employer] video. Patients will also be able to practice these guidelines in a variety of provided scenarios. The purpose of this lesson is to provide patients with the opportunity to practice hands-on learning of the Pritikin Dining Out guidelines with actual menus and practice scenarios.  Label Reading Clinical staff led group instruction and group discussion with PowerPoint presentation and patient guidebook. To enhance the learning environment the use of posters, models and videos may be added. Patients will review and discuss the Pritikin label reading guidelines presented in Pritikin's Label Reading Educational series video. Using fool labels brought in from local grocery stores and markets, patients will apply the label reading guidelines and determine if the packaged food meet the Pritikin guidelines. The purpose of this lesson is to provide patients with the opportunity to review, discuss, and practice hands-on learning of the Pritikin Label Reading guidelines with actual packaged food labels. Cooking School  Pritikin's [employer] are designed to teach patients ways to prepare quick, simple, and affordable recipes at home. The importance of  nutrition's role in chronic disease risk reduction is reflected in its emphasis in the overall Pritikin program. By learning how to prepare essential core Pritikin Eating Plan recipes, patients will increase control over what they eat; be able to customize the flavor of foods without the use of added salt, sugar, or fat; and improve the quality of the food they consume. By learning a set of core recipes which are easily assembled, quickly prepared, and affordable, patients are more likely to prepare more healthy foods at home. These workshops focus on convenient breakfasts, simple entres, side dishes, and desserts which can be prepared with minimal effort and are consistent with nutrition recommendations for cardiovascular risk reduction. Cooking [employer] are taught by a Armed forces logistics/support/administrative officer (RD) who has been trained by the [employer]. The chef or RD has a clear understanding of the importance of minimizing - if not completely eliminating - added fat, sugar, and sodium in recipes. Throughout the series of Cooking School Workshop sessions, patients will learn about healthy ingredients and efficient methods of cooking to build confidence in their capability to prepare    Cooking School weekly topics:  Adding Flavor- Sodium-Free  Fast and Healthy Breakfasts  Powerhouse Plant-Based Proteins  Satisfying Salads and Dressings  Simple  Sides and Sauces  International Cuisine-Spotlight on the [employer] Zones  Delicious Desserts  Savory Soups  [employer] - Meals in a Astronomer Appetizers and Snacks  Comforting Weekend Breakfasts  One-Pot Wonders   Fast Evening Meals  Landscape architect Your Pritikin Plate  WORKSHOPS   Healthy Mindset (Psychosocial):  Focused Goals, Sustainable Changes Clinical staff led group instruction and group discussion with PowerPoint presentation and patient guidebook. To enhance the learning environment the use of posters,  models and videos may be added. Patients will be able to apply effective goal setting strategies to establish at least one personal goal, and then take consistent, meaningful action toward that goal. They will learn to identify common barriers to achieving personal goals and develop strategies to overcome them. Patients will also gain an understanding of how our mind-set can impact our ability to achieve goals and the importance of cultivating a positive and growth-oriented mind-set. The purpose of this lesson is to provide patients with a deeper understanding of how to set and achieve personal goals, as well as the tools and strategies needed to overcome common obstacles which may arise along the way.  From Head to Heart: The Power of a Healthy Outlook  Clinical staff led group instruction and group discussion with PowerPoint presentation and patient guidebook. To enhance the learning environment the use of posters, models and videos may be added. Patients will be able to recognize and describe the impact of emotions and mood on physical health. They will discover the importance of self-care and explore self-care practices which may work for them. Patients will also learn how to utilize the 4 C's to cultivate a healthier outlook and better manage stress and challenges. The purpose of this lesson is to demonstrate to patients how a healthy outlook is an essential part of maintaining good health, especially as they continue their cardiac rehab journey.  Healthy Sleep for a Healthy Heart Clinical staff led group instruction and group discussion with PowerPoint presentation and patient guidebook. To enhance the learning environment the use of posters, models and videos may be added. At the conclusion of this workshop, patients will be able to demonstrate knowledge of the importance of sleep to overall health, well-being, and quality of life. They will understand the symptoms of, and treatments for, common sleep  disorders. Patients will also be able to identify daytime and nighttime behaviors which impact sleep, and they will be able to apply these tools to help manage sleep-related challenges. The purpose of this lesson is to provide patients with a general overview of sleep and outline the importance of quality sleep. Patients will learn about a few of the most common sleep disorders. Patients will also be introduced to the concept of "sleep hygiene," and discover ways to self-manage certain sleeping problems through simple daily behavior changes. Finally, the workshop will motivate patients by clarifying the links between quality sleep and their goals of heart-healthy living.   Recognizing and Reducing Stress Clinical staff led group instruction and group discussion with PowerPoint presentation and patient guidebook. To enhance the learning environment the use of posters, models and videos may be added. At the conclusion of this workshop, patients will be able to understand the types of stress reactions, differentiate between acute and chronic stress, and recognize the impact that chronic stress has on their health. They will also be able to apply different coping mechanisms, such as reframing negative self-talk. Patients will have the opportunity to practice a variety of stress  management techniques, such as deep abdominal breathing, progressive muscle relaxation, and/or guided imagery.  The purpose of this lesson is to educate patients on the role of stress in their lives and to provide healthy techniques for coping with it.  Learning Barriers/Preferences:  Learning Barriers/Preferences - 09/03/23 1119       Learning Barriers/Preferences   Learning Barriers Sight   wears glasses   Learning Preferences Audio;Group Instruction;Individual Instruction;Pictoral;Skilled Demonstration;Verbal Instruction;Written Material;Computer/Internet;Video             Education Topics:  Knowledge Questionnaire  Score:  Knowledge Questionnaire Score - 09/03/23 1120       Knowledge Questionnaire Score   Pre Score 21/24             Core Components/Risk Factors/Patient Goals at Admission:  Personal Goals and Risk Factors at Admission - 09/03/23 1120       Core Components/Risk Factors/Patient Goals on Admission    Weight Management Yes;Obesity;Weight Loss    Intervention Weight Management: Develop a combined nutrition and exercise program designed to reach desired caloric intake, while maintaining appropriate intake of nutrient and fiber, sodium and fats, and appropriate energy expenditure required for the weight goal.;Weight Management/Obesity: Establish reasonable short term and long term weight goals.;Weight Management: Provide education and appropriate resources to help participant work on and attain dietary goals.;Obesity: Provide education and appropriate resources to help participant work on and attain dietary goals.    Expected Outcomes Short Term: Continue to assess and modify interventions until short term weight is achieved;Long Term: Adherence to nutrition and physical activity/exercise program aimed toward attainment of established weight goal;Understanding recommendations for meals to include 15-35% energy as protein, 25-35% energy from fat, 35-60% energy from carbohydrates, less than 200mg  of dietary cholesterol, 20-35 gm of total fiber daily;Weight Loss: Understanding of general recommendations for a balanced deficit meal plan, which promotes 1-2 lb weight loss per week and includes a negative energy balance of [phone] kcal/d;Understanding of distribution of calorie intake throughout the day with the consumption of 4-5 meals/snacks    Diabetes Yes    Intervention Provide education about signs/symptoms and action to take for hypo/hyperglycemia.;Provide education about proper nutrition, including hydration, and aerobic/resistive exercise prescription along with prescribed medications to  achieve blood glucose in normal ranges: Fasting glucose 65-99 mg/dL    Expected Outcomes Short Term: Participant verbalizes understanding of the signs/symptoms and immediate care of hyper/hypoglycemia, proper foot care and importance of medication, aerobic/resistive exercise and nutrition plan for blood glucose control.;Long Term: Attainment of HbA1C < 7%.    Heart Failure Yes    Intervention Provide a combined exercise and nutrition program that is supplemented with education, support and counseling about heart failure. Directed toward relieving symptoms such as shortness of breath, decreased exercise tolerance, and extremity edema.    Expected Outcomes Long term: Adoption of self-care skills and reduction of barriers for early signs and symptoms recognition and intervention leading to self-care maintenance.;Short term: Daily weights obtained and reported for increase. Utilizing diuretic protocols set by physician.;Short term: Attendance in program 2-3 days a week with increased exercise capacity. Reported lower sodium intake. Reported increased fruit and vegetable intake. Reports medication compliance.;Improve functional capacity of life    Hypertension Yes    Intervention Provide education on lifestyle modifcations including regular physical activity/exercise, weight management, moderate sodium restriction and increased consumption of fresh fruit, vegetables, and low fat dairy, alcohol moderation, and smoking cessation.;Monitor prescription use compliance.    Expected Outcomes Short Term: Continued assessment and intervention until BP  is < 140/32mm HG in hypertensive participants. < 130/64mm HG in hypertensive participants with diabetes, heart failure or chronic kidney disease.;Long Term: Maintenance of blood pressure at goal levels.    Stress Yes    Intervention Offer individual and/or small group education and counseling on adjustment to heart disease, stress management and health-related lifestyle  change. Teach and support self-help strategies.;Refer participants experiencing significant psychosocial distress to appropriate mental health specialists for further evaluation and treatment. When possible, include family members and significant others in education/counseling sessions.    Expected Outcomes Short Term: Participant demonstrates changes in health-related behavior, relaxation and other stress management skills, ability to obtain effective social support, and compliance with psychotropic medications if prescribed.;Long Term: Emotional wellbeing is indicated by absence of clinically significant psychosocial distress or social isolation.             Core Components/Risk Factors/Patient Goals Review:   Goals and Risk Factor Review     Row Name 09/11/23 0910 09/18/23 1339 10/15/23 1336 11/08/23 1522 12/11/23 0853     Core Components/Risk Factors/Patient Goals Review   Personal Goals Review Weight Management/Obesity;Lipids;Stress;Heart Failure;Diabetes;Hypertension Weight Management/Obesity;Lipids;Stress;Heart Failure;Diabetes;Hypertension Weight Management/Obesity;Lipids;Stress;Heart Failure;Diabetes;Hypertension Weight Management/Obesity;Lipids;Stress;Heart Failure;Diabetes;Hypertension Weight Management/Obesity;Lipids;Stress;Heart Failure;Diabetes;Hypertension   Review Dangelo did fair with exercise for his fitness level. vital signs and CBG's were stable. Trayton did exceed his target heart rate when walking the track and was switched to the nustep. Abdel is deconditioned. Cullen has a follow an appointment with Dr Elwyn Lade on 09/11/23. Gaylen did fair with exercise for his fitness level. vital signs have bbenstable.  patient is on Midodrine. Yaser did receive a  target heart rate increase per Dr Elwyn Lade. Klever remains deconditioned.  Will continue to monitor. Nalin returned to exercise at cardiac rehab per his PCP. vital signs and CBG's were stable today. Gemini is doing well with exercise at  cardiac rehab  vital signs and CBG's have been stable. Evander says he feels stronger since he has been participating in cardiac rehab. Viren has lost 1.4 kg since starting  cardiac rehab. Kory is doing well with exercise at cardiac rehab  vital signs and CBG's have been stable. Lonzell says he feels stronger since he has been participating in cardiac rehab. Eldwin will complete cardiac rehab on 12/31/23.   Expected Outcomes Aviv will continue to participate in cardiac rehab for exercise, nutrition and lifestyle modifications. Brenn will continue to participate in cardiac rehab for exercise, nutrition and lifestyle modifications. Yorel will continue to participate in cardiac rehab for exercise, nutrition and lifestyle modifications. Macarthur will continue to participate in cardiac rehab for exercise, nutrition and lifestyle modifications. Tyshun will continue to participate in cardiac rehab for exercise, nutrition and lifestyle modifications.            Core Components/Risk Factors/Patient Goals at Discharge (Final Review):   Goals and Risk Factor Review - 12/11/23 0853       Core Components/Risk Factors/Patient Goals Review   Personal Goals Review Weight Management/Obesity;Lipids;Stress;Heart Failure;Diabetes;Hypertension    Review Sahir is doing well with exercise at cardiac rehab  vital signs and CBG's have been stable. Kristain says he feels stronger since he has been participating in cardiac rehab. Davied will complete cardiac rehab on 12/31/23.    Expected Outcomes Cashis will continue to participate in cardiac rehab for exercise, nutrition and lifestyle modifications.             ITP Comments:  ITP Comments     Row Name 09/03/23 1115 09/11/23 0902 09/18/23 1330 10/15/23 1330  11/08/23 1518   ITP Comments Dr. Armanda Magic medical director. Introduction to pritikin education/intensive cardiac rehab. Initial orientation packet reviewed with patient. 30 Day ITP Review. Demarr started cardiac rehab  on 09/10/23. Amaurie did well with exercise for his fitness level as Shooter is somewhat decondtioned. 30 Day ITP Review. Keagan is off to a good start to  exercise for his fitness level 30 Day ITP Review. Dior returned to exercise at cardiac rehab after being absent due to a fall. Nicandro has been cleared to return to exercise per his PCP 30 Day ITP Review. Jarmel has good attendnace and participation with exercise at cardiac rehab    Row Name 12/11/23 0850           ITP Comments 30 Day ITP Review. Kainan has good attendnace and participation with exercise at cardiac rehab. Thinh will tenatively complete cardiac rehab on 12/31/23.                Comments: See ITP Comments

## 2023-12-12 ENCOUNTER — Encounter (HOSPITAL_COMMUNITY)
Admission: RE | Admit: 2023-12-12 | Discharge: 2023-12-12 | Disposition: A | Discharge status: Home / Self Care | Payer: Medicare Other | Source: Ambulatory Visit | Attending: Cardiovascular Disease | Admitting: Cardiovascular Disease

## 2023-12-12 DIAGNOSIS — I5022 Chronic systolic (congestive) heart failure: Secondary | ICD-10-CM | POA: Diagnosis not present

## 2023-12-14 ENCOUNTER — Ambulatory Visit: Payer: Medicare Other | Admitting: Cardiovascular Disease

## 2023-12-17 ENCOUNTER — Encounter (HOSPITAL_COMMUNITY)
Admission: RE | Admit: 2023-12-17 | Discharge: 2023-12-17 | Disposition: A | Discharge status: Home / Self Care | Payer: Medicare Other | Source: Ambulatory Visit | Attending: Cardiovascular Disease | Admitting: Cardiovascular Disease

## 2023-12-17 DIAGNOSIS — I5022 Chronic systolic (congestive) heart failure: Secondary | ICD-10-CM

## 2023-12-19 ENCOUNTER — Encounter (HOSPITAL_COMMUNITY)
Admission: RE | Admit: 2023-12-19 | Discharge: 2023-12-19 | Disposition: A | Discharge status: Home / Self Care | Payer: Medicare Other | Source: Ambulatory Visit | Attending: Cardiovascular Disease | Admitting: Cardiovascular Disease

## 2023-12-19 DIAGNOSIS — I5022 Chronic systolic (congestive) heart failure: Secondary | ICD-10-CM

## 2023-12-24 ENCOUNTER — Encounter (HOSPITAL_COMMUNITY)
Admission: RE | Admit: 2023-12-24 | Discharge: 2023-12-24 | Disposition: A | Discharge status: Home / Self Care | Payer: Medicare Other | Source: Ambulatory Visit | Attending: Cardiovascular Disease | Admitting: Cardiovascular Disease

## 2023-12-24 DIAGNOSIS — I5022 Chronic systolic (congestive) heart failure: Secondary | ICD-10-CM

## 2023-12-26 ENCOUNTER — Encounter (HOSPITAL_COMMUNITY)
Admission: RE | Admit: 2023-12-26 | Discharge: 2023-12-26 | Disposition: A | Discharge status: Home / Self Care | Payer: Medicare Other | Source: Ambulatory Visit | Attending: Cardiovascular Disease | Admitting: Cardiovascular Disease

## 2023-12-26 VITALS — BP 104/70 | HR 77 | Ht 71.0 in | Wt 231.5 lb

## 2023-12-26 DIAGNOSIS — I5022 Chronic systolic (congestive) heart failure: Secondary | ICD-10-CM

## 2023-12-26 NOTE — Progress Notes (Signed)
 Discharge Progress Report  Patient Details  Name: Amber Guthridge MRN: [id number] Date of Birth: [birth year] Referring Provider:   Flowsheet Row INTENSIVE CARDIAC REHAB ORIENT from 09/03/2023 in [facility]  Referring Provider Kristeen Miss, MD        Number of Visits: 27  Reason for Discharge:  Patient reached a stable level of exercise. Patient independent in their exercise. Patient has met program and personal goals.  Smoking History:  Social History   Tobacco Use  Smoking Status Never   Passive exposure: Never  Smokeless Tobacco Never    Diagnosis:  Heart failure, chronic systolic (HCC)  ADL UCSD:   Initial Exercise Prescription:  Initial Exercise Prescription - 09/03/23 1300       Date of Initial Exercise RX and Referring Provider   Date 09/03/23    Referring Provider Kristeen Miss, MD    Expected Discharge Date 11/21/23      Recumbant Bike   Level 1    RPM 50    Watts 35    Minutes 15    METs 2      Track   Laps 13    Minutes 15    METs 2      Prescription Details   Frequency (times per week) 3    Duration Progress to 30 minutes of continuous aerobic without signs/symptoms of physical distress      Intensity   THRR 40-80% of Max Heartrate 57-114    Ratings of Perceived Exertion 11-13    Perceived Dyspnea 0-4      Progression   Progression Continue progressive overload as per policy without signs/symptoms or physical distress.      Resistance Training   Training Prescription Yes    Weight 3    Reps 10-15             Discharge Exercise Prescription (Final Exercise Prescription Changes):  Exercise Prescription Changes - 12/26/23 1022       Response to Exercise   Blood Pressure (Admit) 104/70    Blood Pressure (Exit) 98/60    Heart Rate (Admit) 77 bpm    Heart Rate (Exercise) 122 bpm    Heart Rate (Exit) 69 bpm    Rating of Perceived Exertion (Exercise) 13    Symptoms  None    Comments Jakyrie completed the cardiac rehab program today.    Duration Progress to 30 minutes of  aerobic without signs/symptoms of physical distress    Intensity THRR unchanged   Up to 130 bpm.     Progression   Progression Continue to progress workloads to maintain intensity without signs/symptoms of physical distress.    Average METs 2.9      Resistance Training   Training Prescription No    Weight Relaxation day, no weights.    Reps 10-15    Time 10 Minutes      Interval Training   Interval Training No      NuStep   Level 5   15 minutes @ level 3, 10 minutes @ level, 5 minutes @ level 5.   SPM 100    Minutes 30    METs 2.9      Home Exercise Plan   Plans to continue exercise at Home (comment)   Walking   Frequency Add 3 additional days to program exercise sessions.    Initial Home Exercises Provided 11/07/23             Functional  Capacity:  6 Minute Walk     Row Name 09/03/23 1310 12/19/23 1037       6 Minute Walk   Phase Initial Discharge    Distance 600 feet 1354 feet    Distance % Change -- 125.67 %    Distance Feet Change -- 754 ft    Walk Time 6 minutes 6 minutes    # of Rest Breaks 3  1:30-2:45, 3:15-4:00, 5:15-6:00 due to over THRR 0    MPH 1.14 2.56    METS 1.07 2.32    RPE 8 11    Perceived Dyspnea  0 0.5    VO2 Peak 3.73 8.13    Symptoms Yes (comment) Yes (comment)    Comments 3 rest breaks due to exceeding THRR, no pain Mild shortness of breath    Resting HR 85 bpm 94 bpm    Resting BP 96/58 102/64    Resting Oxygen Saturation  95 % --    Exercise Oxygen Saturation  during 6 min walk 96 % --    Max Ex. HR 119 bpm 115 bpm    Max Ex. BP 117/60 120/62    2 Minute Post BP 113/75 92/70             Psychological, QOL, Others - Outcomes: PHQ 2/9:    12/26/2023   10:03 AM 09/03/2023   11:30 AM  Depression screen PHQ 2/9  Decreased Interest 0 1  Down, Depressed, Hopeless 0 1  PHQ - 2 Score 0 2  Altered sleeping 0 1  Tired,  decreased energy 0 3  Change in appetite 0 0  Feeling bad or failure about yourself  0 0  Trouble concentrating 0 0  Moving slowly or fidgety/restless 0 0  Suicidal thoughts 0 0  PHQ-9 Score 0 6  Difficult doing work/chores  Not difficult at all    Quality of Life:  Quality of Life - 12/26/23 1018       Quality of Life   Select Quality of Life      Quality of Life Scores   Health/Function Pre 20.07 %    Health/Function Post 21.37 %    Health/Function % Change 6.48 %    Socioeconomic Pre 24.83 %    Socioeconomic Post 29.5 %    Socioeconomic % Change  18.81 %    Psych/Spiritual Pre 21.86 %    Psych/Spiritual Post 23.79 %    Psych/Spiritual % Change 8.83 %    Family Pre 30 %    Family Post 30 %    Family % Change 0 %    GLOBAL Pre 22.82 %    GLOBAL Post 24.52 %    GLOBAL % Change 7.45 %             Personal Goals: Goals established at orientation with interventions provided to work toward goal.  Personal Goals and Risk Factors at Admission - 09/03/23 1120       Core Components/Risk Factors/Patient Goals on Admission    Weight Management Yes;Obesity;Weight Loss    Intervention Weight Management: Develop a combined nutrition and exercise program designed to reach desired caloric intake, while maintaining appropriate intake of nutrient and fiber, sodium and fats, and appropriate energy expenditure required for the weight goal.;Weight Management/Obesity: Establish reasonable short term and long term weight goals.;Weight Management: Provide education and appropriate resources to help participant work on and attain dietary goals.;Obesity: Provide education and appropriate resources to help participant work on and attain dietary goals.  Expected Outcomes Short Term: Continue to assess and modify interventions until short term weight is achieved;Long Term: Adherence to nutrition and physical activity/exercise program aimed toward attainment of established weight  goal;Understanding recommendations for meals to include 15-35% energy as protein, 25-35% energy from fat, 35-60% energy from carbohydrates, less than 200mg  of dietary cholesterol, 20-35 gm of total fiber daily;Weight Loss: Understanding of general recommendations for a balanced deficit meal plan, which promotes 1-2 lb weight loss per week and includes a negative energy balance of [phone] kcal/d;Understanding of distribution of calorie intake throughout the day with the consumption of 4-5 meals/snacks    Diabetes Yes    Intervention Provide education about signs/symptoms and action to take for hypo/hyperglycemia.;Provide education about proper nutrition, including hydration, and aerobic/resistive exercise prescription along with prescribed medications to achieve blood glucose in normal ranges: Fasting glucose 65-99 mg/dL    Expected Outcomes Short Term: Participant verbalizes understanding of the signs/symptoms and immediate care of hyper/hypoglycemia, proper foot care and importance of medication, aerobic/resistive exercise and nutrition plan for blood glucose control.;Long Term: Attainment of HbA1C < 7%.    Heart Failure Yes    Intervention Provide a combined exercise and nutrition program that is supplemented with education, support and counseling about heart failure. Directed toward relieving symptoms such as shortness of breath, decreased exercise tolerance, and extremity edema.    Expected Outcomes Long term: Adoption of self-care skills and reduction of barriers for early signs and symptoms recognition and intervention leading to self-care maintenance.;Short term: Daily weights obtained and reported for increase. Utilizing diuretic protocols set by physician.;Short term: Attendance in program 2-3 days a week with increased exercise capacity. Reported lower sodium intake. Reported increased fruit and vegetable intake. Reports medication compliance.;Improve functional capacity of life    Hypertension Yes     Intervention Provide education on lifestyle modifcations including regular physical activity/exercise, weight management, moderate sodium restriction and increased consumption of fresh fruit, vegetables, and low fat dairy, alcohol moderation, and smoking cessation.;Monitor prescription use compliance.    Expected Outcomes Short Term: Continued assessment and intervention until BP is < 140/49mm HG in hypertensive participants. < 130/16mm HG in hypertensive participants with diabetes, heart failure or chronic kidney disease.;Long Term: Maintenance of blood pressure at goal levels.    Stress Yes    Intervention Offer individual and/or small group education and counseling on adjustment to heart disease, stress management and health-related lifestyle change. Teach and support self-help strategies.;Refer participants experiencing significant psychosocial distress to appropriate mental health specialists for further evaluation and treatment. When possible, include family members and significant others in education/counseling sessions.    Expected Outcomes Short Term: Participant demonstrates changes in health-related behavior, relaxation and other stress management skills, ability to obtain effective social support, and compliance with psychotropic medications if prescribed.;Long Term: Emotional wellbeing is indicated by absence of clinically significant psychosocial distress or social isolation.              Personal Goals Discharge:  Goals and Risk Factor Review     Row Name 09/11/23 0910 09/18/23 1339 10/15/23 1336 11/08/23 1522 12/11/23 0853     Core Components/Risk Factors/Patient Goals Review   Personal Goals Review Weight Management/Obesity;Lipids;Stress;Heart Failure;Diabetes;Hypertension Weight Management/Obesity;Lipids;Stress;Heart Failure;Diabetes;Hypertension Weight Management/Obesity;Lipids;Stress;Heart Failure;Diabetes;Hypertension Weight Management/Obesity;Lipids;Stress;Heart  Failure;Diabetes;Hypertension Weight Management/Obesity;Lipids;Stress;Heart Failure;Diabetes;Hypertension   Review Taytum did fair with exercise for his fitness level. vital signs and CBG's were stable. Killian did exceed his target heart rate when walking the track and was switched to the nustep. Siegfried is deconditioned. Veron has a  follow an appointment with Dr Elwyn Lade on 09/11/23. Auden did fair with exercise for his fitness level. vital signs have bbenstable.  patient is on Midodrine. Jp did receive a  target heart rate increase per Dr Elwyn Lade. Zacariah remains deconditioned.  Will continue to monitor. Hawk returned to exercise at cardiac rehab per his PCP. vital signs and CBG's were stable today. Crecencio is doing well with exercise at cardiac rehab  vital signs and CBG's have been stable. Jayanth says he feels stronger since he has been participating in cardiac rehab. Stephon has lost 1.4 kg since starting  cardiac rehab. Aundray is doing well with exercise at cardiac rehab  vital signs and CBG's have been stable. Nason says he feels stronger since he has been participating in cardiac rehab. Nijee will complete cardiac rehab on 12/31/23.   Expected Outcomes Cederick will continue to participate in cardiac rehab for exercise, nutrition and lifestyle modifications. Edmund will continue to participate in cardiac rehab for exercise, nutrition and lifestyle modifications. Sreekar will continue to participate in cardiac rehab for exercise, nutrition and lifestyle modifications. Zaydyn will continue to participate in cardiac rehab for exercise, nutrition and lifestyle modifications. Whitley will continue to participate in cardiac rehab for exercise, nutrition and lifestyle modifications.            Exercise Goals and Review:  Exercise Goals     Row Name 09/03/23 1118             Exercise Goals   Increase Physical Activity Yes       Intervention Provide advice, education, support and counseling about physical  activity/exercise needs.;Develop an individualized exercise prescription for aerobic and resistive training based on initial evaluation findings, risk stratification, comorbidities and participant's personal goals.       Expected Outcomes Short Term: Attend rehab on a regular basis to increase amount of physical activity.;Long Term: Exercising regularly at least 3-5 days a week.;Long Term: Add in home exercise to make exercise part of routine and to increase amount of physical activity.       Increase Strength and Stamina Yes       Intervention Provide advice, education, support and counseling about physical activity/exercise needs.;Develop an individualized exercise prescription for aerobic and resistive training based on initial evaluation findings, risk stratification, comorbidities and participant's personal goals.       Expected Outcomes Short Term: Perform resistance training exercises routinely during rehab and add in resistance training at home;Long Term: Improve cardiorespiratory fitness, muscular endurance and strength as measured by increased METs and functional capacity ( );Short Term: Increase workloads from initial exercise prescription for resistance, speed, and METs.       Able to understand and use rate of perceived exertion (RPE) scale Yes       Intervention Provide education and explanation on how to use RPE scale       Expected Outcomes Short Term: Able to use RPE daily in rehab to express subjective intensity level;Long Term:  Able to use RPE to guide intensity level when exercising independently       Knowledge and understanding of Target Heart Rate Range (THRR) Yes       Intervention Provide education and explanation of THRR including how the numbers were predicted and where they are located for reference       Expected Outcomes Short Term: Able to state/look up THRR;Short Term: Able to use daily as guideline for intensity in rehab;Long Term: Able to use THRR to govern intensity  when exercising independently  Understanding of Exercise Prescription Yes       Intervention Provide education, explanation, and written materials on patient's individual exercise prescription       Expected Outcomes Short Term: Able to explain program exercise prescription;Long Term: Able to explain home exercise prescription to exercise independently                Exercise Goals Re-Evaluation:  Exercise Goals Re-Evaluation     Row Name 09/10/23 1136 09/12/23 1333 09/17/23 1028 10/15/23 1123 10/17/23 1109     Exercise Goal Re-Evaluation   Exercise Goals Review Increase Physical Activity;Increase Strength and Stamina;Able to understand and use rate of perceived exertion (RPE) scale Increase Physical Activity;Increase Strength and Stamina;Able to understand and use rate of perceived exertion (RPE) scale Increase Physical Activity;Increase Strength and Stamina;Able to understand and use rate of perceived exertion (RPE) scale Increase Physical Activity;Increase Strength and Stamina;Able to understand and use rate of perceived exertion (RPE) scale Increase Physical Activity;Increase Strength and Stamina;Able to understand and use rate of perceived exertion (RPE) scale   Comments Alder was able to understand and use RPE scale appropriately. Received clearance to increase THRR  up to 130s. Patient had been on the NuStep for 30 minutes due to elevated heart rate with walking. Received clearance from his cardiologist to increase THRR and resumed walking the track today. He tolerated fair, stopping early due to fatigue. Maclovio returned to exercise today after being out due to a fall at home on 09/17/23. Tolerated exercise well without symptoms. Norwin does feel like he's progressing yet with exercise. He expressed to me that the dynamic aerobic warm-up and stretches that we do tires him and decreases what he's able to do on the NuStep machine. He would like to walk to warm-up instead of the current  warm-up routine, so he has more energy for the NuStep. Will make this change to his exercise routine at his next session on Monday. He is walking 15 minutes, 2-3 day/week at home, but his walking is limited by back pain from spinal stenosis. He states he will be able to increase his walking duration as he gets stronger.   Expected Outcomes Increase duration to achieve 30 minutes. Progress workloads as tolerated to help increase cardiorespiratory fitness. Will progress workloads to keep heart rate within limits. Will continue to progress as tolerated. Progress workloads gradually as tolerated. Jaishawn will walk the track to warm-up prior to his first station. As he gets stronger, he will increase his walking duration at home.    Row Name 11/07/23 1105 12/05/23 1137 12/19/23 1050 12/26/23 1117       Exercise Goal Re-Evaluation   Exercise Goals Review Increase Physical Activity;Increase Strength and Stamina;Able to understand and use rate of perceived exertion (RPE) scale;Understanding of Exercise Prescription Increase Physical Activity;Increase Strength and Stamina;Able to understand and use rate of perceived exertion (RPE) scale;Understanding of Exercise Prescription Increase Physical Activity;Increase Strength and Stamina;Able to understand and use rate of perceived exertion (RPE) scale;Understanding of Exercise Prescription Increase Physical Activity;Increase Strength and Stamina;Able to understand and use rate of perceived exertion (RPE) scale;Understanding of Exercise Prescription    Comments Dezmen still feels like exercise in the program is helping him. He says using the NuStep machine, his legs feel stronger. He says exercising here has given him confidence that he can exercise without having a cardiac issue. Reviewed exercise prescription with him, and he continues to walk 10-15 minutes 2-3 days/week. He has increased his level on the NuStep. He was exercising  at level 2 for 25 minutes and level 3 for 5  minutes. Today he exercised at level 2 for 10 minutes and level 3 for 20 minutes. Parrish continues to progress well with exercise. On the NuStep, he's up to level 3 for 25 minutes, level 4 for 5 minutes. His goal is to achieve 30 minutes at level 5. He feels his legs are stronger now. He walks at home in addition to exercise at cardiac rehab. He wants to continue exercise after graduation. Will look into community options. Cordell will complete the cardiac rehab program next Wednesday and has progressed well. He was able to walk 754 further on his walk test without rest breaks for elevated heart rate. His strength increased 13% as measured by grip strength. He plans to continue walking at least 10-15 minutes daily or ride his recumbent daily. His walking is limited by hip pain. Nezar completed cardiac rehab today and progressed well, achieving 2.9 METs with exercise. He was able to increase his level to 5 for 5 minutes on the NuStep. He will try to walk at least 20 minutes and/or ride his recumbent bike at least 5 days/week. His goal is to exercise daily.    Expected Outcomes Henery will continue walking at home in addition to progressing workloads at cardiac rehab. Continue current exercise progression. Laster will conitnue daily exercise to maintain health and fitness gains. Caster will conitnue his home exercise routine to maintain health and fitness gains.             Nutrition & Weight - Outcomes:  Pre Biometrics - 09/03/23 1121       Pre Biometrics   Waist Circumference 48 inches    Hip Circumference 53 inches    Waist to Hip Ratio 0.91 %    Triceps Skinfold 27 mm    % Body Fat 35.7 %    Grip Strength 30 kg    Flexibility 0 in   not done due to back problems   Single Leg Stand 1.5 seconds             Post Biometrics - 12/26/23 1017        Post  Biometrics   Waist Circumference 44 inches    Hip Circumference 49 inches    Waist to Hip Ratio 0.9 %    Triceps Skinfold 19 mm    % Body  Fat 32.1 %    Grip Strength 34 kg    Flexibility 0 in   not done due to back problems   Single Leg Stand 8 seconds             Nutrition:  Nutrition Therapy & Goals - 12/24/23 1011       Nutrition Therapy   Diet Heart Healthy/Carbohydrate Consistent Diet      Personal Nutrition Goals   Nutrition Goal Patient to identify strategies for reducing cardiovascular risk by attending the [facility] education and nutrition series weekly.   Goal not met.   Personal Goal #2 Patient to improve diet quality by using the plate method as a guide for meal planning to include lean protein/plant protein, fruits, vegetables, whole grains, nonfat dairy as part of a well-balanced diet.   goal in progress.   Personal Goal #3 Patient to identify strategies for weight loss of 0.5-2.0# per week.   goal in progress.   Comments Goals in progress.Freida Busman has medical history of DM2, Chronic systolic heart failure, gastric bypass. He continues ozempic for weight loss/DM2; weight  loss goals have not been met. He is down 5.3# since starting with our program. He did not attend the [facility] education series. He does continue regular follow-up with [facility]. A1c is at goal of <7%. Lipids are well controlled. Patient will continue to benefit from adherance to nutrition, exercise, and lifestyle modification recommendations.      Intervention Plan   Intervention Prescribe, educate and counsel regarding individualized specific dietary modifications aiming towards targeted core components such as weight, hypertension, lipid management, diabetes, heart failure and other comorbidities.;Nutrition handout(s) given to patient.    Expected Outcomes Short Term Goal: Understand basic principles of dietary content, such as calories, fat, sodium, cholesterol and nutrients.;Long Term Goal: Adherence to prescribed nutrition plan.             Nutrition Discharge:   Education Questionnaire Score:  Knowledge  Questionnaire Score - 12/26/23 1003       Knowledge Questionnaire Score   Pre Score 21/24    Post Score 24/24             Goals reviewed with patient; copy given to patient.Pt graduates from  Intensive/Traditional cardiac rehab program today with completion of  27 exercise and  1 education sessions. Pt maintained good attendance and progressed nicely during their participation in rehab as evidenced by increased MET level. Wade increased his distance on his post exercise walk test by 754 feet and lost 2.4 kg.  Medication list reconciled. Repeat  PHQ score- 0 .  Pt has made significant lifestyle changes and should be commended for his success. Yoshiharu achieved his goals during cardiac rehab.   Pt plans to continue exercise at home via walking and will ride his exercise bike. We are proud of Laderrick's progress and weight loss. Everton says he enjoyed participating in cardiac rehab. We are proud of Heywood's progress.! Thayer Headings RN BSN

## 2023-12-31 ENCOUNTER — Encounter (HOSPITAL_COMMUNITY): Payer: Medicare Other

## 2023-12-31 NOTE — Progress Notes (Signed)
 Remote ICD transmission.

## 2023-12-31 NOTE — Addendum Note (Signed)
 Addended by: Geralyn Flash D on: 12/31/2023 12:18 PM   Modules accepted: Orders

## 2024-01-07 ENCOUNTER — Other Ambulatory Visit: Payer: Self-pay | Admitting: Cardiovascular Disease

## 2024-01-16 ENCOUNTER — Other Ambulatory Visit: Payer: Self-pay | Admitting: Cardiovascular Disease

## 2024-01-21 ENCOUNTER — Telehealth: Payer: Self-pay | Admitting: *Deleted

## 2024-01-21 ENCOUNTER — Other Ambulatory Visit: Payer: Self-pay

## 2024-01-21 ENCOUNTER — Inpatient Hospital Stay: Payer: Medicare Other | Attending: Hematology & Oncology

## 2024-01-21 ENCOUNTER — Encounter: Payer: Self-pay | Admitting: Medical Oncology

## 2024-01-21 ENCOUNTER — Inpatient Hospital Stay (HOSPITAL_BASED_OUTPATIENT_CLINIC_OR_DEPARTMENT_OTHER): Payer: Medicare Other | Admitting: Medical Oncology

## 2024-01-21 VITALS — BP 89/61 | HR 79 | Temp 98.4°F | Resp 19 | Ht 71.0 in | Wt 231.1 lb

## 2024-01-21 DIAGNOSIS — Z79899 Other long term (current) drug therapy: Secondary | ICD-10-CM | POA: Diagnosis not present

## 2024-01-21 DIAGNOSIS — I509 Heart failure, unspecified: Secondary | ICD-10-CM | POA: Diagnosis not present

## 2024-01-21 DIAGNOSIS — I428 Other cardiomyopathies: Secondary | ICD-10-CM | POA: Diagnosis not present

## 2024-01-21 DIAGNOSIS — Z86711 Personal history of pulmonary embolism: Secondary | ICD-10-CM | POA: Diagnosis not present

## 2024-01-21 DIAGNOSIS — I2699 Other pulmonary embolism without acute cor pulmonale: Secondary | ICD-10-CM | POA: Insufficient documentation

## 2024-01-21 DIAGNOSIS — J4489 Other specified chronic obstructive pulmonary disease: Secondary | ICD-10-CM

## 2024-01-21 DIAGNOSIS — I959 Hypotension, unspecified: Secondary | ICD-10-CM | POA: Insufficient documentation

## 2024-01-21 DIAGNOSIS — I2782 Chronic pulmonary embolism: Secondary | ICD-10-CM

## 2024-01-21 DIAGNOSIS — Z7901 Long term (current) use of anticoagulants: Secondary | ICD-10-CM | POA: Diagnosis not present

## 2024-01-21 DIAGNOSIS — I82403 Acute embolism and thrombosis of unspecified deep veins of lower extremity, bilateral: Secondary | ICD-10-CM | POA: Insufficient documentation

## 2024-01-21 DIAGNOSIS — Z885 Allergy status to narcotic agent status: Secondary | ICD-10-CM | POA: Insufficient documentation

## 2024-01-21 LAB — CBC WITH DIFFERENTIAL (CANCER CENTER ONLY)
Abs Immature Granulocytes: 0.02 10*3/uL (ref 0.00–0.07)
Basophils Absolute: 0 10*3/uL (ref 0.0–0.1)
Basophils Relative: 0 %
Eosinophils Absolute: 0.1 10*3/uL (ref 0.0–0.5)
Eosinophils Relative: 1 %
HCT: 45.1 % (ref 39.0–52.0)
Hemoglobin: 14.3 g/dL (ref 13.0–17.0)
Immature Granulocytes: 0 %
Lymphocytes Relative: 28 %
Lymphs Abs: 2.6 10*3/uL (ref 0.7–4.0)
MCH: 30 pg (ref 26.0–34.0)
MCHC: 31.7 g/dL (ref 30.0–36.0)
MCV: 94.7 fL (ref 80.0–100.0)
Monocytes Absolute: 0.5 10*3/uL (ref 0.1–1.0)
Monocytes Relative: 6 %
Neutro Abs: 6.1 10*3/uL (ref 1.7–7.7)
Neutrophils Relative %: 65 %
Platelet Count: 246 10*3/uL (ref 150–400)
RBC: 4.76 MIL/uL (ref 4.22–5.81)
RDW: 13.7 % (ref 11.5–15.5)
WBC Count: 9.4 10*3/uL (ref 4.0–10.5)
nRBC: 0 % (ref 0.0–0.2)

## 2024-01-21 LAB — CMP (CANCER CENTER ONLY)
ALT: 24 U/L (ref 0–44)
AST: 20 U/L (ref 15–41)
Albumin: 4.1 g/dL (ref 3.5–5.0)
Alkaline Phosphatase: 104 U/L (ref 38–126)
Anion gap: 8 (ref 5–15)
BUN: 22 mg/dL (ref 8–23)
CO2: 27 mmol/L (ref 22–32)
Calcium: 9.2 mg/dL (ref 8.9–10.3)
Chloride: 107 mmol/L (ref 98–111)
Creatinine: 1.63 mg/dL — ABNORMAL HIGH (ref 0.61–1.24)
GFR, Estimated: 43 mL/min — ABNORMAL LOW (ref >60)
Glucose, Bld: 156 mg/dL — ABNORMAL HIGH (ref 70–99)
Potassium: 5.5 mmol/L — ABNORMAL HIGH (ref 3.5–5.1)
Sodium: 142 mmol/L (ref 135–145)
Total Bilirubin: 0.4 mg/dL (ref 0.0–1.2)
Total Protein: 6.6 g/dL (ref 6.5–8.1)

## 2024-01-21 MED ORDER — APIXABAN 5 MG PO TABS: 5.0000 mg | ORAL_TABLET | Freq: Two times a day (BID) | ORAL | 3 refills | Status: AC

## 2024-01-21 NOTE — Telephone Encounter (Signed)
 Unable to reach pt by phone. LVM for pt with MD recommendations to return to clinic in 2 days for repeat lab work to check K+ level. Pt to expect call from scheduling with appt information.

## 2024-01-21 NOTE — Progress Notes (Signed)
 Hematology and Oncology Follow Up Visit  Steven Beard [id number] 07/04/1946 78 y.o. 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 twice daily     Interim History:  [name] is back for follow-up for his bilateral PEs  Today he reports that he has been well. He is working closely with the CHF clinic and reports that he has done well. He just finished cardiac rehab- this went well.   We repeated his CT angiogram on 10/25/2023.  This did not show any evidence of residual pulmonary emboli.  He also had Dopplers of his legs.  This was also done on 10/25/2023.  This showed no evidence of residual thromboembolic disease.  He is going need to be on lifelong anticoagulation per Dr. Maria Shiner given his congestive heart failure and PE history.   He has had no problems with cough.  There is been no chest wall pain.  He says he feels somewhat good right now.  Some of this has to do with medicine adjustment.  He has had no bleeding.  Has been no problems with bowels or bladder.  Overall, I would say that his performance status is ECOG 1. Wt Readings from Last 3 Encounters:  01/21/24 231 lb 1.3 oz (104.8 kg)  12/26/23 231 lb 7.7 oz (105 kg)  11/23/23 233 lb (105.7 kg)   Medications:  Current Outpatient Medications:    ACCU-CHEK GUIDE test strip, , Disp: , Rfl:    Accu-Chek Softclix Lancets lancets, , Disp: , Rfl:    acetaminophen (TYLENOL) 500 MG tablet, Take 1,000 mg by mouth every 6 (six) hours as needed for moderate pain or headache., Disp: , Rfl:    ALPRAZolam (XANAX) 1 MG tablet, Take 1 mg by mouth at bedtime., Disp: , Rfl:    apixaban (ELIQUIS) 5 MG TABS tablet, Take 1 tablet (5 mg total) by mouth 2 (two) times daily., Disp: 60 tablet, Rfl: 3   Ascorbic Acid (VITAMIN C PO), Take by mouth daily., Disp: , Rfl:    Blood Glucose Monitoring Suppl (ACCU-CHEK  GUIDE ME) w/Device KIT, , Disp: , Rfl:    buPROPion (WELLBUTRIN XL) 150 MG 24 hr tablet, Take 150 mg by mouth daily., Disp: , Rfl:    buPROPion (WELLBUTRIN XL) 300 MG 24 hr tablet, Take 300 mg by mouth daily. Patient says dose was increased to 450 mg once a day, Disp: , Rfl:    Calcium Citrate-Vitamin D (CALCIUM CITRATE + PO), Take by mouth daily., Disp: , Rfl:    carvedilol (COREG) 3.125 MG tablet, TAKE 1 TABLET BY MOUTH TWICE  DAILY WITH MEALS, Disp: 180 tablet, Rfl: 3   Cholecalciferol (VITAMIN D3) 50 MCG (2000 UT) capsule, Take 1 capsule by mouth every morning., Disp: , Rfl:    cyanocobalamin (,VITAMIN B-12,) 1000 MCG/ML injection, Inject 1,000 mcg into the muscle every 30 (thirty) days., Disp: , Rfl:    finasteride (PROSCAR) 5 MG tablet, Take 5 mg by mouth at bedtime., Disp: , Rfl:    fluorouracil (EFUDEX) 5 % cream, Apply topically 2 (two) times daily., Disp: , Rfl:    JARDIANCE 10 MG TABS tablet, Take 1 tablet (10 mg total) by mouth daily., Disp: 30 tablet, Rfl: 11   levothyroxine (SYNTHROID, LEVOTHROID) 175 MCG tablet, Take 175 mcg by mouth See admin instructions. Take Monday - Saturday, Disp: , Rfl:    midodrine (PROAMATINE) 2.5 MG tablet, Take 2  tablets (5 mg total) by mouth 3 (three) times daily with meals., Disp: 540 tablet, Rfl: 3   Multiple Vitamins-Minerals (PRESERVISION AREDS) TABS, Take 1 tablet by mouth 2 (two) times daily. , Disp: , Rfl:    ondansetron (ZOFRAN-ODT) 4 MG disintegrating tablet, Take 1 tablet by mouth 2 (two) times daily as needed., Disp: , Rfl:    OZEMPIC, 0.25 OR 0.5 MG/DOSE, 2 MG/3ML SOPN, Inject 0.5 mg into the skin once a week., Disp: , Rfl:    tamsulosin (FLOMAX) 0.4 MG CAPS capsule, Take 0.4 mg by mouth at bedtime., Disp: , Rfl:    traMADol (ULTRAM) 50 MG tablet, Take 1 tablet (50 mg total) by mouth every 6 (six) hours as needed for moderate pain or severe pain., Disp: 25 tablet, Rfl: 0  Allergies:  Allergies  Allergen Reactions   Minocycline Hives and  Other (See Comments)   Codeine Itching, Hives and Other (See Comments)    Other reaction(s): ITCHING    Past Medical History, Surgical history, Social history, and Family History were reviewed and updated.  Review of Systems: Review of Systems  Constitutional: Negative.   HENT:  Negative.    Eyes: Negative.   Respiratory: Negative.    Cardiovascular: Negative.   Gastrointestinal: Negative.   Endocrine: Negative.   Genitourinary: Negative.    Musculoskeletal: Negative.   Skin: Negative.   Neurological: Negative.   Hematological: Negative.   Psychiatric/Behavioral: Negative.      Physical Exam:  height is 5\' 11"  (1.803 m) and weight is 231 lb 1.3 oz (104.8 kg). His oral temperature is 98.4 F (36.9 C). His blood pressure is 89/61 (abnormal) and his pulse is 79. His respiration is 19 and oxygen saturation is 96%.   Wt Readings from Last 3 Encounters:  01/21/24 231 lb 1.3 oz (104.8 kg)  12/26/23 231 lb 7.7 oz (105 kg)  11/23/23 233 lb (105.7 kg)    Physical Exam Vitals reviewed.  HENT:     Head: Normocephalic and atraumatic.  Eyes:     Pupils: Pupils are equal, round, and reactive to light.  Cardiovascular:     Rate and Rhythm: Normal rate and regular rhythm.     Heart sounds: Normal heart sounds.  Pulmonary:     Effort: Pulmonary effort is normal.     Breath sounds: Normal breath sounds.  Abdominal:     General: Bowel sounds are normal.     Palpations: Abdomen is soft.  Musculoskeletal:        General: No tenderness or deformity. Normal range of motion.     Cervical back: Normal range of motion.  Lymphadenopathy:     Cervical: No cervical adenopathy.  Skin:    General: Skin is warm and dry.     Findings: No erythema or rash.  Neurological:     Mental Status: He is alert and oriented to person, place, and time.  Psychiatric:        Behavior: Behavior normal.        Thought Content: Thought content normal.        Judgment: Judgment normal.      Lab  Results  Component Value Date   WBC 9.4 01/21/2024   HGB 14.3 01/21/2024   HCT 45.1 01/21/2024   MCV 94.7 01/21/2024   PLT 246 01/21/2024     Chemistry      Component Value Date/Time   NA 142 01/21/2024 1144   NA 140 06/29/2020 0938   K 5.5 (H) 01/21/2024 1144  CL 107 01/21/2024 1144   CO2 27 01/21/2024 1144   BUN 22 01/21/2024 1144   BUN 30 (H) 06/29/2020 0938   CREATININE 1.63 (H) 01/21/2024 1144   CREATININE 1.30 (H) 01/30/2022 1211      Component Value Date/Time   CALCIUM 9.2 01/21/2024 1144   ALKPHOS 104 01/21/2024 1144   AST 20 01/21/2024 1144   ALT 24 01/21/2024 1144   BILITOT 0.4 01/21/2024 1144      Impression and Plan: Mr. Staples is a very nice 78 year old white male.  He has congestive heart failure.  He developed pulmonary emboli and thromboembolic disease in his legs.  He is on lifelong anticoagulation for his unprovoked Pes and congestive heart failure history.   Hypotension is stable for patient. He is feeling well. He will continue to work closely with his cardiology team.  He will continue his Eliquis at current dose. Refill sent to mail order pharmacy as requested by patient CBC is stable and does not show any anemia CMP is stable for patient  We will plan to see him again in 3-6 months with labs (CBC, CMP)- he is unsure when he will be returning to the area from his summer home. He wishes to call to schedule when he knows his schedule better.    Sharla Davis, PA-C 4/14/202512:27 PM

## 2024-01-21 NOTE — Telephone Encounter (Signed)
-----   Message from Ivor Mars sent at 01/21/2024  3:08 PM EDT ----- Please call and let him know that the potassium is on the high side.  We need to have this repeated.  Please have him come back for lab work only in 2 days.  Thanks.  Twilla Galea

## 2024-01-22 ENCOUNTER — Encounter: Payer: Self-pay | Admitting: Hematology & Oncology

## 2024-01-31 ENCOUNTER — Ambulatory Visit: Attending: Cardiology | Admitting: Cardiology

## 2024-01-31 VITALS — BP 110/65 | Ht 71.0 in | Wt 228.0 lb

## 2024-01-31 DIAGNOSIS — G4733 Obstructive sleep apnea (adult) (pediatric): Secondary | ICD-10-CM | POA: Insufficient documentation

## 2024-01-31 NOTE — Progress Notes (Signed)
 Virtual Visit via Video Note   This visit type was conducted due to national recommendations for restrictions regarding the COVID-19 Pandemic (e.g. social distancing) in an effort to limit this patient's exposure and mitigate transmission in our community.  Due to his co-morbid illnesses, this patient is at least at moderate risk for complications without adequate follow up.  This format is felt to be most appropriate for this patient at this time.  All issues noted in this document were discussed and addressed.  A limited physical exam was performed with this format.  Please refer to the patient's chart for his consent to telehealth for [facility].  Date:  01/31/2024   ID:  Steven Beard, DOB 1946-01-05, MRN [id number] The patient was identified using 2 identifiers.  Patient Location: Home Provider Location: Home Office   PCP:  Pcp, No   [facility] Providers Cardiologist:  Steven Alert, MD Electrophysiologist:  Steven Grange, MD  Sleep Medicine:  Steven Keas, MD     Evaluation Performed:  Follow-Up Visit  Chief Complaint:  OSA  History of Present Illness:    Steven Beard is a 78 y.o. male with  a hx of NICM followed by Dr. Julane Beard, morbid obesity s/p gastric bypass, DM2 and chronic systolic CHF who is referred initially to me for evaluation for sleep apnea.   He did have OSA prior to gastric bypass and was on CPAP but have losing weight he stopped using his PAP and had not been checked since his surgery for residual apnea.  He had not been using a CPAP.     A home sleep study was ordered showing severe OSA with an AHI of 47.3/hr with mild central sleep apnea with pAHIc of 6.7/hr and nocturnal hypoxemia with a minimum O2 sat of 78% and < 88% for 15 minutes c/w nocturnal hypoxemia. He was started on auto CPAP.  He is doing well with his PAP device and thinks that he has gotten used to it.  He tolerates the full face mask and feels the pressure is adequate.  Since  going on PAP he feels rested in the am and has no significant daytime sleepiness but does occasionally take a nap during the day.  He denies any significant mouth or nasal dryness or nasal congestion.  He does not think that he snores.      Past Medical History:  Diagnosis Date   Hidradenitis    History of BPH    benign prostatic hypertrophy   History of hydrocele    in right testicle    Hypothyroidism    NICM (nonischemic cardiomyopathy) (HCC)    OSA (obstructive sleep apnea) 12/08/2020   Severe OSA with an AHI of 47.3/hr with mild central sleep apnea with pAHIc of 6.7/hr and nocturnal hypoxemia with a minimum O2 sat of 78% and < 88% for 15 minutes c/w nocturnal hypoxemia.   uses cpap nightly   S/P gastric bypass    Past Surgical History:  Procedure Laterality Date   CARDIAC CATHETERIZATION  05/19/2020   CARDIOVASCULAR STRESS TEST     CARDIOLYTE ST LATE 2000's AT [facility] OFFICE REPORTEDLY NEGATIVE FOR ISCHEMIA   ECHO DOPPLER COMPLETE(TRANSTHOR) ([facility] HX)     11/2010 HT ECHO W/COLORFLOW/SPECTRAL ([facility] HISTORICAL RESULT) LVEF 35%, GLOBAL HK, MILD MR/TR/AR, 2013 STABLE LVEF 35% GLOBAL HK, NO CHANGE C/W 2012, LVEF 40% WITH GLOBAL HK; TRIVIAL MR/AR, NO EFFUSION   GASTRIC BYPASS     hx   HYDRADENITIS EXCISION Left 12/30/2020  Procedure: EXCISION HIDRADENITIS LEFT INNER THIGH;  Surgeon: Oza Blumenthal, MD;  Location: [facility] OR;  Service: General;  Laterality: Left;   HYDROCELE EXCISION  2012   REPAIR   RIGHT/LEFT HEART CATH AND CORONARY ANGIOGRAPHY N/A 05/19/2020   Procedure: RIGHT/LEFT HEART CATH AND CORONARY ANGIOGRAPHY;  Surgeon: Lucendia Rusk, MD;  Location: [facility] INVASIVE CV LAB;  Service: Cardiovascular;  Laterality: N/A;   THYROIDECTOMY       Current Meds  Medication Sig   ACCU-CHEK GUIDE test strip    Accu-Chek Softclix Lancets lancets    acetaminophen  (TYLENOL ) 500 MG tablet Take 1,000 mg by mouth every 6 (six) hours as needed for moderate pain or headache.   ALPRAZolam (XANAX) 1  MG tablet Take 1 mg by mouth at bedtime.   apixaban  (ELIQUIS ) 5 MG TABS tablet Take 1 tablet (5 mg total) by mouth 2 (two) times daily.   Ascorbic Acid (VITAMIN C PO) Take by mouth daily.   Blood Glucose Monitoring Suppl (ACCU-CHEK GUIDE ME) w/Device KIT    buPROPion (WELLBUTRIN XL) 150 MG 24 hr tablet Take 150 mg by mouth daily.   buPROPion (WELLBUTRIN XL) 300 MG 24 hr tablet Take 300 mg by mouth daily. Patient says dose was increased to 450 mg once a day   Calcium Citrate-Vitamin D (CALCIUM CITRATE + PO) Take by mouth daily.   carvedilol  (COREG ) 3.125 MG tablet TAKE 1 TABLET BY MOUTH TWICE  DAILY WITH MEALS   Cholecalciferol (VITAMIN D3) 50 MCG (2000 UT) capsule Take 1 capsule by mouth every morning.   cyanocobalamin (,VITAMIN B-12,) 1000 MCG/ML injection Inject 1,000 mcg into the muscle every 30 (thirty) days.   finasteride (PROSCAR) 5 MG tablet Take 5 mg by mouth at bedtime.   fluorouracil (EFUDEX) 5 % cream Apply topically 2 (two) times daily.   JARDIANCE  10 MG TABS tablet Take 1 tablet (10 mg total) by mouth daily.   levothyroxine (SYNTHROID, LEVOTHROID) 175 MCG tablet Take 175 mcg by mouth See admin instructions. Take Monday - Saturday   midodrine  (PROAMATINE ) 2.5 MG tablet Take 2 tablets (5 mg total) by mouth 3 (three) times daily with meals.   Multiple Vitamins-Minerals (PRESERVISION AREDS) TABS Take 1 tablet by mouth 2 (two) times daily.    ondansetron  (ZOFRAN -ODT) 4 MG disintegrating tablet Take 1 tablet by mouth 2 (two) times daily as needed.   OZEMPIC, 0.25 OR 0.5 MG/DOSE, 2 MG/3ML SOPN Inject 0.5 mg into the skin once a week.   tamsulosin (FLOMAX) 0.4 MG CAPS capsule Take 0.4 mg by mouth at bedtime.   traMADol  (ULTRAM ) 50 MG tablet Take 1 tablet (50 mg total) by mouth every 6 (six) hours as needed for moderate pain or severe pain.     Allergies:   Minocycline and Codeine   Social History   Tobacco Use   Smoking status: Never    Passive exposure: Never   Smokeless tobacco:  Never  Vaping Use   Vaping status: Never Used  Substance Use Topics   Alcohol use: Yes    Alcohol/week: 4.0 standard drinks of alcohol    Types: 4 Glasses of wine per week   Drug use: Never     Family Hx: The patient's family history includes Heart disease in his father.  ROS:   Please see the history of present illness.     All other systems reviewed and are negative.   Prior CV studies:   The following studies were reviewed today:  PAP compliance download  Labs/Other Tests  and Data Reviewed:    EKG:  No ECG reviewed.  Recent Labs: 01/21/2024: ALT 24; BUN 22; Creatinine 1.63; Hemoglobin 14.3; Platelet Count 246; Potassium 5.5; Sodium 142   Recent Lipid Panel No results found for: "CHOL", "TRIG", "HDL", "CHOLHDL", "LDLCALC", "LDLDIRECT"  Wt Readings from Last 3 Encounters:  01/31/24 228 lb (103.4 kg)  01/21/24 231 lb 1.3 oz (104.8 kg)  12/26/23 231 lb 7.7 oz (105 kg)     Risk Assessment/Calculations:          Objective:    Vital Signs:  BP 110/65   Ht 5\' 11"  (1.803 m)   Wt 228 lb (103.4 kg)   BMI 31.80 kg/m   Well nourished, well developed male in no acute distress. Well appearing, Beard and conversant, regular work of breathing,  good skin color  Eyes- anicteric mouth- oral mucosa is pink  neuro- grossly intact skin- no apparent rash or lesions or cyanosis ASSESSMENT & PLAN:    #OSA - The patient is tolerating PAP therapy well without any problems. The PAP download performed by his DME was personally reviewed and interpreted by me today and showed an AHI of 1.3 /hr on auto CPAP from 4-20 cm H2O with 100% compliance in using more than 4 hours nightly.  The patient has been using and benefiting from PAP use and will continue to benefit from therapy.   Time:   Today, I have spent 10 minutes with the patient with telehealth technology discussing the above problems.     Medication Adjustments/Labs and Tests Ordered: Current medicines are reviewed at length  with the patient today.  Concerns regarding medicines are outlined above.   Tests Ordered: No orders of the defined types were placed in this encounter.   Medication Changes: No orders of the defined types were placed in this encounter.   Follow Up:  In Person in 1 year(s)  Signed, Steven Keas, MD  01/31/2024 9:09 AM    [facility]

## 2024-01-31 NOTE — Patient Instructions (Signed)
 Medication Instructions:  Your physician recommends that you continue on your current medications as directed. Please refer to the Current Medication list given to you today.  *If you need a refill on your cardiac medications before your next appointment, please call your pharmacy*  Lab Work: If you have labs (blood work) drawn today and your tests are completely normal, you will receive your results only by: MyChart Message (if you have MyChart) OR A paper copy in the mail If you have any lab test that is abnormal or we need to change your treatment, we will call you to review the results.  Follow-Up: At [facility], you and your health needs are our priority.  As part of our continuing mission to provide you with exceptional heart care, our providers are all part of one team.  This team includes your primary Cardiologist (physician) and Advanced Practice Providers or APPs (Physician Assistants and Nurse Practitioners) who all work together to provide you with the care you need, when you need it.  Your next appointment:   1 year(s)  Provider:   Dr. Micael Adas     We recommend signing up for the patient portal called "MyChart".  Sign up information is provided on this After Visit Summary.  MyChart is used to connect with patients for Virtual Visits (Telemedicine).  Patients are able to view lab/test results, encounter notes, upcoming appointments, etc.  Non-urgent messages can be sent to your provider as well.   To learn more about what you can do with MyChart, go to [email or web address].   Other Instructions      1st Floor: - Lobby - Registration  - Pharmacy  - Lab - Cafe  2nd Floor: - PV Lab - Diagnostic Testing (echo, CT, nuclear med)  3rd Floor: - Vacant  4th Floor: - TCTS (cardiothoracic surgery) - AFib Clinic - Structural Heart Clinic - Vascular Surgery  - Vascular Ultrasound  5th Floor: - HeartCare Cardiology (general and EP) - Clinical Pharmacy for  coumadin, hypertension, lipid, weight-loss medications, and med management appointments    Valet parking services will be available as well.

## 2024-02-01 ENCOUNTER — Telehealth: Payer: Self-pay | Admitting: *Deleted

## 2024-02-05 NOTE — Telephone Encounter (Signed)
 The patient has been notified of the result via his nychart.Gaylene Kays, CMA 02/05/2024 7:12 PM

## 2024-02-18 ENCOUNTER — Ambulatory Visit

## 2024-02-18 DIAGNOSIS — I5022 Chronic systolic (congestive) heart failure: Secondary | ICD-10-CM | POA: Diagnosis not present

## 2024-02-19 LAB — CUP PACEART REMOTE DEVICE CHECK
Battery Remaining Longevity: 122 mo
Battery Voltage: 3.01 V
Brady Statistic AP VP Percent: 7.17 %
Brady Statistic AP VS Percent: 0.11 %
Brady Statistic AS VP Percent: 90.49 %
Brady Statistic AS VS Percent: 2.24 %
Brady Statistic RA Percent Paced: 7.28 %
Brady Statistic RV Percent Paced: 0 %
Date Time Interrogation Session: 20250511193928
HighPow Impedance: 68 Ohm
Implantable Lead Connection Status: 753985
Implantable Lead Connection Status: 753985
Implantable Lead Connection Status: 753985
Implantable Lead Implant Date: 20240710
Implantable Lead Implant Date: 20240710
Implantable Lead Implant Date: 20240710
Implantable Lead Location: 753858
Implantable Lead Location: 753859
Implantable Lead Location: 753860
Implantable Lead Model: 3830
Implantable Lead Model: 3830
Implantable Pulse Generator Implant Date: 20240710
Lead Channel Impedance Value: 209 Ohm
Lead Channel Impedance Value: 285 Ohm
Lead Channel Impedance Value: 418 Ohm
Lead Channel Impedance Value: 418 Ohm
Lead Channel Impedance Value: 456 Ohm
Lead Channel Impedance Value: 589 Ohm
Lead Channel Pacing Threshold Amplitude: 0.75 V
Lead Channel Pacing Threshold Pulse Width: 0.4 ms
Lead Channel Sensing Intrinsic Amplitude: 1 mV
Lead Channel Sensing Intrinsic Amplitude: 30.4 mV
Lead Channel Setting Pacing Amplitude: 1.75 V
Lead Channel Setting Pacing Amplitude: 2.5 V
Lead Channel Setting Pacing Pulse Width: 0.4 ms
Lead Channel Setting Sensing Sensitivity: 0.3 mV
Zone Setting Status: 755011
Zone Setting Status: 755011
Zone Setting Status: 755011

## 2024-02-22 ENCOUNTER — Ambulatory Visit: Payer: Self-pay | Admitting: Cardiovascular Disease

## 2024-02-23 ENCOUNTER — Other Ambulatory Visit: Payer: Self-pay | Admitting: Cardiovascular Disease

## 2024-02-25 NOTE — Telephone Encounter (Signed)
 This is a CHF pt

## 2024-02-26 ENCOUNTER — Other Ambulatory Visit (HOSPITAL_COMMUNITY): Payer: Self-pay | Admitting: Cardiology

## 2024-02-26 MED ORDER — MIDODRINE HCL 2.5 MG PO TABS: 5.0000 mg | ORAL_TABLET | Freq: Three times a day (TID) | ORAL | 3 refills | Status: AC

## 2024-03-12 ENCOUNTER — Ambulatory Visit (HOSPITAL_COMMUNITY)
Admission: RE | Admit: 2024-03-12 | Discharge: 2024-03-12 | Disposition: A | Discharge status: Home / Self Care | Source: Ambulatory Visit | Attending: Obstetrics and Gynecology | Admitting: Obstetrics and Gynecology

## 2024-03-12 ENCOUNTER — Ambulatory Visit (HOSPITAL_COMMUNITY): Payer: Self-pay

## 2024-03-12 ENCOUNTER — Ambulatory Visit (HOSPITAL_BASED_OUTPATIENT_CLINIC_OR_DEPARTMENT_OTHER)
Admission: RE | Admit: 2024-03-12 | Discharge: 2024-03-12 | Disposition: A | Discharge status: Home / Self Care | Source: Ambulatory Visit | Attending: Cardiology | Admitting: Cardiology

## 2024-03-12 VITALS — BP 118/80 | HR 74 | Wt 230.0 lb

## 2024-03-12 DIAGNOSIS — I951 Orthostatic hypotension: Secondary | ICD-10-CM | POA: Insufficient documentation

## 2024-03-12 DIAGNOSIS — E119 Type 2 diabetes mellitus without complications: Secondary | ICD-10-CM | POA: Diagnosis not present

## 2024-03-12 DIAGNOSIS — Z7985 Long-term (current) use of injectable non-insulin antidiabetic drugs: Secondary | ICD-10-CM | POA: Diagnosis not present

## 2024-03-12 DIAGNOSIS — I358 Other nonrheumatic aortic valve disorders: Secondary | ICD-10-CM | POA: Insufficient documentation

## 2024-03-12 DIAGNOSIS — Z79899 Other long term (current) drug therapy: Secondary | ICD-10-CM | POA: Insufficient documentation

## 2024-03-12 DIAGNOSIS — I340 Nonrheumatic mitral (valve) insufficiency: Secondary | ICD-10-CM | POA: Insufficient documentation

## 2024-03-12 DIAGNOSIS — I428 Other cardiomyopathies: Secondary | ICD-10-CM | POA: Diagnosis not present

## 2024-03-12 DIAGNOSIS — Z7901 Long term (current) use of anticoagulants: Secondary | ICD-10-CM | POA: Diagnosis not present

## 2024-03-12 DIAGNOSIS — Z86711 Personal history of pulmonary embolism: Secondary | ICD-10-CM | POA: Insufficient documentation

## 2024-03-12 DIAGNOSIS — I447 Left bundle-branch block, unspecified: Secondary | ICD-10-CM | POA: Diagnosis not present

## 2024-03-12 DIAGNOSIS — Z951 Presence of aortocoronary bypass graft: Secondary | ICD-10-CM | POA: Insufficient documentation

## 2024-03-12 DIAGNOSIS — Z95810 Presence of automatic (implantable) cardiac defibrillator: Secondary | ICD-10-CM

## 2024-03-12 DIAGNOSIS — I5022 Chronic systolic (congestive) heart failure: Secondary | ICD-10-CM | POA: Diagnosis not present

## 2024-03-12 DIAGNOSIS — G4733 Obstructive sleep apnea (adult) (pediatric): Secondary | ICD-10-CM | POA: Insufficient documentation

## 2024-03-12 DIAGNOSIS — Z9884 Bariatric surgery status: Secondary | ICD-10-CM | POA: Insufficient documentation

## 2024-03-12 LAB — ECHOCARDIOGRAM COMPLETE
Area-P 1/2: 4.15 cm2
Calc EF: 19.1 %
S' Lateral: 7.4 cm
Single Plane A2C EF: 24.5 %
Single Plane A4C EF: 4.9 %

## 2024-03-12 MED ORDER — JARDIANCE 10 MG PO TABS: 10.0000 mg | ORAL_TABLET | Freq: Every day | ORAL | 3 refills | Status: AC

## 2024-03-12 MED ORDER — PERFLUTREN LIPID MICROSPHERE
1.0000 mL | INTRAVENOUS | Status: DC | PRN
  Administered 2024-03-12: 3 mL via INTRAVENOUS

## 2024-03-12 NOTE — Progress Notes (Signed)
 ADVANCED HEART FAILURE FOLLOW UP CLINIC NOTE  Referring Physician: Lauralee Poll, MD  Primary Care: Pcp, No Primary Cardiologist:  HPI: Steven Beard is a 78 y.o. male PMH of nonischemic cardiomyopathy, bypass surgery, orthostatic hypotension, left bundle branch block with biventricular ICD placement, h/o goiter s/p thyroid  resection, pulmonary embolism who presents for follow up of heart failure.      Previously followed by Dr. Tonie Franks in [place] and moved to [place] and established with Dr. Alroy Aspen in 2017.   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 First told he had a low EF in 2005 after gastric bypass. Was referred to Dr. Tonie Franks. Did not have a cath at that time. Moved to [place] in 2017.  He was originally followed by Dr. Bensimhon for congestive heart failure, his main symptoms being hypotension and fatigue.  Given his profound orthostatic hypotension he underwent workup for TTR amyloid which was unremarkable.  He was started on midodrine  with improvement in his symptoms.  MRI showed ejection fraction 44% with minimal LGE and he overall has been doing well.   Patient was hospitalized for pulmonary embolism in 2024 and at that time was found to have a severely reduced ejection fraction along with new left bundle branch block.  A biventricular defibrillator was placed and he returned to [facility] for further follow-up.     SUBJECTIVE:  Patient overall states that he is doing fairly well. Still has some fatigue if standing for a long period of time, but otherwise completed cardiac rehab and has no recent complaints. BP and symptoms controlled on midodrine  5mg  TID. Discussed his echocardiogram results at length. We also had a long discussion about potential advanced therapies including LVAD. He is a fairly reasonable candidate at this point in time despite his age given his severely dilated LV, near normal RV function, maintained functional status. We discussed the natural  history of heart failure. He shared some hesitancy towards LVAD, but was encouraged to see his wife met a VAD patient in the waiting room and had a good experience.   PMH, current medications, allergies, social history, and family history reviewed in epic.  PHYSICAL EXAM: Vitals:   03/12/24 0852  BP: 118/80  Pulse: 74  SpO2: 98%    GENERAL: NAD  PULM:  Normal work of breathing, clear to auscultation bilaterally. Respirations are unlabored.  CARDIAC:  JVP: Not elevated         Normal rate with regular rhythm. No murmurs, rubs or gallops.  No lower extremity edema. Warm and well perfused extremities. ABDOMEN: Soft, non-tender, non-distended. NEUROLOGIC: Patient is oriented x3 with no focal or lateralizing neurologic deficits.    DATA REVIEW  ECG: Atrially sensed, biventricularly paced rhythm   ECHO: Echo 4/21: EF 30-35% RV ok.  BiV pacing reviewed and greater than 98%   Echo 2024: Reported from [place] , evidently EF now 20 to 25% with moderate LV dilation  03/12/24: LVEF 20-25%, severely dilated LV, mildly reduced RV function but normal size. Potential early LV thrombus   CATH: Cath 05/19/20: EF 25-35% Minimal nonobstructive CAD RA = 7 PA = 40/17 (26)  PCWP 15 CO/CI 7.8/3.4   cMRI 10/21: EF 44%, RV ok, very small area of subendocardial apical inferior LGE ? significance  Heart failure review: - Classification: Heart failure with reduced EF - Etiology: Idiopathic - NYHA Class: III - Volume status: Euvolemic - ACEi/ARB/ARNI: Intolerant - Aldosterone antagonist: Intolerant - Beta-blocker: Maximally tolerated dose - Digoxin : Plan to start at  a subsequent visit - Hydralazine /Nitrates: Intolerant - SGLT2i: Maximally tolerated dose - GLP-1: Already on GLP-1 - Advanced therapies: Not needed at this time - ICD: Already in place  ASSESSMENT & PLAN:  Chronic systolic heart failure due to NICM: NICM, EF now 20-25 with significant negative remodeling. NYHA class III and  stable, intolerance to GDMT on midodrine . Relatively good quality of life at the moment, planned CPX testing in the next few months. Long discussion as above about LVAD candidacy and potential advanced therapy options. He will discuss it over the next several months and will revisit when he is back from [place] .  - CPX to evaluate ongoing fatigue (bike), hopefully in the next few weeks to help assess his functional status as well as HF limitation - Inability to tolerate GDMT, continue midodrine  5mg  TID - Continue carvedilol  3.125mg  BID, and jardiance  10mg  daily  - Device checks arranged for here now, supoptimal response to BiV pacing   Orthostatic hypotension:  Improved with the addition of midodrine . -Continue midodrine  5 mg 3 times daily -Not on diuretics as above   Pulmonary embolism: Noted given worsening shortness of breath in [place] .  On anticoagulation. -Continue apixaban  2.5 mg twice daily, follow-up with hematology   Severe OSA - Sleep Study on 10/21 AHA 47.3/hr - compliant with CPAP - followed by Dr. Micael Adas   DM2 - Per PCP   Morbid obesity s/p gastric bypass: -Continues on Ozempic  Follow up in 4 months, will arrange for VAD coordinator visit  Arta Lark, MD Advanced Heart Failure Mechanical Circulatory Support 03/13/24

## 2024-03-12 NOTE — Patient Instructions (Signed)
 There has been no changes to your medications.  Your physician recommends that you schedule a follow-up appointment in: 4 months (October) ** PLEASE CALL THE OFFICE IN [place] TO ARRANGE YOUR FOLLOW UP APPOINTMENT.**  If you have any questions or concerns before your next appointment please send us  a message through [place] or call our office at [phone].    TO LEAVE A MESSAGE FOR THE NURSE SELECT OPTION 2, PLEASE LEAVE A MESSAGE INCLUDING: YOUR NAME DATE OF BIRTH CALL BACK NUMBER REASON FOR CALL**this is important as we prioritize the call backs  YOU WILL RECEIVE A CALL BACK THE SAME DAY AS LONG AS YOU CALL BEFORE 4:00 PM  At the [facility], you and your health needs are our priority. As part of our continuing mission to provide you with exceptional heart care, we have created designated Provider Care Teams. These Care Teams include your primary Cardiologist (physician) and Advanced Practice Providers (APPs- Physician Assistants and Nurse Practitioners) who all work together to provide you with the care you need, when you need it.   You may see any of the following providers on your designated Care Team at your next follow up: Dr Jules Oar Dr Peder Bourdon Dr. Alwin Baars Dr. Arta Lark Amy Marijane Shoulders, NP Ruddy Corral, [place] [facility] [place], [place] Dennise Fitz, NP [place] Lee, NP Shawnee Dellen, NP Luster Salters, PharmD Bevely Brush, PharmD   Please be sure to bring in all your medications bottles to every appointment.    Thank you for choosing [facility]

## 2024-04-08 NOTE — Progress Notes (Signed)
 Remote ICD transmission.

## 2024-04-08 NOTE — Addendum Note (Signed)
 Addended by: TAWNI DRILLING D on: 04/08/2024 01:06 PM   Modules accepted: Orders

## 2024-05-19 ENCOUNTER — Ambulatory Visit

## 2024-05-19 DIAGNOSIS — I5022 Chronic systolic (congestive) heart failure: Secondary | ICD-10-CM | POA: Diagnosis not present

## 2024-05-21 LAB — CUP PACEART REMOTE DEVICE CHECK
Battery Remaining Longevity: 118 mo
Battery Voltage: 3.01 V
Brady Statistic RV Percent Paced: 0 %
Date Time Interrogation Session: 20250812115014
HighPow Impedance: 67 Ohm
Implantable Lead Connection Status: 753985
Implantable Lead Connection Status: 753985
Implantable Lead Connection Status: 753985
Implantable Lead Implant Date: 20240710
Implantable Lead Implant Date: 20240710
Implantable Lead Implant Date: 20240710
Implantable Lead Location: 753858
Implantable Lead Location: 753859
Implantable Lead Location: 753860
Implantable Lead Model: 3830
Implantable Lead Model: 3830
Implantable Pulse Generator Implant Date: 20240710
Lead Channel Impedance Value: 209 Ohm
Lead Channel Impedance Value: 285 Ohm
Lead Channel Impedance Value: 418 Ohm
Lead Channel Impedance Value: 437 Ohm
Lead Channel Impedance Value: 513 Ohm
Lead Channel Impedance Value: 665 Ohm
Lead Channel Pacing Threshold Amplitude: 0.75 V
Lead Channel Pacing Threshold Pulse Width: 0.4 ms
Lead Channel Sensing Intrinsic Amplitude: 1.1 mV
Lead Channel Sensing Intrinsic Amplitude: 31.625 mV
Lead Channel Setting Pacing Amplitude: 1.75 V
Lead Channel Setting Pacing Amplitude: 2.5 V
Lead Channel Setting Pacing Pulse Width: 0.4 ms
Lead Channel Setting Sensing Sensitivity: 0.3 mV
Zone Setting Status: 755011
Zone Setting Status: 755011
Zone Setting Status: 755011

## 2024-05-25 ENCOUNTER — Ambulatory Visit: Payer: Self-pay | Admitting: Cardiovascular Disease

## 2024-05-26 ENCOUNTER — Ambulatory Visit (HOSPITAL_COMMUNITY): Attending: Cardiology

## 2024-05-26 DIAGNOSIS — I5022 Chronic systolic (congestive) heart failure: Secondary | ICD-10-CM | POA: Diagnosis present

## 2024-05-30 DIAGNOSIS — I5022 Chronic systolic (congestive) heart failure: Secondary | ICD-10-CM | POA: Diagnosis not present

## 2024-06-18 ENCOUNTER — Ambulatory Visit (HOSPITAL_COMMUNITY): Payer: Self-pay | Admitting: Cardiology

## 2024-06-27 ENCOUNTER — Other Ambulatory Visit (HOSPITAL_COMMUNITY): Payer: Self-pay | Admitting: Cardiology

## 2024-06-27 ENCOUNTER — Telehealth (HOSPITAL_COMMUNITY): Payer: Self-pay

## 2024-06-27 DIAGNOSIS — I5022 Chronic systolic (congestive) heart failure: Secondary | ICD-10-CM

## 2024-06-27 NOTE — Telephone Encounter (Signed)
 Patient called in relation to recent blood work. Informed him to reduce potassium in diet and stop any supplements that contain potassium. Repeat blood work ordered and scheduled.

## 2024-07-04 NOTE — Progress Notes (Signed)
Remote ICD Transmission.

## 2024-07-17 ENCOUNTER — Other Ambulatory Visit: Payer: Self-pay

## 2024-07-21 ENCOUNTER — Ambulatory Visit (HOSPITAL_COMMUNITY)
Admission: RE | Admit: 2024-07-21 | Discharge: 2024-07-21 | Disposition: A | Discharge status: Home / Self Care | Source: Ambulatory Visit | Attending: Internal Medicine | Admitting: Internal Medicine

## 2024-07-21 ENCOUNTER — Other Ambulatory Visit: Payer: Self-pay | Admitting: Physician Assistant

## 2024-07-21 ENCOUNTER — Ambulatory Visit (HOSPITAL_COMMUNITY): Payer: Self-pay | Admitting: Family Medicine

## 2024-07-21 DIAGNOSIS — I5022 Chronic systolic (congestive) heart failure: Secondary | ICD-10-CM | POA: Diagnosis present

## 2024-07-21 LAB — BASIC METABOLIC PANEL WITH GFR
Anion gap: 10 (ref 5–15)
BUN: 23 mg/dL (ref 8–23)
CO2: 26 mmol/L (ref 22–32)
Calcium: 8.7 mg/dL — ABNORMAL LOW (ref 8.9–10.3)
Chloride: 103 mmol/L (ref 98–111)
Creatinine, Ser: 1.51 mg/dL — ABNORMAL HIGH (ref 0.61–1.24)
GFR, Estimated: 47 mL/min — ABNORMAL LOW (ref >60)
Glucose, Bld: 142 mg/dL — ABNORMAL HIGH (ref 70–99)
Potassium: 4.7 mmol/L (ref 3.5–5.1)
Sodium: 139 mmol/L (ref 135–145)

## 2024-07-24 MED ORDER — CARVEDILOL 3.125 MG PO TABS: 3.1250 mg | ORAL_TABLET | Freq: Two times a day (BID) | ORAL | 3 refills | Status: AC

## 2024-07-27 NOTE — Progress Notes (Signed)
 ADVANCED HEART FAILURE FOLLOW UP CLINIC NOTE  Referring Physician: No ref. provider found  Primary Care: Pcp, No Primary Cardiologist:  HPI: Steven Beard is a 78 y.o. male PMH of nonischemic cardiomyopathy, bypass surgery, orthostatic hypotension, left bundle branch block with biventricular ICD placement, h/o goiter s/p thyroid  resection, pulmonary embolism who presents for follow up of heart failure.      [place] and moved to [place] and established with Dr. Alveta in 2017.   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 First told he had a low EF in 2005 after gastric bypass. Was referred to Dr. Leverette. Did not have a cath at that time. Moved to [place] in 2017.  He was originally followed by Dr. Bensimhon for congestive heart failure, his main symptoms being hypotension and fatigue.  Given his profound orthostatic hypotension he underwent workup for TTR amyloid which was unremarkable.  He was started on midodrine  with improvement in his symptoms.  MRI showed ejection fraction 44% with minimal LGE and he overall has been doing well.   Patient was hospitalized for pulmonary embolism in 2024 and at that time was found to have a severely reduced ejection fraction along with new left bundle branch block.  A biventricular defibrillator was placed and he returned to [facility] for further follow-up.     SUBJECTIVE:  Patient overall states that he is doing fairly well. Still has some fatigue if standing for a long period of time, but otherwise completed cardiac rehab and has no recent complaints. BP and symptoms controlled on midodrine  5mg  TID. Discussed his echocardiogram results at length. We also had a long discussion about potential advanced therapies including LVAD. He is a fairly reasonable candidate at this point in time despite his age given his severely dilated LV, near normal RV function, maintained functional status. We discussed the natural  history of heart failure. He shared some hesitancy towards LVAD, but was encouraged to see his wife met a VAD patient in the waiting room and had a good experience.   PMH, current medications, allergies, social history, and family history reviewed in epic.  PHYSICAL EXAM: There were no vitals filed for this visit.   GENERAL: NAD  PULM:  Normal work of breathing, clear to auscultation bilaterally. Respirations are unlabored.  CARDIAC:  JVP: Not elevated         Normal rate with regular rhythm. No murmurs, rubs or gallops.  No lower extremity edema. Warm and well perfused extremities. ABDOMEN: Soft, non-tender, non-distended. NEUROLOGIC: Patient is oriented x3 with no focal or lateralizing neurologic deficits.    DATA REVIEW  ECG: Atrially sensed, biventricularly paced rhythm   ECHO: Echo 4/21: EF 30-35% RV ok.  BiV pacing reviewed and greater than 98%   Echo 2024: Reported from [place] , evidently EF now 20 to 25% with moderate LV dilation  03/12/24: LVEF 20-25%, severely dilated LV, mildly reduced RV function but normal size. Potential early LV thrombus   CATH: Cath 05/19/20: EF 25-35% Minimal nonobstructive CAD RA = 7 PA = 40/17 (26)  PCWP 15 CO/CI 7.8/3.4   cMRI 10/21: EF 44%, RV ok, very small area of subendocardial apical inferior LGE ? 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 NYHA class III and stable, intolerance  to GDMT on midodrine . Relatively good quality of life at the moment, planned CPX testing in the next few months. Long discussion as above about LVAD candidacy and potential advanced therapy options. He will discuss it over the next several months and will revisit when he is back from [place] .  - CPX to evaluate ongoing fatigue (bike), hopefully in the next few weeks to help assess his functional status as well as HF limitation - Inability to tolerate GDMT, continue midodrine  5mg  TID - Continue carvedilol  3.125mg  BID, and jardiance  10mg  daily  - Device checks arranged for here now, supoptimal response to BiV pacing   Orthostatic hypotension:  Improved with the addition of midodrine . -Continue midodrine  5 mg 3 times daily -Not on diuretics as above   Pulmonary embolism: Noted given worsening shortness of breath in [place] .  On anticoagulation. -Continue apixaban  2.5 mg twice daily, follow-up with hematology   Severe OSA - Sleep Study on 10/21 AHA 47.3/hr - compliant with CPAP - followed by Dr. Shlomo   DM2 - Per PCP   Morbid obesity s/p gastric bypass: -Continues on Ozempic  Follow up in 4 months, will arrange for VAD coordinator visit  Morene Brownie, MD Advanced Heart Failure Mechanical Circulatory Support 07/27/24

## 2024-07-28 ENCOUNTER — Encounter (HOSPITAL_COMMUNITY): Payer: Self-pay | Admitting: Cardiology

## 2024-07-28 ENCOUNTER — Ambulatory Visit (HOSPITAL_COMMUNITY)
Admission: RE | Admit: 2024-07-28 | Discharge: 2024-07-28 | Disposition: A | Discharge status: Home / Self Care | Source: Ambulatory Visit | Attending: Cardiology | Admitting: Cardiology

## 2024-07-28 VITALS — BP 116/72 | HR 71 | Ht 71.0 in | Wt 223.6 lb

## 2024-07-28 DIAGNOSIS — Z95810 Presence of automatic (implantable) cardiac defibrillator: Secondary | ICD-10-CM | POA: Insufficient documentation

## 2024-07-28 DIAGNOSIS — Z6831 Body mass index (BMI) 31.0-31.9, adult: Secondary | ICD-10-CM | POA: Diagnosis not present

## 2024-07-28 DIAGNOSIS — Z7984 Long term (current) use of oral hypoglycemic drugs: Secondary | ICD-10-CM | POA: Diagnosis not present

## 2024-07-28 DIAGNOSIS — I2699 Other pulmonary embolism without acute cor pulmonale: Secondary | ICD-10-CM | POA: Diagnosis not present

## 2024-07-28 DIAGNOSIS — I5022 Chronic systolic (congestive) heart failure: Secondary | ICD-10-CM | POA: Diagnosis present

## 2024-07-28 DIAGNOSIS — I447 Left bundle-branch block, unspecified: Secondary | ICD-10-CM | POA: Insufficient documentation

## 2024-07-28 DIAGNOSIS — Z7985 Long-term (current) use of injectable non-insulin antidiabetic drugs: Secondary | ICD-10-CM | POA: Diagnosis not present

## 2024-07-28 DIAGNOSIS — Z79899 Other long term (current) drug therapy: Secondary | ICD-10-CM | POA: Insufficient documentation

## 2024-07-28 DIAGNOSIS — E890 Postprocedural hypothyroidism: Secondary | ICD-10-CM | POA: Insufficient documentation

## 2024-07-28 DIAGNOSIS — I428 Other cardiomyopathies: Secondary | ICD-10-CM | POA: Insufficient documentation

## 2024-07-28 DIAGNOSIS — Z86711 Personal history of pulmonary embolism: Secondary | ICD-10-CM | POA: Diagnosis not present

## 2024-07-28 DIAGNOSIS — G4733 Obstructive sleep apnea (adult) (pediatric): Secondary | ICD-10-CM | POA: Diagnosis not present

## 2024-07-28 DIAGNOSIS — Z7901 Long term (current) use of anticoagulants: Secondary | ICD-10-CM | POA: Insufficient documentation

## 2024-07-28 DIAGNOSIS — I951 Orthostatic hypotension: Secondary | ICD-10-CM | POA: Insufficient documentation

## 2024-07-28 DIAGNOSIS — Z9884 Bariatric surgery status: Secondary | ICD-10-CM | POA: Diagnosis not present

## 2024-07-28 DIAGNOSIS — E119 Type 2 diabetes mellitus without complications: Secondary | ICD-10-CM | POA: Diagnosis not present

## 2024-07-28 DIAGNOSIS — M25559 Pain in unspecified hip: Secondary | ICD-10-CM | POA: Diagnosis not present

## 2024-07-28 NOTE — Patient Instructions (Signed)
 There has been no changes to your medications.  Your physician recommends that you schedule a follow-up appointment in: 6 months ( April 2026) ** PLEASE CALL THE OFFICE IN FEBRUARY 2026 TO ARRANGE YOUR FOLLOW UP APPOINTMENT.**  If you have any questions or concerns before your next appointment please send us  a message through [facility] [place] [facility] or call our office at [phone].    TO LEAVE A MESSAGE FOR THE NURSE SELECT OPTION 2, PLEASE LEAVE A MESSAGE INCLUDING: YOUR NAME DATE OF BIRTH CALL BACK NUMBER REASON FOR CALL**this is important as we prioritize the call backs  YOU WILL RECEIVE A CALL BACK THE SAME DAY AS LONG AS YOU CALL BEFORE 4:00 PM  At the [facility], you and your health needs are our priority. As part of our continuing mission to provide you with exceptional heart care, we have created designated Provider Care Teams. These Care Teams include your primary Cardiologist (physician) and Advanced Practice Providers (APPs- Physician Assistants and Nurse Practitioners) who all work together to provide you with the care you need, when you need it.   You may see any of the following providers on your designated Care Team at your next follow up: Dr Toribio Fuel Dr Ezra Shuck Dr. Ria Commander Dr. Morene Brownie Amy Lenetta, NP Caffie Shed, [place] [facility] [place], [place] Beckey Coe, NP [place] Lee, NP Ellouise Class, NP Tinnie Redman, PharmD Jaun Bash, PharmD   Please be sure to bring in all your medications bottles to every appointment.    Thank you for choosing [facility]

## 2024-08-07 ENCOUNTER — Encounter: Payer: Self-pay | Admitting: Internal Medicine

## 2024-08-18 ENCOUNTER — Ambulatory Visit

## 2024-08-18 DIAGNOSIS — I5022 Chronic systolic (congestive) heart failure: Secondary | ICD-10-CM | POA: Diagnosis not present

## 2024-08-19 LAB — CUP PACEART REMOTE DEVICE CHECK
Battery Remaining Longevity: 116 mo
Battery Voltage: 3 V
Brady Statistic AP VP Percent: 6.34 %
Brady Statistic AP VS Percent: 0.08 %
Brady Statistic AS VP Percent: 93.45 %
Brady Statistic AS VS Percent: 0.13 %
Brady Statistic RA Percent Paced: 6.43 %
Brady Statistic RV Percent Paced: 0 %
Date Time Interrogation Session: 20251110080753
HighPow Impedance: 69 Ohm
Implantable Lead Connection Status: 753985
Implantable Lead Connection Status: 753985
Implantable Lead Connection Status: 753985
Implantable Lead Implant Date: 20240710
Implantable Lead Implant Date: 20240710
Implantable Lead Implant Date: 20240710
Implantable Lead Location: 753858
Implantable Lead Location: 753859
Implantable Lead Location: 753860
Implantable Lead Model: 3830
Implantable Lead Model: 3830
Implantable Pulse Generator Implant Date: 20240710
Lead Channel Impedance Value: 209 Ohm
Lead Channel Impedance Value: 285 Ohm
Lead Channel Impedance Value: 418 Ohm
Lead Channel Impedance Value: 437 Ohm
Lead Channel Impedance Value: 532 Ohm
Lead Channel Impedance Value: 627 Ohm
Lead Channel Pacing Threshold Amplitude: 0.75 V
Lead Channel Pacing Threshold Pulse Width: 0.4 ms
Lead Channel Sensing Intrinsic Amplitude: 1.1 mV
Lead Channel Sensing Intrinsic Amplitude: 31.625 mV
Lead Channel Setting Pacing Amplitude: 1.75 V
Lead Channel Setting Pacing Amplitude: 2.5 V
Lead Channel Setting Pacing Pulse Width: 0.4 ms
Lead Channel Setting Sensing Sensitivity: 0.3 mV
Zone Setting Status: 755011
Zone Setting Status: 755011
Zone Setting Status: 755011

## 2024-08-20 ENCOUNTER — Ambulatory Visit: Payer: Self-pay | Admitting: Cardiovascular Disease

## 2024-08-21 NOTE — Progress Notes (Signed)
 Remote ICD Transmission

## 2024-11-17 ENCOUNTER — Encounter

## 2025-02-16 ENCOUNTER — Encounter

## 2025-05-18 ENCOUNTER — Encounter

## 2025-08-17 ENCOUNTER — Encounter

## 2025-11-16 ENCOUNTER — Encounter
